# Patient Record
Sex: Female | Born: 1950 | Race: White | Hispanic: No | Smoking: Former smoker
Health system: Southern US, Community
[De-identification: ages and names within clinical notes are randomized; demographics above are authoritative.]

## PROBLEM LIST (undated history)

## (undated) DIAGNOSIS — E119 Type 2 diabetes mellitus without complications: Secondary | ICD-10-CM

## (undated) DIAGNOSIS — Z85820 Personal history of malignant melanoma of skin: Secondary | ICD-10-CM

## (undated) DIAGNOSIS — H269 Unspecified cataract: Secondary | ICD-10-CM

## (undated) DIAGNOSIS — F329 Major depressive disorder, single episode, unspecified: Secondary | ICD-10-CM

## (undated) DIAGNOSIS — K219 Gastro-esophageal reflux disease without esophagitis: Secondary | ICD-10-CM

## (undated) DIAGNOSIS — Z85828 Personal history of other malignant neoplasm of skin: Secondary | ICD-10-CM

## (undated) DIAGNOSIS — E7800 Pure hypercholesterolemia, unspecified: Secondary | ICD-10-CM

## (undated) DIAGNOSIS — E039 Hypothyroidism, unspecified: Secondary | ICD-10-CM

## (undated) DIAGNOSIS — Z8679 Personal history of other diseases of the circulatory system: Secondary | ICD-10-CM

## (undated) DIAGNOSIS — G4734 Idiopathic sleep related nonobstructive alveolar hypoventilation: Secondary | ICD-10-CM

## (undated) DIAGNOSIS — F32A Depression, unspecified: Secondary | ICD-10-CM

## (undated) DIAGNOSIS — Z8659 Personal history of other mental and behavioral disorders: Secondary | ICD-10-CM

## (undated) DIAGNOSIS — R112 Nausea with vomiting, unspecified: Secondary | ICD-10-CM

## (undated) DIAGNOSIS — I10 Essential (primary) hypertension: Secondary | ICD-10-CM

## (undated) DIAGNOSIS — Z9889 Other specified postprocedural states: Secondary | ICD-10-CM

## (undated) DIAGNOSIS — F419 Anxiety disorder, unspecified: Secondary | ICD-10-CM

## (undated) DIAGNOSIS — E079 Disorder of thyroid, unspecified: Secondary | ICD-10-CM

## (undated) DIAGNOSIS — K589 Irritable bowel syndrome without diarrhea: Secondary | ICD-10-CM

## (undated) DIAGNOSIS — Z87898 Personal history of other specified conditions: Secondary | ICD-10-CM

## (undated) DIAGNOSIS — Z8639 Personal history of other endocrine, nutritional and metabolic disease: Secondary | ICD-10-CM

## (undated) HISTORY — PX: EYE SURGERY: SHX253

## (undated) HISTORY — PX: CEREBRAL ANEURYSM REPAIR: SHX164

## (undated) HISTORY — DX: Disorder of thyroid, unspecified: E07.9

## (undated) HISTORY — DX: Gastro-esophageal reflux disease without esophagitis: K21.9

## (undated) HISTORY — PX: TOTAL ABDOMINAL HYSTERECTOMY W/ BILATERAL SALPINGOOPHORECTOMY: SHX83

## (undated) HISTORY — DX: Personal history of other malignant neoplasm of skin: Z85.828

## (undated) HISTORY — DX: Personal history of other diseases of the circulatory system: Z86.79

## (undated) HISTORY — DX: Personal history of other diseases of the circulatory system: Z98.890

## (undated) HISTORY — DX: Personal history of other mental and behavioral disorders: Z86.59

## (undated) HISTORY — DX: Unspecified cataract: H26.9

## (undated) HISTORY — PX: ABDOMINAL HYSTERECTOMY: SHX81

## (undated) HISTORY — DX: Irritable bowel syndrome, unspecified: K58.9

## (undated) HISTORY — DX: Personal history of other endocrine, nutritional and metabolic disease: Z86.39

---
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
## 1990-01-16 HISTORY — PX: MOHS SURGERY: SUR867

## 1997-12-16 HISTORY — PX: OTHER SURGICAL HISTORY: SHX169

## 1998-02-16 HISTORY — PX: FOOT SURGERY: SHX648

## 1998-03-15 ENCOUNTER — Other Ambulatory Visit: Admission: RE | Admit: 1998-03-15 | Discharge: 1998-03-15 | Payer: Self-pay | Admitting: Family Medicine

## 2000-05-21 ENCOUNTER — Other Ambulatory Visit: Admission: RE | Admit: 2000-05-21 | Discharge: 2000-05-21 | Payer: Self-pay | Admitting: Family Medicine

## 2001-06-27 ENCOUNTER — Other Ambulatory Visit: Admission: RE | Admit: 2001-06-27 | Discharge: 2001-06-27 | Payer: Self-pay | Admitting: Family Medicine

## 2002-08-27 ENCOUNTER — Other Ambulatory Visit: Admission: RE | Admit: 2002-08-27 | Discharge: 2002-08-27 | Payer: Self-pay | Admitting: Family Medicine

## 2004-03-24 ENCOUNTER — Ambulatory Visit: Payer: Self-pay | Admitting: Family Medicine

## 2004-03-24 ENCOUNTER — Other Ambulatory Visit: Admission: RE | Admit: 2004-03-24 | Discharge: 2004-03-24 | Payer: Self-pay | Admitting: Family Medicine

## 2004-03-28 ENCOUNTER — Encounter: Payer: Self-pay | Admitting: Family Medicine

## 2004-04-28 ENCOUNTER — Ambulatory Visit: Payer: Self-pay | Admitting: Family Medicine

## 2004-07-05 ENCOUNTER — Ambulatory Visit: Payer: Self-pay | Admitting: Family Medicine

## 2004-07-27 ENCOUNTER — Ambulatory Visit: Payer: Self-pay | Admitting: Family Medicine

## 2004-09-09 ENCOUNTER — Ambulatory Visit: Payer: Self-pay | Admitting: Family Medicine

## 2004-11-07 ENCOUNTER — Ambulatory Visit: Payer: Self-pay | Admitting: *Deleted

## 2004-11-23 ENCOUNTER — Ambulatory Visit: Payer: Self-pay | Admitting: *Deleted

## 2004-12-02 ENCOUNTER — Ambulatory Visit: Payer: Self-pay | Admitting: *Deleted

## 2004-12-09 ENCOUNTER — Ambulatory Visit: Payer: Self-pay | Admitting: *Deleted

## 2004-12-16 ENCOUNTER — Ambulatory Visit: Payer: Self-pay | Admitting: *Deleted

## 2005-01-20 ENCOUNTER — Ambulatory Visit: Payer: Self-pay | Admitting: Family Medicine

## 2005-04-07 ENCOUNTER — Ambulatory Visit: Payer: Self-pay | Admitting: Family Medicine

## 2005-09-07 ENCOUNTER — Ambulatory Visit: Payer: Self-pay | Admitting: Family Medicine

## 2006-07-25 ENCOUNTER — Encounter: Payer: Self-pay | Admitting: Family Medicine

## 2006-07-25 DIAGNOSIS — Z85828 Personal history of other malignant neoplasm of skin: Secondary | ICD-10-CM | POA: Insufficient documentation

## 2006-07-25 DIAGNOSIS — K589 Irritable bowel syndrome without diarrhea: Secondary | ICD-10-CM | POA: Insufficient documentation

## 2006-07-25 DIAGNOSIS — R632 Polyphagia: Secondary | ICD-10-CM | POA: Insufficient documentation

## 2006-07-25 DIAGNOSIS — E039 Hypothyroidism, unspecified: Secondary | ICD-10-CM | POA: Insufficient documentation

## 2006-07-25 DIAGNOSIS — A0472 Enterocolitis due to Clostridium difficile, not specified as recurrent: Secondary | ICD-10-CM | POA: Insufficient documentation

## 2006-07-25 DIAGNOSIS — F411 Generalized anxiety disorder: Secondary | ICD-10-CM | POA: Insufficient documentation

## 2006-07-25 DIAGNOSIS — I10 Essential (primary) hypertension: Secondary | ICD-10-CM | POA: Insufficient documentation

## 2006-07-25 DIAGNOSIS — E1169 Type 2 diabetes mellitus with other specified complication: Secondary | ICD-10-CM | POA: Insufficient documentation

## 2006-07-25 DIAGNOSIS — E785 Hyperlipidemia, unspecified: Secondary | ICD-10-CM | POA: Insufficient documentation

## 2006-07-25 DIAGNOSIS — F418 Other specified anxiety disorders: Secondary | ICD-10-CM | POA: Insufficient documentation

## 2006-08-01 ENCOUNTER — Ambulatory Visit: Payer: Self-pay | Admitting: Family Medicine

## 2006-08-03 LAB — CONVERTED CEMR LAB
ALT: 31 units/L (ref 0–35)
AST: 24 units/L (ref 0–37)
Alkaline Phosphatase: 51 units/L (ref 39–117)
BUN: 10 mg/dL (ref 6–23)
Basophils Relative: 0.7 % (ref 0.0–1.0)
Bilirubin, Direct: 0.1 mg/dL (ref 0.0–0.3)
CO2: 31 meq/L (ref 19–32)
Calcium: 9.2 mg/dL (ref 8.4–10.5)
Chloride: 105 meq/L (ref 96–112)
Cholesterol: 256 mg/dL (ref 0–200)
Eosinophils Relative: 2 % (ref 0.0–5.0)
GFR calc Af Amer: 111 mL/min
Glucose, Bld: 102 mg/dL — ABNORMAL HIGH (ref 70–99)
HDL: 42.9 mg/dL (ref >39.0)
Monocytes Relative: 4.9 % (ref 3.0–11.0)
Platelets: 276 10*3/uL (ref 150–400)
RBC: 4.41 M/uL (ref 3.87–5.11)
RDW: 13.5 % (ref 11.5–14.6)
TSH: 28.56 microintl units/mL — ABNORMAL HIGH (ref 0.35–5.50)
Total CHOL/HDL Ratio: 6
Total Protein: 7.5 g/dL (ref 6.0–8.3)
Triglycerides: 119 mg/dL (ref 0–149)
VLDL: 24 mg/dL (ref 0–40)
WBC: 6.7 10*3/uL (ref 4.5–10.5)

## 2006-08-05 ENCOUNTER — Emergency Department: Payer: Self-pay | Admitting: Unknown Physician Specialty

## 2006-08-05 ENCOUNTER — Other Ambulatory Visit: Payer: Self-pay

## 2006-08-08 ENCOUNTER — Telehealth: Payer: Self-pay | Admitting: Family Medicine

## 2006-08-09 ENCOUNTER — Telehealth (INDEPENDENT_AMBULATORY_CARE_PROVIDER_SITE_OTHER): Payer: Self-pay | Admitting: *Deleted

## 2006-08-09 DIAGNOSIS — I671 Cerebral aneurysm, nonruptured: Secondary | ICD-10-CM | POA: Insufficient documentation

## 2006-08-22 ENCOUNTER — Encounter: Payer: Self-pay | Admitting: Family Medicine

## 2006-08-28 ENCOUNTER — Encounter (INDEPENDENT_AMBULATORY_CARE_PROVIDER_SITE_OTHER): Payer: Self-pay | Admitting: *Deleted

## 2006-12-19 ENCOUNTER — Ambulatory Visit: Payer: Self-pay | Admitting: Family Medicine

## 2007-01-14 ENCOUNTER — Ambulatory Visit: Payer: Self-pay | Admitting: Family Medicine

## 2007-01-14 LAB — CONVERTED CEMR LAB: KOH Prep: NEGATIVE

## 2007-01-15 LAB — CONVERTED CEMR LAB
Cholesterol: 235 mg/dL (ref 0–200)
Direct LDL: 160.7 mg/dL
HDL: 39.8 mg/dL (ref >39.0)
Hgb A1c MFr Bld: 5.9 % (ref 4.6–6.0)
TSH: 14.06 microintl units/mL — ABNORMAL HIGH (ref 0.35–5.50)
Triglycerides: 193 mg/dL — ABNORMAL HIGH (ref 0–149)
VLDL: 39 mg/dL (ref 0–40)

## 2007-02-21 ENCOUNTER — Ambulatory Visit: Payer: Self-pay | Admitting: Family Medicine

## 2007-03-25 ENCOUNTER — Ambulatory Visit: Payer: Self-pay | Admitting: Family Medicine

## 2007-03-26 LAB — CONVERTED CEMR LAB
HDL: 44.2 mg/dL (ref >39.0)
TSH: 5.83 microintl units/mL — ABNORMAL HIGH (ref 0.35–5.50)
Total CHOL/HDL Ratio: 3.9
Triglycerides: 124 mg/dL (ref 0–149)

## 2007-05-22 ENCOUNTER — Ambulatory Visit: Payer: Self-pay | Admitting: Family Medicine

## 2007-05-22 DIAGNOSIS — Z87891 Personal history of nicotine dependence: Secondary | ICD-10-CM | POA: Insufficient documentation

## 2007-05-22 DIAGNOSIS — E119 Type 2 diabetes mellitus without complications: Secondary | ICD-10-CM | POA: Insufficient documentation

## 2007-09-26 ENCOUNTER — Ambulatory Visit: Payer: Self-pay | Admitting: Family Medicine

## 2007-09-27 LAB — CONVERTED CEMR LAB: TSH: 0.19 microintl units/mL — ABNORMAL LOW (ref 0.35–5.50)

## 2007-10-14 ENCOUNTER — Telehealth: Payer: Self-pay | Admitting: Family Medicine

## 2007-10-30 ENCOUNTER — Ambulatory Visit: Payer: Self-pay | Admitting: Family Medicine

## 2007-11-01 ENCOUNTER — Telehealth: Payer: Self-pay | Admitting: Family Medicine

## 2008-03-16 ENCOUNTER — Ambulatory Visit: Payer: Self-pay | Admitting: Family Medicine

## 2008-03-17 LAB — CONVERTED CEMR LAB
ALT: 34 units/L (ref 0–35)
AST: 27 units/L (ref 0–37)
Albumin: 4 g/dL (ref 3.5–5.2)
BUN: 23 mg/dL (ref 6–23)
CO2: 30 meq/L (ref 19–32)
Chloride: 104 meq/L (ref 96–112)
Cholesterol: 183 mg/dL (ref 0–200)
Creatinine, Ser: 0.7 mg/dL (ref 0.4–1.2)
GFR calc Af Amer: 110 mL/min
Glucose, Bld: 133 mg/dL — ABNORMAL HIGH (ref 70–99)
HDL: 36.6 mg/dL — ABNORMAL LOW (ref >39.0)
TSH: 2.29 microintl units/mL (ref 0.35–5.50)
Triglycerides: 178 mg/dL — ABNORMAL HIGH (ref 0–149)

## 2008-03-23 ENCOUNTER — Ambulatory Visit: Payer: Self-pay | Admitting: Family Medicine

## 2008-04-22 ENCOUNTER — Telehealth: Payer: Self-pay | Admitting: Family Medicine

## 2008-07-09 ENCOUNTER — Ambulatory Visit: Payer: Self-pay | Admitting: Family Medicine

## 2008-07-12 LAB — CONVERTED CEMR LAB
Albumin: 4.1 g/dL (ref 3.5–5.2)
Creatinine,U: 306.6 mg/dL
Glucose, Bld: 122 mg/dL — ABNORMAL HIGH (ref 70–99)
Hgb A1c MFr Bld: 6.1 % (ref 4.6–6.5)
Phosphorus: 3.8 mg/dL (ref 2.3–4.6)
Potassium: 3.8 meq/L (ref 3.5–5.1)
Sodium: 142 meq/L (ref 135–145)

## 2008-07-15 ENCOUNTER — Ambulatory Visit: Payer: Self-pay | Admitting: Family Medicine

## 2008-07-15 DIAGNOSIS — R61 Generalized hyperhidrosis: Secondary | ICD-10-CM | POA: Insufficient documentation

## 2008-08-07 ENCOUNTER — Telehealth: Payer: Self-pay | Admitting: Family Medicine

## 2008-08-10 ENCOUNTER — Ambulatory Visit: Payer: Self-pay | Admitting: Family Medicine

## 2008-08-10 ENCOUNTER — Encounter: Admission: RE | Admit: 2008-08-10 | Discharge: 2008-08-10 | Payer: Self-pay | Admitting: Family Medicine

## 2008-08-10 DIAGNOSIS — M545 Low back pain, unspecified: Secondary | ICD-10-CM | POA: Insufficient documentation

## 2008-08-10 DIAGNOSIS — IMO0002 Reserved for concepts with insufficient information to code with codable children: Secondary | ICD-10-CM | POA: Insufficient documentation

## 2008-08-26 ENCOUNTER — Encounter: Payer: Self-pay | Admitting: Family Medicine

## 2008-08-27 ENCOUNTER — Encounter: Payer: Self-pay | Admitting: Family Medicine

## 2008-08-28 ENCOUNTER — Encounter: Payer: Self-pay | Admitting: Family Medicine

## 2008-08-31 ENCOUNTER — Encounter: Payer: Self-pay | Admitting: Family Medicine

## 2008-09-09 ENCOUNTER — Telehealth: Payer: Self-pay | Admitting: Family Medicine

## 2008-09-23 ENCOUNTER — Ambulatory Visit: Payer: Self-pay | Admitting: Family Medicine

## 2008-10-01 ENCOUNTER — Ambulatory Visit: Payer: Self-pay | Admitting: Family Medicine

## 2008-10-01 DIAGNOSIS — Z780 Asymptomatic menopausal state: Secondary | ICD-10-CM | POA: Insufficient documentation

## 2008-10-09 ENCOUNTER — Encounter: Payer: Self-pay | Admitting: Family Medicine

## 2008-10-15 ENCOUNTER — Encounter (INDEPENDENT_AMBULATORY_CARE_PROVIDER_SITE_OTHER): Payer: Self-pay | Admitting: *Deleted

## 2008-11-11 ENCOUNTER — Encounter (INDEPENDENT_AMBULATORY_CARE_PROVIDER_SITE_OTHER): Payer: Self-pay | Admitting: *Deleted

## 2008-12-28 ENCOUNTER — Ambulatory Visit: Payer: Self-pay | Admitting: Family Medicine

## 2008-12-29 LAB — CONVERTED CEMR LAB
ALT: 23 units/L (ref 0–35)
AST: 19 units/L (ref 0–37)
Albumin: 3.9 g/dL (ref 3.5–5.2)
BUN: 18 mg/dL (ref 6–23)
CO2: 29 meq/L (ref 19–32)
Calcium: 9.4 mg/dL (ref 8.4–10.5)
Chloride: 106 meq/L (ref 96–112)
Cholesterol: 184 mg/dL (ref 0–200)
Creatinine, Ser: 0.8 mg/dL (ref 0.4–1.2)
GFR calc non Af Amer: 77.76 mL/min (ref >60)
Glucose, Bld: 163 mg/dL — ABNORMAL HIGH (ref 70–99)
HDL: 45.2 mg/dL (ref >39.00)
Hgb A1c MFr Bld: 5.9 % (ref 4.6–6.5)
LDL Cholesterol: 117 mg/dL — ABNORMAL HIGH (ref 0–99)
Phosphorus: 4 mg/dL (ref 2.3–4.6)
Potassium: 3.6 meq/L (ref 3.5–5.1)
Sodium: 146 meq/L — ABNORMAL HIGH (ref 135–145)
TSH: 0.9 microintl units/mL (ref 0.35–5.50)
Total CHOL/HDL Ratio: 4
Triglycerides: 107 mg/dL (ref 0.0–149.0)
VLDL: 21.4 mg/dL (ref 0.0–40.0)

## 2009-01-13 ENCOUNTER — Ambulatory Visit: Payer: Self-pay | Admitting: Family Medicine

## 2009-01-13 DIAGNOSIS — E669 Obesity, unspecified: Secondary | ICD-10-CM

## 2009-01-13 DIAGNOSIS — E663 Overweight: Secondary | ICD-10-CM | POA: Insufficient documentation

## 2009-01-13 DIAGNOSIS — E6609 Other obesity due to excess calories: Secondary | ICD-10-CM | POA: Insufficient documentation

## 2009-02-11 ENCOUNTER — Telehealth: Payer: Self-pay | Admitting: Family Medicine

## 2009-06-02 ENCOUNTER — Telehealth: Payer: Self-pay | Admitting: Family Medicine

## 2009-06-23 ENCOUNTER — Encounter: Payer: Self-pay | Admitting: Family Medicine

## 2009-07-09 ENCOUNTER — Ambulatory Visit: Payer: Self-pay | Admitting: Family Medicine

## 2009-07-10 LAB — CONVERTED CEMR LAB
ALT: 19 units/L (ref 0–35)
Albumin: 4.5 g/dL (ref 3.5–5.2)
BUN: 13 mg/dL (ref 6–23)
Glucose, Bld: 109 mg/dL — ABNORMAL HIGH (ref 70–99)
HDL: 41.3 mg/dL (ref >39.00)
Phosphorus: 4 mg/dL (ref 2.3–4.6)
Total CHOL/HDL Ratio: 3
VLDL: 16.4 mg/dL (ref 0.0–40.0)

## 2009-08-04 ENCOUNTER — Ambulatory Visit: Payer: Self-pay | Admitting: Family Medicine

## 2009-09-13 ENCOUNTER — Telehealth: Payer: Self-pay | Admitting: Family Medicine

## 2009-09-13 DIAGNOSIS — G4730 Sleep apnea, unspecified: Secondary | ICD-10-CM | POA: Insufficient documentation

## 2009-11-05 ENCOUNTER — Encounter: Payer: Self-pay | Admitting: Family Medicine

## 2010-01-04 ENCOUNTER — Ambulatory Visit: Payer: Self-pay | Admitting: Family Medicine

## 2010-01-04 LAB — CONVERTED CEMR LAB
ALT: 21 units/L (ref 0–35)
AST: 20 units/L (ref 0–37)
BUN: 27 mg/dL — ABNORMAL HIGH (ref 6–23)
Creatinine, Ser: 0.8 mg/dL (ref 0.4–1.2)
GFR calc non Af Amer: 77.5 mL/min (ref >60.00)
Hgb A1c MFr Bld: 5.5 % (ref 4.6–6.5)
LDL Cholesterol: 90 mg/dL (ref 0–99)
Phosphorus: 3 mg/dL (ref 2.3–4.6)
Total CHOL/HDL Ratio: 3

## 2010-01-12 ENCOUNTER — Ambulatory Visit: Payer: Self-pay | Admitting: Family Medicine

## 2010-02-15 NOTE — Assessment & Plan Note (Signed)
Summary: 6 MONTH FOLLOW UP/RBH   Vital Signs:  Patient profile:   60 year old female Height:      61.75 inches Weight:      178.75 pounds BMI:     33.08 Temp:     98 degrees F oral Pulse rate:   68 / minute Pulse rhythm:   regular BP sitting:   110 / 72  (left arm) Cuff size:   large  Vitals Entered By: Lewanda Rife LPN (August 04, 2009 4:11 PM) CC: six month f/u after labs   History of Present Illness: here for f/u of HTN and lipids and hyperglycemia   has lost 32 lb since last visit 6 months ago! started eating healthy with her daughter -- more fruit and veg  no more junk food  feels better  by her scales 40 lb total  feels like a new person -- also has a new part time job at [employer]    has really found balance  is not exercising yet   does try to park farther so she will walk   today is 3 year anniv of smoking cessation  watching her wide neck aneurysm -- tried to stent it (unsuccessful)- but was able to coil it and tried again     bp is great at 110/72  lipids are better with trig 82 and HDL 41 and LDL 85 (down frm 117)  AIC is 5.5 down from 5.9  is no longer caring for her mother - was in rehab and now home takes her to dialysis 3 times per week   was told she may need a sleep study - got hypoxic during angiogram  does snore - to the best of her knowledge- wakes her self up and throat is dry  wakes up refreshed       Allergies: 1)  Codeine  Past History:  Past Medical History: Last updated: 03/23/2008 Anxiety Depression Hyperlipidemia Hypertension Hypothyroidism Skin cancer, hx of- melanoma left lower arm, recurrance  cerebral aneurysm quit smoking 08 hyperglycemia (? early DM)  Past Surgical History: Last updated: 12/19/2006 Appendectomy Hysterectomy/ BSO Tonsillectomy Pos EKG (12/1997) Cardiolite- normal EF 68% (12/1997 Eye surgery RR Foot surgery- bilateral (02/1998) Dexa- normal (05/2000) Colonoscopy- normal (05/2002) CT  sinus- left sinusitis (10/2002) Thyroid US- neg (03/206) cerebral aneurysm  Family History: Last updated: 10/01/2008 Father: diabetes, depression, CAD Mother: diabetes, diverticulosis, adrenal problems Siblings:  breast ca in distant relatives- some of her aunts and cousins - all on mother's side  sister cerebral aneurysm  Social History: Last updated: 10/01/2008 Marital Status: divorced  Children: 1 daughter 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 Eyes:  Denies blurring and eye irritation. CV:  Denies chest pain or discomfort, lightheadness, palpitations, and shortness of breath with exertion. Resp:  Denies cough, shortness of breath, and wheezing. GI:  Denies abdominal pain, change in bowel habits, indigestion, and nausea. GU:  Denies urinary frequency. MS:  Denies muscle aches and cramps. Derm:  Denies itching, lesion(s), poor wound healing, and rash. Neuro:  Denies numbness and tingling. Psych:  mood is better - had some med changes for that. Endo:  Denies cold intolerance, excessive thirst, excessive urination, and heat intolerance. Heme:  Denies abnormal bruising and bleeding.  Physical Exam  General:  overweight but generally well appearing moderate wt loss noted  Head:  normocephalic, atraumatic, and no  abnormalities observed.   Eyes:  vision grossly intact, pupils equal, pupils round, and pupils reactive to light.  .conj Mouth:  pharynx pink and moist.   Neck:  supple with full rom and no masses or thyromegally, no JVD or carotid bruit  Chest Wall:  No deformities, masses, or tenderness noted. Lungs:  Normal respiratory effort, chest expands symmetrically. Lungs are clear to auscultation, no crackles or wheezes. Heart:  Normal rate and regular rhythm. S1 and S2 normal without gallop, murmur, click, rub or other extra  sounds. Abdomen:  Bowel sounds positive,abdomen soft and non-tender without masses, organomegaly or hernias noted. no renal bruits  Msk:  No deformity or scoliosis noted of thoracic or lumbar spine.   Pulses:  R and L carotid,radial,femoral,dorsalis pedis and posterior tibial pulses are full and equal bilaterally Extremities:  No clubbing, cyanosis, edema, or deformity noted with normal full range of motion of all joints.   Neurologic:  sensation intact to light touch, gait normal, and DTRs symmetrical and normal.   Skin:  Intact without suspicious lesions or rashes Cervical Nodes:  No lymphadenopathy noted Inguinal Nodes:  No significant adenopathy Psych:  normal affect, talkative and pleasant - is quite cheerful today  Diabetes Management Exam:    Foot Exam (with socks and/or shoes not present):       Sensory-Pinprick/Light touch:          Left medial foot (L-4): normal          Left dorsal foot (L-5): normal          Left lateral foot (S-1): normal          Right medial foot (L-4): normal          Right dorsal foot (L-5): normal          Right lateral foot (S-1): normal       Sensory-Monofilament:          Left foot: normal          Right foot: normal       Inspection:          Left foot: normal          Right foot: normal       Nails:          Left foot: normal          Right foot: normal   Impression & Recommendations:  Problem # 1:  HYPERGLYCEMIA (ICD-790.29) Assessment Improved  this continues to improve with AIC 5.5 and 40 lb wt loss  enc her to keep up the good work made plan to start some indoor exercise   Labs Reviewed: Creat: 0.8 (07/09/2009)     Problem # 2:  OBESITY (ICD-278.00) Assessment: Improved much imp with sensible eating and loss of 40 lb enc her to keep up the great work  Problem # 3:  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 Take 1 by mouth once daily    Hydrochlorothiazide 25 Mg Tabs (Hydrochlorothiazide) .Marland Kitchen... Take one by mouth daily  BP today: 110/72 Prior BP: 120/80 (01/13/2009)  Labs Reviewed: K+: 4.8 (07/09/2009) Creat: : 0.8 (07/09/2009)   Chol: 143 (07/09/2009)   HDL:  41.30 (07/09/2009)   LDL: 85 (07/09/2009)   TG: 82.0 (07/09/2009)  Problem # 5:  HYPERLIPIDEMIA (ICD-272.4) Assessment: Improved  this is imp / at goal with LDL uner 100  rev low sat fat diet - much better continue the pravachol  plan lab and f/u 6 mo  Her updated medication list for this problem includes:    Pravachol 20 Mg Tabs (Pravastatin sodium) .Marland Kitchen... 1 by mouth once daily  Labs Reviewed: SGOT: 25 (07/09/2009)   SGPT: 19 (07/09/2009)   HDL:41.30 (07/09/2009), 45.20 (12/28/2008)  LDL:85 (07/09/2009), 117 (16/10/9602)  Chol:143 (07/09/2009), 184 (12/28/2008)  Trig:82.0 (07/09/2009), 107.0 (12/28/2008)  Complete Medication List: 1)  Inderal La 80 Mg Cp24 (Propranolol hcl) .... Take 1 by mouth once daily 2)  Hydrochlorothiazide 25 Mg Tabs (Hydrochlorothiazide) .... Take one by mouth daily 3)  Lexapro 20 Mg Tabs (Escitalopram oxalate) .... Take one and one half q am 4)  Wellbutrin Xl 300 Mg Tb24 (Bupropion hcl) .... Take one by mouth daily with wellbutrin xl 150mg  to total 450mg  daily 5)  Levoxyl 150 Mcg Tabs (Levothyroxine sodium) .... One by mouth daily 6)  Pravachol 20 Mg Tabs (Pravastatin sodium) .Marland Kitchen.. 1 by mouth once daily 7)  Aspirin 325 Mg Tabs (Aspirin) .... Take one by mouth daily 8)  Diclofenac Sodium 75 Mg Tbec (Diclofenac sodium) .... Takle 1 by mouth two times a day as needed. 9)  Plavix 75 Mg Tabs (Clopidogrel bisulfate) .... Take 1 tablet by mouth once a day 10)  Wellbutrin Sr 150 Mg Xr12h-tab  (Bupropion hcl) .... Take one by mouth daily with wellbutrin xl 300mg  to total 450mg  daily  Patient Instructions: 1)  weight loss is great  2)  cholesterol and bp and sugar control are all improved  3)  keep up the good work and start adding in some exercise (like an indoor video)  4)  schedule fasting labs and then follow up in 6 months lipid/ast/alt/renal / AIC/ tsh 272, hyperglycemia , 244.9  Current Allergies (reviewed today): CODEINE

## 2010-02-15 NOTE — Consult Note (Signed)
Summary: [facility]  [facility]   Imported By: Maryln Gottron 11/18/2009 14:43:33  _____________________________________________________________________  External Attachment:    Type:   Image     Comment:   External Document

## 2010-02-15 NOTE — Progress Notes (Signed)
Summary: refill requests from [facility]  Phone Note Refill Request Message from:  Fax from Pharmacy  Refills Requested: Medication #1:  INDERAL LA 80 MG  CP24 take 1 by mouth once daily  Medication #2:  HYDROCHLOROTHIAZIDE 25 MG  TABS take one by mouth daily  Medication #3:  LEVOXYL 150 MCG TABS one by mouth daily Faxed forms from [facility] are on your shelf.  Initial call taken by: Lowella Petties CMA,  Jun 02, 2009 11:59 AM  Follow-up for Phone Call        form done and in nurse in box   Follow-up by: Judith Part MD,  Jun 02, 2009 1:27 PM  Additional Follow-up for Phone Call Additional follow up Details #1::        completed forms faxed to [phone] as instructed. Forms are at Rena's desk if needed later.Lewanda Rife LPN  Jun 02, 2009 4:02 PM     New/Updated Medications: INDERAL LA 80 MG  CP24 (PROPRANOLOL HCL) take 1 by mouth once daily Prescriptions: LEVOXYL 150 MCG TABS (LEVOTHYROXINE SODIUM) one by mouth daily  #90 x 3   Entered and Authorized by:   Judith Part MD   Signed by:   Lewanda Rife LPN on 09/81/1914   Method used:   Historical   RxID:   [id number] HYDROCHLOROTHIAZIDE 25 MG  TABS (HYDROCHLOROTHIAZIDE) take one by mouth daily  #90 x 3   Entered and Authorized by:   Judith Part MD   Signed by:   Lewanda Rife LPN on 69/62/9528   Method used:   Historical   RxID:   [id number] INDERAL LA 80 MG  CP24 (PROPRANOLOL HCL) take 1 by mouth once daily  #90 x 3   Entered and Authorized by:   Judith Part MD   Signed by:   Lewanda Rife LPN on 44/03/4740   Method used:   Historical   RxID:   [id number]

## 2010-02-15 NOTE — Progress Notes (Signed)
Summary: rt hip pain  Phone Note Call from Patient Call back at [phone]   Caller: Patient Call For: Dr. Patsy Lager Summary of Call: Right hip pain is much better than when saw Dr Patsy Lager on 08/10/08. Pt fell on rt hip in October and pt did not get checked but rt hip is starting to bother her more and pt has refill on Diclofenac Na which worked well for pt. but  pt. wanted to ask Dr. Patsy Lager if a new med had become available since she was given the Diclofenac NA in July that might work faster. [facility] uses [facility]. [phone] needed. Please advise.  Initial call taken by: Lewanda Rife LPN,  February 11, 2009 9:38 AM  Follow-up for Phone Call        many meds on the market. none new that i would use ahead of the 59 old ones that work well.  ok to call in Voltaren 75 mg, 1 by mouth two times a day, #60, 2 refills  f/u if not better Follow-up by: Hannah Beat MD,  February 11, 2009 9:45 AM  Additional Follow-up for Phone Call Additional follow up Details #1::        Med sent [facility] to [facility] and Patient notified as instructed by telephone. Lewanda Rife LPN  February 11, 2009 10:46 AM     New/Updated Medications: DICLOFENAC SODIUM 75 MG TBEC (DICLOFENAC SODIUM) takle 1 by mouth two times a day Prescriptions: DICLOFENAC SODIUM 75 MG TBEC (DICLOFENAC SODIUM) takle 1 by mouth two times a day  #60 x 2   Entered by:   Lewanda Rife LPN   Authorized by:   Hannah Beat MD   Signed by:   Lewanda Rife LPN on 54/27/0623   Method used:   Electronically to        [facility] [phone]* (retail)       [address] [place], [place]  [address]       Ph: [phone] or [phone]       Fax: [phone]   RxID:   [id number]

## 2010-02-15 NOTE — Progress Notes (Signed)
Summary: sleep study referral  Phone Note Call from Patient Call back at Home Phone [phone]   Caller: Patient Call For: Judith Part MD Summary of Call: Patient is calling stating that she had discussed w/ you at last appt. having a sleep study and that she hasn't heard anything back. Please advise. Prefers [employer].  Initial call taken by: Melody Comas,  September 13, 2009 11:15 AM  Follow-up for Phone Call        thank her for calling back we disc so many issues that day the ref did not get done - my fault  will do ref now for [facility] Follow-up by: Judith Part MD,  September 13, 2009 11:37 AM  Additional Follow-up for Phone Call Additional follow up Details #1::        Patient notified as instructed by telephone. Pt will wait to hear from [facility] about appt.Lewanda Rife LPN  September 13, 2009 12:38 PM   New Problems: SLEEP APNEA (ICD-780.57)   Additional Follow-up for Phone Call Additional follow up Details #2::    ApptDr Leveda Anna Pulm on 09/23/2009 at 11:00am. Follow-up by: Carlton Adam,  September 15, 2009 11:03 AM  New Problems: SLEEP APNEA (ICD-780.57)

## 2010-02-17 NOTE — Assessment & Plan Note (Signed)
Summary: ROA FOR 6 MONTH FOLLOW-UP/JRR   Vital Signs:  Patient profile:   60 year old female Height:      61.75 inches Weight:      191.75 pounds BMI:     35.48 Temp:     97.8 degrees F oral Pulse rate:   72 / minute Pulse rhythm:   regular BP sitting:   112 / 72  (left arm) Cuff size:   large  Vitals Entered By: Lewanda Rife LPN (January 12, 2010 9:03 AM) CC: six month f/u   History of Present Illness: here for 6 month f/u of hyperglycemia and lipids and HTN and hypothyroidism  is glad the holidays are over  feels great  saw Dr Mayo Ao and did sleep study  he put her on 02 at night -- and feels better  no apnea and also nl pfts   has f/u for her aneurysm -- coiling -- done more (unable to use stent) has f/u in 2 weeks   wt is up 13 lb with bmi of 35 thinks real wt is 186  got off track over the holidays -- and then had some bad headaches after the coiling -- thank goodness that is better now  had to spend a few weeks in bed  is not back on track with diet - but made a plan  is cleared to exercise -- after her next check up - plans to walk   is doing so much better with emotional eating -- with more energy  doing more so she does not eat  was a boredom eater  also resolved issues with her ex husband   bp great control at 112/72  tsh is stable  hypothyroid  lipids are good with trig 48 and HDL 52 and LDL 90  (HDL is up )  AIC is 5.5-- this is stable from last time  Allergies: 1)  Codeine  Past History:  Past Surgical History: Last updated: 12/19/2006 Appendectomy Hysterectomy/ BSO Tonsillectomy Pos EKG (12/1997) Cardiolite- normal EF 68% (12/1997 Eye surgery RR Foot surgery- bilateral (02/1998) Dexa- normal (05/2000) Colonoscopy- normal (05/2002) CT sinus- left sinusitis (10/2002) Thyroid US- neg (03/206) cerebral aneurysm  Family History: Last updated: 10/01/2008 Father: diabetes, depression, CAD Mother: diabetes, diverticulosis, adrenal  problems Siblings:  breast ca in distant relatives- some of her aunts and cousins - all on mother's side  sister cerebral aneurysm  Social History: Last updated: 01/12/2010 Marital Status: divorced  Children: 1 daughter 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 Eyes:  Denies blurring and eye irritation. CV:  Denies chest pain or discomfort and palpitations. Resp:  Denies cough, shortness of breath, and wheezing. GI:  Denies abdominal pain, change in bowel habits, and indigestion. GU:  Denies dysuria and urinary frequency. MS:  Denies joint pain. Derm:  Denies itching, lesion(s), poor wound healing, and rash. Neuro:  Complains of headaches; denies numbness and tingling. Psych:  Denies anxiety and depression. Endo:  Denies cold intolerance, excessive  thirst, excessive urination, and heat intolerance. Heme:  Denies abnormal bruising and bleeding.  Physical Exam  General:  overweight but generally well appearing moderate wt loss noted  Head:  normocephalic, atraumatic, and no abnormalities observed.   Eyes:  vision grossly intact, pupils equal, pupils round, and pupils reactive to light.   Mouth:  pharynx pink and moist.   Neck:  supple with full rom and no masses or thyromegally, no JVD or carotid bruit  Chest Wall:  No deformities, masses, or tenderness noted. Lungs:  Normal respiratory effort,  chest expands symmetrically. Lungs are clear to auscultation, no crackles or wheezes. Heart:  Normal rate and regular rhythm. S1 and S2 normal without gallop, murmur, click, rub or other extra sounds. Abdomen:  Bowel sounds positive,abdomen soft and non-tender without masses, organomegaly or hernias noted. no renal bruits  Msk:  No deformity or scoliosis noted of thoracic or lumbar spine.   Extremities:  No clubbing, cyanosis, edema, or deformity noted with normal full range of motion of all joints.   Neurologic:  sensation intact to light touch, gait normal, and DTRs symmetrical and normal.   Skin:  Intact without suspicious lesions or rashes Cervical Nodes:  No lymphadenopathy noted Inguinal Nodes:  No significant adenopathy Psych:  normal affect, talkative and pleasant    Impression & Recommendations:  Problem # 1:  HYPERGLYCEMIA (ICD-790.29) Assessment Unchanged  this is stable despite wt gain disc healthy diet (low simple sugar/ choose complex carbs/ low sat fat) diet and exercise in detail  lab and PE in 6 mo planned  Labs Reviewed: Creat: 0.8 (01/04/2010)     Problem # 2:  CEREBRAL ANEURYSM (ICD-437.3) Assessment: Comment Only doing well after 2nd coiling  will be released to exercise soon  Problem # 3:  HYPOTHYROIDISM (ICD-244.9) Assessment: Unchanged  no clinical changes and stable tsh  no change in dose Her updated medication list for this problem includes:    Levoxyl 150 Mcg Tabs (Levothyroxine sodium) ..... One by mouth daily  Labs Reviewed: TSH: 0.40 (01/04/2010)    HgBA1c: 5.5 (01/04/2010) Chol: 152 (01/04/2010)   HDL: 52.00 (01/04/2010)   LDL: 90 (01/04/2010)   TG: 48.0 (01/04/2010)  Problem # 4:  HYPERLIPIDEMIA (ICD-272.4) Assessment: Unchanged  this is well controlled with pravachol and diet  rev low sat fat diet  Her updated medication list for this problem includes:    Pravachol 20 Mg Tabs (Pravastatin sodium) .Marland Kitchen... 1 by mouth once daily  Labs  Reviewed: SGOT: 20 (01/04/2010)   SGPT: 21 (01/04/2010)   HDL:52.00 (01/04/2010), 41.30 (07/09/2009)  LDL:90 (01/04/2010), 85 (07/09/2009)  Chol:152 (01/04/2010), 143 (07/09/2009)  Trig:48.0 (01/04/2010), 82.0 (07/09/2009)  Problem # 5:  DEPRESSION (ICD-311) Assessment: Improved doing very well with this medication - great attitude and less emotional eating will continue psychiatry f/u and also current meds  Her updated medication list for this problem includes:    Lexapro 20 Mg Tabs (Escitalopram oxalate) .Marland Kitchen... Take one and one half q am    Wellbutrin Xl 300 Mg Tb24 (Bupropion hcl) .Marland Kitchen... Take one by mouth daily with wellbutrin xl 150mg  to total 450mg  daily    Wellbutrin Sr 150 Mg Xr12h-tab (Bupropion hcl) .Marland Kitchen... Take one by mouth daily with wellbutrin xl 300mg  to total 450mg  daily  Complete Medication List: 1)  Inderal La 80 Mg Cp24 (Propranolol hcl) .... Take 1 by mouth once daily 2)  Hydrochlorothiazide 25 Mg Tabs (Hydrochlorothiazide) .... Take one by mouth daily 3)  Lexapro 20 Mg Tabs (Escitalopram oxalate) .Marland KitchenMarland KitchenMarland Kitchen  Take one and one half q am 4)  Wellbutrin Xl 300 Mg Tb24 (Bupropion hcl) .... Take one by mouth daily with wellbutrin xl 150mg  to total 450mg  daily 5)  Levoxyl 150 Mcg Tabs (Levothyroxine sodium) .... One by mouth daily 6)  Pravachol 20 Mg Tabs (Pravastatin sodium) .Marland Kitchen.. 1 by mouth once daily 7)  Aspirin 325 Mg Tabs (Aspirin) .... Take one by mouth daily 8)  Diclofenac Sodium 75 Mg Tbec (Diclofenac sodium) .... Takle 1 by mouth two times a day as needed. 9)  Plavix 75 Mg Tabs (Clopidogrel bisulfate) .... Take 1 tablet by mouth once a day 10)  Wellbutrin Sr 150 Mg Xr12h-tab (Bupropion hcl) .... Take one by mouth daily with wellbutrin xl 300mg  to total 450mg  daily 11)  O2  .... 2 liters at night  Patient Instructions: 1)  keep working on healthy diet and exercise for weight loss 2)  numbers all look stable  3)  no change in medicine  4)  schedule fasting labs and then f/u for  PE in about 6 months  5)  lipid/ AIC/ renal / hepatic/ cbc with diff/ tsh  for 272, 401.1 and 244.9   Orders Added: 1)  Est. Patient Level IV [16109]    Current Allergies (reviewed today): CODEINE

## 2010-06-06 ENCOUNTER — Other Ambulatory Visit: Payer: Self-pay | Admitting: *Deleted

## 2010-06-06 MED ORDER — PRAVASTATIN SODIUM 20 MG PO TABS: 20.0000 mg | ORAL_TABLET | Freq: Every day | ORAL | Status: DC

## 2010-06-06 NOTE — Telephone Encounter (Signed)
Pravastatin refill printed in error. Redone and sent electronically

## 2010-07-08 ENCOUNTER — Other Ambulatory Visit: Payer: Self-pay | Admitting: Dermatology

## 2010-07-26 ENCOUNTER — Other Ambulatory Visit: Payer: Self-pay | Admitting: *Deleted

## 2010-07-26 MED ORDER — LEVOTHYROXINE SODIUM 150 MCG PO TABS: 150.0000 ug | ORAL_TABLET | Freq: Every day | ORAL | Status: DC

## 2010-07-26 NOTE — Telephone Encounter (Signed)
Faxed form from [facility], form faxed back to them on 07/25/10 authorizing refills.

## 2010-07-27 ENCOUNTER — Other Ambulatory Visit: Payer: Self-pay | Admitting: *Deleted

## 2010-07-27 MED ORDER — HYDROCHLOROTHIAZIDE 25 MG PO TABS: 25.0000 mg | ORAL_TABLET | Freq: Every day | ORAL | Status: DC

## 2010-07-27 MED ORDER — PROPRANOLOL HCL 80 MG PO CP24: 80.0000 mg | ORAL_CAPSULE | Freq: Every day | ORAL | Status: DC

## 2011-04-19 ENCOUNTER — Other Ambulatory Visit: Payer: Self-pay

## 2011-04-19 MED ORDER — LEVOTHYROXINE SODIUM 150 MCG PO TABS: 150.0000 ug | ORAL_TABLET | Freq: Every day | ORAL | Status: DC

## 2011-04-19 MED ORDER — HYDROCHLOROTHIAZIDE 25 MG PO TABS: 25.0000 mg | ORAL_TABLET | Freq: Every day | ORAL | Status: DC

## 2011-04-19 MED ORDER — PROPRANOLOL HCL ER 80 MG PO CP24: 80.0000 mg | ORAL_CAPSULE | Freq: Every day | ORAL | Status: DC

## 2011-04-19 NOTE — Telephone Encounter (Signed)
Please go ahead and fill those - and talk to the pharmacist about that 3rd med -- ? If they have a diff strength or do I have to go with different product? -thanks

## 2011-04-19 NOTE — Telephone Encounter (Signed)
Pt called and accidentally threw away her meds and has already requested refill from [facility] but needs one week sent to [facility] on [address] HCTZ 25 mg #7 x0 and Levothyroxine 150 mcg # 7 x 0 already sent to [facility] but when tried to order Propranolol 80 mg 24 hr got message no longer available for ordering and need new order with different med record.Please advise. Pt already scheduled CPX with Dr Milinda Antis 06/07/11.

## 2011-04-19 NOTE — Telephone Encounter (Signed)
Spoke with pharmacist at [facility] and he stated that they do have Propranolol available and it is not on back order, gave verbal Rx order over the phone, patient advised via message left on cell phone voicemail.

## 2011-04-26 ENCOUNTER — Telehealth: Payer: Self-pay | Admitting: *Deleted

## 2011-04-26 NOTE — Telephone Encounter (Signed)
I'm fine with this - but please let pt know it is going to happen , and change med list as appropriate thanks

## 2011-04-26 NOTE — Telephone Encounter (Signed)
Received a call from [facility] with [facility] stating that Levoxyl is on backorder and they don't know when it will be available.  Can they switch patient to Synthroid ?  Please advise.  Ref# [id number].

## 2011-04-27 MED ORDER — LEVOTHYROXINE SODIUM 150 MCG PO TABS: 150.0000 ug | ORAL_TABLET | Freq: Every day | ORAL | Status: DC

## 2011-04-27 NOTE — Telephone Encounter (Signed)
Spoke with patient and she is ok with the change, med list updated, Rx called to [facility] at [phone] and a 7 day supply was sent electronically to [facility] per patients request.

## 2011-05-31 ENCOUNTER — Telehealth: Payer: Self-pay | Admitting: Family Medicine

## 2011-05-31 ENCOUNTER — Other Ambulatory Visit (INDEPENDENT_AMBULATORY_CARE_PROVIDER_SITE_OTHER): Payer: Self-pay

## 2011-05-31 DIAGNOSIS — R7309 Other abnormal glucose: Secondary | ICD-10-CM

## 2011-05-31 DIAGNOSIS — E785 Hyperlipidemia, unspecified: Secondary | ICD-10-CM

## 2011-05-31 DIAGNOSIS — E039 Hypothyroidism, unspecified: Secondary | ICD-10-CM

## 2011-05-31 DIAGNOSIS — I10 Essential (primary) hypertension: Secondary | ICD-10-CM

## 2011-05-31 DIAGNOSIS — Z0000 Encounter for general adult medical examination without abnormal findings: Secondary | ICD-10-CM

## 2011-05-31 LAB — CBC WITH DIFFERENTIAL/PLATELET
Basophils Absolute: 0 10*3/uL (ref 0.0–0.1)
Basophils Relative: 0.8 % (ref 0.0–3.0)
Eosinophils Relative: 1.1 % (ref 0.0–5.0)
HCT: 38.8 % (ref 36.0–46.0)
Hemoglobin: 13 g/dL (ref 12.0–15.0)
Lymphocytes Relative: 35.1 % (ref 12.0–46.0)
Lymphs Abs: 1.9 10*3/uL (ref 0.7–4.0)
Monocytes Relative: 8.2 % (ref 3.0–12.0)
Neutro Abs: 3 10*3/uL (ref 1.4–7.7)
RBC: 4.24 Mil/uL (ref 3.87–5.11)
RDW: 14 % (ref 11.5–14.6)

## 2011-05-31 LAB — LIPID PANEL
LDL Cholesterol: 93 mg/dL (ref 0–99)
VLDL: 34.6 mg/dL (ref 0.0–40.0)

## 2011-05-31 LAB — COMPREHENSIVE METABOLIC PANEL
ALT: 21 U/L (ref 0–35)
Albumin: 3.8 g/dL (ref 3.5–5.2)
BUN: 23 mg/dL (ref 6–23)
CO2: 27 mEq/L (ref 19–32)
Chloride: 108 mEq/L (ref 96–112)
Creatinine, Ser: 0.6 mg/dL (ref 0.4–1.2)
Glucose, Bld: 139 mg/dL — ABNORMAL HIGH (ref 70–99)
Total Bilirubin: 0.4 mg/dL (ref 0.3–1.2)
Total Protein: 6.9 g/dL (ref 6.0–8.3)

## 2011-05-31 LAB — TSH: TSH: 1.2 u[IU]/mL (ref 0.35–5.50)

## 2011-05-31 NOTE — Telephone Encounter (Signed)
Message copied by Judy Pimple on Wed May 31, 2011  8:17 AM ------      Message from: Alvina Chou      Created: Wed May 31, 2011  8:05 AM       Patient is scheduled for CPX labs, please order future labs, Thanks , Camelia Eng

## 2011-06-07 ENCOUNTER — Ambulatory Visit (INDEPENDENT_AMBULATORY_CARE_PROVIDER_SITE_OTHER): Payer: Self-pay | Admitting: Family Medicine

## 2011-06-07 ENCOUNTER — Encounter: Payer: Self-pay | Admitting: Family Medicine

## 2011-06-07 VITALS — BP 112/70 | HR 76 | Temp 97.7°F | Ht 61.75 in | Wt 210.0 lb

## 2011-06-07 DIAGNOSIS — R7309 Other abnormal glucose: Secondary | ICD-10-CM

## 2011-06-07 DIAGNOSIS — E039 Hypothyroidism, unspecified: Secondary | ICD-10-CM

## 2011-06-07 DIAGNOSIS — Z0000 Encounter for general adult medical examination without abnormal findings: Secondary | ICD-10-CM

## 2011-06-07 DIAGNOSIS — E785 Hyperlipidemia, unspecified: Secondary | ICD-10-CM

## 2011-06-07 DIAGNOSIS — I10 Essential (primary) hypertension: Secondary | ICD-10-CM

## 2011-06-07 DIAGNOSIS — Z1231 Encounter for screening mammogram for malignant neoplasm of breast: Secondary | ICD-10-CM | POA: Insufficient documentation

## 2011-06-07 MED ORDER — LEVOTHYROXINE SODIUM 150 MCG PO TABS: 150.0000 ug | ORAL_TABLET | Freq: Every day | ORAL | Status: DC

## 2011-06-07 MED ORDER — PRAVASTATIN SODIUM 20 MG PO TABS: 20.0000 mg | ORAL_TABLET | Freq: Every day | ORAL | Status: DC

## 2011-06-07 MED ORDER — HYDROCHLOROTHIAZIDE 25 MG PO TABS: 25.0000 mg | ORAL_TABLET | Freq: Every day | ORAL | Status: DC

## 2011-06-07 MED ORDER — PROPRANOLOL HCL ER 80 MG PO CP24: 80.0000 mg | ORAL_CAPSULE | Freq: Every day | ORAL | Status: DC

## 2011-06-07 NOTE — Assessment & Plan Note (Signed)
tsh is theraputic and no clinical changes  Med refilled

## 2011-06-07 NOTE — Assessment & Plan Note (Signed)
Scheduled annual screening mammogram Nl breast exam today  Encouraged monthly self exams   

## 2011-06-07 NOTE — Assessment & Plan Note (Signed)
bp in fair control at this time  No changes needed  Disc lifstyle change with low sodium diet and exercise   

## 2011-06-07 NOTE — Patient Instructions (Signed)
Work hard on diet and exercise and weight loss- stick with weight watchers Schedule follow up in about 3 months with lab prior If you are interested in a shingles/zoster vaccine - call your insurance to check on coverage,( you should not get it within 1 month of other vaccines) , then call us for a prescription  for it to take to a pharmacy that gives the shot  Please call [facility] for last dexa  We will schedule mammogram at check out  No change in medicines

## 2011-06-07 NOTE — Assessment & Plan Note (Signed)
Disc goals for lipids and reasons to control them Rev labs with pt Rev low sat fat diet in detail  Up a bit with worse diet- will work on that and r echeck 3 mo  On pravachol

## 2011-06-07 NOTE — Assessment & Plan Note (Signed)
Reviewed health habits including diet and exercise and skin cancer prevention Also reviewed health mt list, fam hx and immunizations  Sent for last dexa and colonosc  sched mam Disc wt loss -need for  Rev wellness labs in detail

## 2011-06-07 NOTE — Progress Notes (Signed)
Subjective:    Patient ID: Andrea Cooper, female    DOB: [birth year], 61 y.o.   MRN: [id number]  HPI Here for health maintenance exam and to review chronic medical problems   Has been feeling good  Nothing new going on medically   Was supposed to have eyelid surgery- but ins denied it  (was told it would help ha)   Her neurosurgeon moved MRI yearly - last one good   Hypothyroid Lab Results  Component Value Date   TSH 1.20 05/31/2011   Clinically-no change with the way she feels/ skin / hair -but had gained weight  Wt is up 19 lb  bmi is 38 She did join weight watchers this week   Mother fell and broke hip 2 wk ago- at [employer] place - and her stress of caring for her has less so actually eating less and doing better  Is doing a bit of walking   a1c 6.1 up from 5.5 Hyperglycemia   BP Readings from Last 3 Encounters:  01/12/10 112/72  08/04/09 110/72  01/13/09 120/80   bp is     Today No cp or palpitations or headaches or edema  No side effects to medicines     Chemistry      Component Value Date/Time   NA 144 05/31/2011 0827   K 3.5 05/31/2011 0827   CL 108 05/31/2011 0827   CO2 27 05/31/2011 0827   BUN 23 05/31/2011 0827   CREATININE 0.6 05/31/2011 0827      Component Value Date/Time   CALCIUM 8.9 05/31/2011 0827   ALKPHOS 50 05/31/2011 0827   AST 20 05/31/2011 0827   ALT 21 05/31/2011 0827   BILITOT 0.4 05/31/2011 0827       Gyn status- hysterectomy No bleeding or other gyn symptoms  No abn paps or cervcal pathology   mammo 9/10- pt is unsure if that is her last one  Self exam = no lumps or changes   Colon cancer screen  Flu shot- got it in the fall  Zoster status - is interested in it   Hyperlipidemia  Lab Results  Component Value Date   CHOL 168 05/31/2011   CHOL 152 01/04/2010   CHOL 143 07/09/2009   Lab Results  Component Value Date   HDL 40.90 05/31/2011   HDL 40.98 01/04/2010   HDL 41.30 07/09/2009   Lab Results  Component Value Date   LDLCALC 93 05/31/2011   LDLCALC 90 01/04/2010   LDLCALC 85 07/09/2009   Lab Results  Component Value Date   TRIG 173.0* 05/31/2011   TRIG 48.0 01/04/2010   TRIG 82.0 07/09/2009   Lab Results  Component Value Date   CHOLHDL 4 05/31/2011   CHOLHDL 3 01/04/2010   CHOLHDL 3 07/09/2009   Lab Results  Component Value Date   LDLDIRECT 160.7 01/14/2007   LDLDIRECT 202.7 08/01/2006   overall not as good as it was  Knows she can eat a better diet   Patient Active Problem List  Diagnoses  . CLOSTRIDIUM DIFFICILE COLITIS  . HYPOTHYROIDISM  . HYPERLIPIDEMIA  . OBESITY  . ANXIETY  . DEPRESSION  . HYPERTENSION  . CEREBRAL ANEURYSM  . IBS  . BACK PAIN, LUMBAR, WITH RADICULOPATHY  . SLEEP APNEA  . SWEATING  . OVEREATING  . HYPERGLYCEMIA  . SKIN CANCER, HX OF  . TOBACCO USE, QUIT  . POSTMENOPAUSAL STATUS  . Routine general medical examination at a health care facility  . Other screening mammogram  No past medical history on file. No past surgical history on file. History  Substance Use Topics  . Smoking status: Former Games developer  . Smokeless tobacco: Former Neurosurgeon    Quit date: 06/07/2006  . Alcohol Use: Not on file   No family history on file. Allergies  Allergen Reactions  . Codeine     REACTION: nausea and vomiting   Current Outpatient Prescriptions on File Prior to Visit  Medication Sig Dispense Refill  . hydrochlorothiazide (HYDRODIURIL) 25 MG tablet Take 1 tablet (25 mg total) by mouth daily.  90 tablet  3  . pravastatin (PRAVACHOL) 20 MG tablet Take 1 tablet (20 mg total) by mouth daily.  90 tablet  3        Review of Systems Review of Systems  Constitutional: Negative for fever, appetite change, fatigue and unexpected weight change.  Eyes: Negative for pain and visual disturbance.  Respiratory: Negative for cough and shortness of breath.   Cardiovascular: Negative for cp or palpitations    Gastrointestinal: Negative for nausea, diarrhea and constipation.    Genitourinary: Negative for urgency and frequency.  Skin: Negative for pallor or rash   Neurological: Negative for weakness, light-headedness, numbness and headaches.  Hematological: Negative for adenopathy. Does not bruise/bleed easily.  Psychiatric/Behavioral: Negative for dysphoric mood. The patient is not nervous/anxious.  pos for emotional eating         Objective:   Physical Exam  Constitutional: She appears well-developed and well-nourished. No distress.       Obese and well appearing   HENT:  Head: Normocephalic and atraumatic.  Right Ear: External ear normal.  Left Ear: External ear normal.  Nose: Nose normal.  Mouth/Throat: Oropharynx is clear and moist. No oropharyngeal exudate.  Eyes: Conjunctivae and EOM are normal. Right eye exhibits no discharge. Left eye exhibits no discharge. No scleral icterus.  Neck: Normal range of motion. Neck supple. No JVD present. Carotid bruit is not present. No thyromegaly present.  Cardiovascular: Normal rate, regular rhythm, normal heart sounds and intact distal pulses.  Exam reveals no gallop.   Pulmonary/Chest: Effort normal and breath sounds normal. No respiratory distress. She has no wheezes. She exhibits no tenderness.  Abdominal: Soft. Bowel sounds are normal. She exhibits no distension, no abdominal bruit and no mass. There is no tenderness.  Genitourinary: No breast swelling, tenderness, discharge or bleeding.       Breast exam: No mass, nodules, thickening, tenderness, bulging, retraction, inflamation, nipple discharge or skin changes noted.  No axillary or clavicular LA.  Chaperoned exam.    Musculoskeletal: Normal range of motion. She exhibits no edema and no tenderness.  Lymphadenopathy:    She has no cervical adenopathy.  Neurological: She is alert. She has normal reflexes. No cranial nerve deficit. She exhibits normal muscle tone. Coordination normal.  Skin: Skin is warm and dry. No rash noted. No erythema. No pallor.   Psychiatric: She has a normal mood and affect.          Assessment & Plan:

## 2011-06-07 NOTE — Assessment & Plan Note (Signed)
a1c up with 19 lb wt gain- pt is motivated to get wt off Enc to continue with wt watchers and exercise Rev low glycemic diet

## 2011-06-12 ENCOUNTER — Ambulatory Visit: Payer: Self-pay | Admitting: Family Medicine

## 2011-06-16 ENCOUNTER — Encounter: Payer: Self-pay | Admitting: Family Medicine

## 2011-06-19 ENCOUNTER — Encounter: Payer: Self-pay | Admitting: Family Medicine

## 2011-06-19 ENCOUNTER — Encounter: Payer: Self-pay | Admitting: *Deleted

## 2011-08-05 ENCOUNTER — Emergency Department: Payer: Self-pay | Admitting: Internal Medicine

## 2011-09-04 ENCOUNTER — Other Ambulatory Visit: Payer: Self-pay

## 2011-09-08 ENCOUNTER — Ambulatory Visit: Payer: Self-pay | Admitting: Family Medicine

## 2012-03-15 ENCOUNTER — Other Ambulatory Visit: Payer: Self-pay | Admitting: Family Medicine

## 2012-03-15 NOTE — Telephone Encounter (Signed)
Please schedule a f/u appt and then refil until then, thanks

## 2012-03-15 NOTE — Telephone Encounter (Signed)
Received fax refill request, no recent appt and no future appt, ok to refill? 

## 2012-03-19 MED ORDER — LEVOTHYROXINE SODIUM 150 MCG PO TABS: 150.0000 ug | ORAL_TABLET | Freq: Every day | ORAL | Status: DC

## 2012-03-19 NOTE — Telephone Encounter (Signed)
Left voicemail on home and cell # requesting pt to call office

## 2012-03-19 NOTE — Telephone Encounter (Signed)
Pt advised, and appt scheduled for 04/08/12. Med refilled

## 2012-04-04 ENCOUNTER — Other Ambulatory Visit: Payer: Self-pay | Admitting: Dermatology

## 2012-04-08 ENCOUNTER — Ambulatory Visit: Payer: BC Managed Care – PPO | Admitting: Family Medicine

## 2012-04-15 ENCOUNTER — Encounter: Payer: Self-pay | Admitting: Family Medicine

## 2012-04-15 ENCOUNTER — Ambulatory Visit (INDEPENDENT_AMBULATORY_CARE_PROVIDER_SITE_OTHER): Payer: BC Managed Care – PPO | Admitting: Family Medicine

## 2012-04-15 VITALS — BP 124/72 | HR 68 | Temp 97.6°F | Ht 61.75 in | Wt 203.2 lb

## 2012-04-15 DIAGNOSIS — R7309 Other abnormal glucose: Secondary | ICD-10-CM

## 2012-04-15 DIAGNOSIS — E669 Obesity, unspecified: Secondary | ICD-10-CM

## 2012-04-15 DIAGNOSIS — I10 Essential (primary) hypertension: Secondary | ICD-10-CM

## 2012-04-15 DIAGNOSIS — E039 Hypothyroidism, unspecified: Secondary | ICD-10-CM

## 2012-04-15 DIAGNOSIS — E785 Hyperlipidemia, unspecified: Secondary | ICD-10-CM

## 2012-04-15 MED ORDER — HYDROCHLOROTHIAZIDE 25 MG PO TABS: 25.0000 mg | ORAL_TABLET | Freq: Every day | ORAL | Status: DC

## 2012-04-15 MED ORDER — PRAVASTATIN SODIUM 20 MG PO TABS: 20.0000 mg | ORAL_TABLET | Freq: Every day | ORAL | Status: DC

## 2012-04-15 MED ORDER — PROPRANOLOL HCL ER 80 MG PO CP24: 80.0000 mg | ORAL_CAPSULE | Freq: Every day | ORAL | Status: DC

## 2012-04-15 MED ORDER — LEVOTHYROXINE SODIUM 150 MCG PO TABS: 150.0000 ug | ORAL_TABLET | Freq: Every day | ORAL | Status: DC

## 2012-04-15 NOTE — Patient Instructions (Addendum)
I'm glad you are feeling good ! Keep up the good work with diet and exercise and weight loss Schedule fasting labs in May  Your mammogram will be due in May

## 2012-04-15 NOTE — Assessment & Plan Note (Signed)
Due for A!C- happy she is eating better/ exercising and loosing weight Labs scheduled for May  Enc to keep up the good work

## 2012-04-15 NOTE — Progress Notes (Signed)
Subjective:    Patient ID: Andrea Cooper, female    DOB: 06-14-1950, 62 y.o.   MRN: [id number]  HPI Here for f/u of chronic health problems   Doing very well overall - happy and thinks her medicines are very balanced   Wt is down 7 lb with bmi of 37  (joined weight watchers) - and now she has gone back to healthy eating  Obese- motivated to work on that  Is getting some exercise - walking with her exercise (no matter what the weather   Wants to wait on labs until May  bp is stable today  No cp or palpitations or headaches or edema  No side effects to medicines  BP Readings from Last 3 Encounters:  04/15/12 124/72  06/07/11 112/70  01/12/10 112/72      Hypothyroidism  Pt has no clinical changes- feels like her thyroid is stable overall  Mood is also excellent  No change in energy level/ hair or skin/ edema and no tremor Lab Results  Component Value Date   TSH 1.20 05/31/2011     Hyperglycemia Lab Results  Component Value Date   HGBA1C 6.1 05/31/2011   diet is much better now   Waiting on results from MRI at [facility] - watching her aneurysm  Hyperlipidemia - due for check on pravastatin   Patient Active Problem List  Diagnosis  . CLOSTRIDIUM DIFFICILE COLITIS  . HYPOTHYROIDISM  . HYPERLIPIDEMIA  . OBESITY  . ANXIETY  . DEPRESSION  . HYPERTENSION  . CEREBRAL ANEURYSM  . IBS  . BACK PAIN, LUMBAR, WITH RADICULOPATHY  . SLEEP APNEA  . SWEATING  . OVEREATING  . HYPERGLYCEMIA  . SKIN CANCER, HX OF  . TOBACCO USE, QUIT  . POSTMENOPAUSAL STATUS  . Routine general medical examination at a health care facility  . Other screening mammogram   No past medical history on file. No past surgical history on file. History  Substance Use Topics  . Smoking status: Former Games developer  . Smokeless tobacco: Former Neurosurgeon    Quit date: 06/07/2006  . Alcohol Use: Yes     Comment: occ   No family history on file. Allergies  Allergen Reactions  . Codeine     REACTION: nausea and  vomiting   Current Outpatient Prescriptions on File Prior to Visit  Medication Sig Dispense Refill  . hydrochlorothiazide (HYDRODIURIL) 25 MG tablet Take 1 tablet (25 mg total) by mouth daily.  90 tablet  3  . levothyroxine (SYNTHROID, LEVOTHROID) 150 MCG tablet Take 1 tablet (150 mcg total) by mouth daily.  90 tablet  0  . pravastatin (PRAVACHOL) 20 MG tablet Take 1 tablet (20 mg total) by mouth daily.  90 tablet  3  . propranolol ER (INDERAL LA) 80 MG 24 hr capsule Take 1 capsule (80 mg total) by mouth daily.  90 capsule  3   No current facility-administered medications on file prior to visit.     Review of Systems Review of Systems  Constitutional: Negative for fever, appetite change, fatigue and unexpected weight change.  Eyes: Negative for pain and visual disturbance.  Respiratory: Negative for cough and shortness of breath.   Cardiovascular: Negative for cp or palpitations    Gastrointestinal: Negative for nausea, diarrhea and constipation.  Genitourinary: Negative for urgency and frequency.  Skin: Negative for pallor or rash   Neurological: Negative for weakness, light-headedness, numbness and headaches.  Hematological: Negative for adenopathy. Does not bruise/bleed easily.  Psychiatric/Behavioral: Negative for dysphoric mood.  The patient is not nervous/anxious.         Objective:   Physical Exam  Constitutional: She appears well-developed and well-nourished. No distress.  HENT:  Head: Normocephalic and atraumatic.  Mouth/Throat: Oropharynx is clear and moist.  Eyes: Conjunctivae and EOM are normal. Pupils are equal, round, and reactive to light. No scleral icterus.  Neck: Normal range of motion. Neck supple. No JVD present. Carotid bruit is not present. No thyromegaly present.  Cardiovascular: Normal rate, regular rhythm and normal heart sounds.  Exam reveals no gallop.   Pulmonary/Chest: Effort normal and breath sounds normal. No respiratory distress. She has no wheezes.   Abdominal: Soft. Bowel sounds are normal. She exhibits no distension, no abdominal bruit and no mass. There is no tenderness.  Musculoskeletal: Normal range of motion. She exhibits no edema and no tenderness.  Lymphadenopathy:    She has no cervical adenopathy.  Neurological: She is alert. She has normal reflexes. No cranial nerve deficit. She exhibits normal muscle tone. Coordination normal.  Skin: Skin is warm and dry. No rash noted. No erythema. No pallor.  Psychiatric: She has a normal mood and affect.  Quite cheerful- mood is excellent- positive today          Assessment & Plan:

## 2012-04-15 NOTE — Assessment & Plan Note (Signed)
On pravastatin and diet  Rev low sat fat diet- doing well with that  Fasting labs will be in May

## 2012-04-15 NOTE — Assessment & Plan Note (Signed)
Commended on the great work with diet and exercise so far and enc her to continue

## 2012-04-15 NOTE — Assessment & Plan Note (Signed)
bp in fair control at this time  No changes needed  Disc lifstyle change with low sodium diet and exercise   Pt is doing well and really working on lifestyle habits  Labs due- pt will get them in May

## 2012-05-27 ENCOUNTER — Other Ambulatory Visit: Payer: BC Managed Care – PPO

## 2012-10-24 ENCOUNTER — Other Ambulatory Visit: Payer: Self-pay | Admitting: Dermatology

## 2013-01-16 HISTORY — PX: TOE SURGERY: SHX1073

## 2013-01-24 ENCOUNTER — Telehealth: Payer: Self-pay | Admitting: Family Medicine

## 2013-01-24 NOTE — Telephone Encounter (Signed)
Cannot px abx without exam and if she has drainage-more likely a viral uri (cold ) than strep  sympt care-antihistamine/ tylenol/ salt water gargles - f/u if no improving  Can schedule with sat clinic if she feels very sick

## 2013-01-24 NOTE — Telephone Encounter (Signed)
Pt notified of Dr. Marliss Coots comments/recommendations pt said she is starting to feel better so declined sat clinic appt

## 2013-01-24 NOTE — Telephone Encounter (Signed)
Patient Information:  Caller Name: [place]  Phone: [phone]  Patient: Andrea Cooper  Gender: Female  DOB: 1950/07/27  Age: 63 Years  PCP: Loura Pardon [facility])  Office Follow Up:  Does the office need to follow up with this patient?: Yes  Instructions For The Office: Please call and advise   Symptoms  Reason For Call & Symptoms: Pt is calling to say that she has a sore throat.  She is asking for an abx to be called in. Pt has sinus drainage. Onset on Wed 01/23/13.  Reviewed Health History In EMR: Yes  Reviewed Medications In EMR: Yes  Reviewed Allergies In EMR: Yes  Reviewed Surgeries / Procedures: Yes  Date of Onset of Symptoms: 01/22/2013  Treatments Tried: netti pot  Treatments Tried Worked: No  Guideline(s) Used:  Sore Throat  Disposition Per Guideline:   Home Care  Reason For Disposition Reached:   Sore throat  Advice Given:  For Relief of Sore Throat Pain:  Sip warm chicken broth or apple juice.  Suck on hard candy or a throat lozenge (over-the-counter).  Gargle warm salt water 3 times daily (1 teaspoon of salt in 8 oz or 240 ml of warm water).  Soft Diet:   Cold drinks and milk shakes are especially good (Reason: swollen tonsils can make some foods hard to swallow).  Liquids:  Adequate liquid intake is important to prevent dehydration. Drink 6-8 glasses of water per day.  Liquids:  Adequate liquid intake is important to prevent dehydration. Drink 6-8 glasses of water per day.  Contagiousness:   You can return to work or school after the fever is gone and you feel well enough to participate in normal activities. If your doctor determines that you have Strep throat, then you will need to take an antibiotic for 24 hours before you can return.  Expected Course:  Sore throats with viral illnesses usually last 3 or 4 days.  Call Back If:  Sore throat is the main symptom and it lasts longer than 24 hours  Expected Course:  Sore throats with viral illnesses  usually last 3 or 4 days.  Patient Refused Recommendation:  Patient Requests Prescription  Pt will not come to the office today or tomorrow. Asking for an abx prescription to be called into [facility].

## 2013-02-27 ENCOUNTER — Telehealth: Payer: Self-pay | Admitting: *Deleted

## 2013-02-27 NOTE — Telephone Encounter (Signed)
Dr. Glori Bickers received lab results pt had done at [facility], Dr. Glori Bickers advise me that pt needs a f/u appt to discuss lab results.  Called pt and no answer so left voicemail requesting pt to call office back

## 2013-02-27 NOTE — Telephone Encounter (Signed)
Pt returned my call she said that she just had surgery and isn't able to get out of the house yet but once she is recovered she will call back and schedule a f/u appt but it may be a few weeks

## 2013-05-01 ENCOUNTER — Other Ambulatory Visit: Payer: Self-pay | Admitting: Dermatology

## 2013-05-12 ENCOUNTER — Encounter: Payer: Self-pay | Admitting: Family Medicine

## 2013-05-12 ENCOUNTER — Ambulatory Visit (INDEPENDENT_AMBULATORY_CARE_PROVIDER_SITE_OTHER): Payer: BC Managed Care – PPO | Admitting: Family Medicine

## 2013-05-12 VITALS — BP 126/82 | HR 60 | Temp 98.3°F | Ht 61.75 in | Wt 202.5 lb

## 2013-05-12 DIAGNOSIS — E785 Hyperlipidemia, unspecified: Secondary | ICD-10-CM

## 2013-05-12 DIAGNOSIS — I10 Essential (primary) hypertension: Secondary | ICD-10-CM

## 2013-05-12 DIAGNOSIS — R7309 Other abnormal glucose: Secondary | ICD-10-CM

## 2013-05-12 DIAGNOSIS — E669 Obesity, unspecified: Secondary | ICD-10-CM

## 2013-05-12 DIAGNOSIS — E039 Hypothyroidism, unspecified: Secondary | ICD-10-CM

## 2013-05-12 LAB — COMPREHENSIVE METABOLIC PANEL
ALT: 26 U/L (ref 0–35)
AST: 22 U/L (ref 0–37)
Albumin: 4.4 g/dL (ref 3.5–5.2)
Alkaline Phosphatase: 41 U/L (ref 39–117)
BUN: 19 mg/dL (ref 6–23)
CALCIUM: 9.9 mg/dL (ref 8.4–10.5)
CHLORIDE: 99 meq/L (ref 96–112)
CO2: 29 meq/L (ref 19–32)
CREATININE: 0.8 mg/dL (ref 0.4–1.2)
GFR: 73.47 mL/min (ref >60.00)
Glucose, Bld: 128 mg/dL — ABNORMAL HIGH (ref 70–99)
Potassium: 4.1 mEq/L (ref 3.5–5.1)
Sodium: 139 mEq/L (ref 135–145)
Total Bilirubin: 0.4 mg/dL (ref 0.3–1.2)
Total Protein: 7.3 g/dL (ref 6.0–8.3)

## 2013-05-12 LAB — LIPID PANEL
CHOLESTEROL: 165 mg/dL (ref 0–200)
HDL: 41.5 mg/dL (ref >39.00)
LDL Cholesterol: 103 mg/dL — ABNORMAL HIGH (ref 0–99)
TRIGLYCERIDES: 103 mg/dL (ref 0.0–149.0)
Total CHOL/HDL Ratio: 4
VLDL: 20.6 mg/dL (ref 0.0–40.0)

## 2013-05-12 LAB — HEMOGLOBIN A1C: Hgb A1c MFr Bld: 5.6 % (ref 4.6–6.5)

## 2013-05-12 LAB — TSH: TSH: 6.6 u[IU]/mL — AB (ref 0.35–5.50)

## 2013-05-12 NOTE — Progress Notes (Signed)
Subjective:    Patient ID: Andrea Cooper, female    DOB: 1950/03/31, 63 y.o.   MRN: [id number]  HPI Here for f/u of chronic medical problems   Has been doing fairly well overall  Had surgery on her foot for bone spurs - had a cast and then a boot  Lost 13 lb since she got the cast off She will be able to start walking   Had a generally bad year - mood problems- saw Dr Lajoyce Corners  She is on prozac and wellbutrin- doing better   Hypothyroid Due for labs   Hyperlipidemia On pravachol and diet   Hyperglycemia Lab Results  Component Value Date   HGBA1C 6.1 05/31/2011    Due for labs   Her diet is now very very good - protein and veg and fruit and small amt of carbs   bp is stable today  No cp or palpitations or headaches or edema  No side effects to medicines  BP Readings from Last 3 Encounters:  05/12/13 126/82  04/15/12 124/72  06/07/11 112/70     Patient Active Problem List   Diagnosis Date Noted  . Other screening mammogram 06/07/2011  . Routine general medical examination at a health care facility 05/31/2011  . SLEEP APNEA 09/13/2009  . OBESITY 01/13/2009  . POSTMENOPAUSAL STATUS 10/01/2008  . BACK PAIN, LUMBAR, WITH RADICULOPATHY 08/10/2008  . SWEATING 07/15/2008  . HYPERGLYCEMIA 05/22/2007  . TOBACCO USE, QUIT 05/22/2007  . CEREBRAL ANEURYSM 08/09/2006  . HYPOTHYROIDISM 07/25/2006  . HYPERLIPIDEMIA 07/25/2006  . ANXIETY 07/25/2006  . DEPRESSION 07/25/2006  . HYPERTENSION 07/25/2006  . IBS 07/25/2006  . OVEREATING 07/25/2006  . SKIN CANCER, HX OF 07/25/2006   Past Medical History  Diagnosis Date  . History of anxiety   . History of depression   . History of hyperlipidemia   . History of hypothyroidism   . History of skin cancer     hx of melanoma left lower arm, recurrance  . History of cerebral aneurysm repair     history of cerebral aneurysm   . History of hyperglycemia    Past Surgical History  Procedure Laterality Date  . Appendectomy    .  Total abdominal hysterectomy w/ bilateral salpingoophorectomy    . Tonsillectomy    . Cardiolite  12/1997    normal EF 68%  . Eye surgery      RR  . Foot surgery  02/1998    bilateral  . Cerebral aneurysm repair     History  Substance Use Topics  . Smoking status: Former Research scientist (life sciences)  . Smokeless tobacco: Former Systems developer    Quit date: 06/07/2006  . Alcohol Use: Yes     Comment: occ   Family History  Problem Relation Age of Onset  . Diabetes Father   . Diabetes Mother   . Depression Father   . CAD Father   . Diverticulosis Mother   . Adrenal disorder Mother   . Breast cancer Other     distant relatives (all on mother's side)  . Cerebral aneurysm Sister    Allergies  Allergen Reactions  . Codeine     REACTION: nausea and vomiting   Current Outpatient Prescriptions on File Prior to Visit  Medication Sig Dispense Refill  . buPROPion (WELLBUTRIN XL) 300 MG 24 hr tablet Take 300 mg by mouth daily.      . clobetasol (TEMOVATE) 0.05 % external solution as needed.      Marland Kitchen FLUoxetine (PROZAC) 20  MG capsule Take 60 mg by mouth daily.       Marland Kitchen levothyroxine (SYNTHROID, LEVOTHROID) 150 MCG tablet Take 1 tablet (150 mcg total) by mouth daily.  90 tablet  3  . pravastatin (PRAVACHOL) 20 MG tablet Take 1 tablet (20 mg total) by mouth daily.  90 tablet  3  . propranolol ER (INDERAL LA) 80 MG 24 hr capsule Take 1 capsule (80 mg total) by mouth daily.  90 capsule  3  . hydrochlorothiazide (HYDRODIURIL) 25 MG tablet Take 1 tablet (25 mg total) by mouth daily.  90 tablet  3   No current facility-administered medications on file prior to visit.      Review of Systems Review of Systems  Constitutional: Negative for fever, appetite change,  and unexpected weight change.  Eyes: Negative for pain and visual disturbance.  Respiratory: Negative for cough and shortness of breath.   Cardiovascular: Negative for cp or palpitations    Gastrointestinal: Negative for nausea, diarrhea and constipation.    Genitourinary: Negative for urgency and frequency.  Skin: Negative for pallor or rash   Neurological: Negative for weakness, light-headedness, numbness and headaches.  Hematological: Negative for adenopathy. Does not bruise/bleed easily.  Psychiatric/Behavioral: pos for anxiety and depression that are improving         Objective:   Physical Exam  Constitutional: She appears well-developed and well-nourished. No distress.  obese and well appearing   HENT:  Head: Normocephalic and atraumatic.  Eyes: Conjunctivae and EOM are normal. Pupils are equal, round, and reactive to light. No scleral icterus.  Neck: Normal range of motion. Neck supple. No JVD present. Carotid bruit is not present. No thyromegaly present.  Cardiovascular: Normal rate, regular rhythm, normal heart sounds and intact distal pulses.  Exam reveals no gallop.   Pulmonary/Chest: Effort normal and breath sounds normal. No respiratory distress. She has no wheezes. She has no rales.  Abdominal: Soft. Bowel sounds are normal. She exhibits no abdominal bruit.  Musculoskeletal: She exhibits no edema.  Lymphadenopathy:    She has no cervical adenopathy.  Neurological: She is alert. She has normal reflexes. No cranial nerve deficit. She exhibits normal muscle tone. Coordination normal.  Skin: Skin is warm and dry. No rash noted. No erythema. No pallor.  Psychiatric: She has a normal mood and affect.          Assessment & Plan:

## 2013-05-12 NOTE — Assessment & Plan Note (Signed)
bp in fair control at this time  BP Readings from Last 1 Encounters:  05/12/13 126/82   No changes needed Disc lifstyle change with low sodium diet and exercise  Labs today

## 2013-05-12 NOTE — Assessment & Plan Note (Signed)
tsh today and update  Adj levothyroxine dose if necessary

## 2013-05-12 NOTE — Assessment & Plan Note (Signed)
Discussed how this problem influences overall health and the risks it imposes  Reviewed plan for weight loss with lower calorie diet (via better food choices and also portion control or program like weight watchers) and exercise building up to or more than 30 minutes 5 days per week including some aerobic activity    

## 2013-05-12 NOTE — Assessment & Plan Note (Signed)
Lab today Pravastatin and diet Rev low sat fat diet

## 2013-05-12 NOTE — Progress Notes (Signed)
Pre visit review using our clinic review tool, if applicable. No additional management support is needed unless otherwise documented below in the visit note. 

## 2013-05-12 NOTE — Assessment & Plan Note (Signed)
A1C today Pt has worked on diet and exercise emph imp of wt loss to prevent DM

## 2013-05-12 NOTE — Patient Instructions (Signed)
Keep working on [employer] and start exercise when you can  Labs today  Schedule PE with labs prior when able

## 2013-05-13 ENCOUNTER — Telehealth: Payer: Self-pay | Admitting: Family Medicine

## 2013-05-13 NOTE — Telephone Encounter (Signed)
Relevant patient education assigned to patient using Emmi. ° °

## 2013-05-15 ENCOUNTER — Telehealth: Payer: Self-pay | Admitting: Family Medicine

## 2013-05-15 MED ORDER — LEVOTHYROXINE SODIUM 175 MCG PO TABS: 175.0000 ug | ORAL_TABLET | Freq: Every day | ORAL | Status: DC

## 2013-05-15 NOTE — Telephone Encounter (Signed)
Pt advise Rx sent to pharmacy.  Pt declined to schedule her 6 week lab appt to recheck TSH she said she will call back later to schedule it

## 2013-05-15 NOTE — Telephone Encounter (Signed)
Message copied by Abner Greenspan on Thu May 15, 2013 11:27 AM ------      Message from: Tammi Sou      Created: Thu May 15, 2013  8:42 AM       Pt advise of her labs, and Dr. Marliss Coots comments.             Pt advise me that she hasn't missed doses of her thyroid medication. I advise pt that we will need to inc her dose. Pt uses [facility] on [address] in [place] ------

## 2013-05-15 NOTE — Telephone Encounter (Signed)
I will send that  Please re check tsh in about 6 weeks for hypothyroidism

## 2013-06-18 ENCOUNTER — Telehealth: Payer: Self-pay | Admitting: Family Medicine

## 2013-06-18 ENCOUNTER — Other Ambulatory Visit (INDEPENDENT_AMBULATORY_CARE_PROVIDER_SITE_OTHER): Payer: BC Managed Care – PPO

## 2013-06-18 DIAGNOSIS — E039 Hypothyroidism, unspecified: Secondary | ICD-10-CM

## 2013-06-18 DIAGNOSIS — Z0000 Encounter for general adult medical examination without abnormal findings: Secondary | ICD-10-CM

## 2013-06-18 DIAGNOSIS — R7309 Other abnormal glucose: Secondary | ICD-10-CM

## 2013-06-18 DIAGNOSIS — I10 Essential (primary) hypertension: Secondary | ICD-10-CM

## 2013-06-18 DIAGNOSIS — E785 Hyperlipidemia, unspecified: Secondary | ICD-10-CM

## 2013-06-18 LAB — LIPID PANEL
CHOLESTEROL: 148 mg/dL (ref 0–200)
HDL: 41.4 mg/dL (ref >39.00)
LDL Cholesterol: 85 mg/dL (ref 0–99)
NonHDL: 106.6
Total CHOL/HDL Ratio: 4
Triglycerides: 109 mg/dL (ref 0.0–149.0)
VLDL: 21.8 mg/dL (ref 0.0–40.0)

## 2013-06-18 LAB — COMPREHENSIVE METABOLIC PANEL
ALT: 20 U/L (ref 0–35)
AST: 20 U/L (ref 0–37)
Albumin: 3.7 g/dL (ref 3.5–5.2)
Alkaline Phosphatase: 43 U/L (ref 39–117)
BILIRUBIN TOTAL: 0.5 mg/dL (ref 0.2–1.2)
BUN: 17 mg/dL (ref 6–23)
CO2: 29 meq/L (ref 19–32)
Calcium: 8.9 mg/dL (ref 8.4–10.5)
Chloride: 106 mEq/L (ref 96–112)
Creatinine, Ser: 0.7 mg/dL (ref 0.4–1.2)
GFR: 92.44 mL/min (ref >60.00)
GLUCOSE: 147 mg/dL — AB (ref 70–99)
Potassium: 3.6 mEq/L (ref 3.5–5.1)
Sodium: 142 mEq/L (ref 135–145)
Total Protein: 6.8 g/dL (ref 6.0–8.3)

## 2013-06-18 LAB — CBC WITH DIFFERENTIAL/PLATELET
Basophils Absolute: 0 10*3/uL (ref 0.0–0.1)
Basophils Relative: 0.6 % (ref 0.0–3.0)
EOS PCT: 1.3 % (ref 0.0–5.0)
Eosinophils Absolute: 0.1 10*3/uL (ref 0.0–0.7)
HCT: 38.2 % (ref 36.0–46.0)
HEMOGLOBIN: 12.8 g/dL (ref 12.0–15.0)
Lymphocytes Relative: 33.2 % (ref 12.0–46.0)
Lymphs Abs: 1.8 10*3/uL (ref 0.7–4.0)
MCHC: 33.5 g/dL (ref 30.0–36.0)
MCV: 89.2 fl (ref 78.0–100.0)
MONO ABS: 0.5 10*3/uL (ref 0.1–1.0)
MONOS PCT: 9.5 % (ref 3.0–12.0)
NEUTROS ABS: 3 10*3/uL (ref 1.4–7.7)
Neutrophils Relative %: 55.4 % (ref 43.0–77.0)
Platelets: 249 10*3/uL (ref 150.0–400.0)
RBC: 4.28 Mil/uL (ref 3.87–5.11)
RDW: 13.8 % (ref 11.5–15.5)
WBC: 5.3 10*3/uL (ref 4.0–10.5)

## 2013-06-18 LAB — HEMOGLOBIN A1C: Hgb A1c MFr Bld: 5.9 % (ref 4.6–6.5)

## 2013-06-18 LAB — TSH: TSH: 0.48 u[IU]/mL (ref 0.35–4.50)

## 2013-06-18 NOTE — Telephone Encounter (Signed)
Message copied by Abner Greenspan on Wed Jun 18, 2013  6:16 AM ------      Message from: Marchia Bond      Created: Fri Jun 13, 2013 10:27 AM      Regarding: Cpx labs 06/18/13       Please order  future cpx labs for pt's upcoming lab appt.      Thanks      Tasha       ------

## 2013-06-26 ENCOUNTER — Ambulatory Visit: Payer: Self-pay | Admitting: Family Medicine

## 2013-06-26 ENCOUNTER — Encounter: Payer: Self-pay | Admitting: Family Medicine

## 2013-07-31 ENCOUNTER — Other Ambulatory Visit: Payer: Self-pay | Admitting: *Deleted

## 2013-07-31 MED ORDER — PROPRANOLOL HCL ER 80 MG PO CP24: 80.0000 mg | ORAL_CAPSULE | Freq: Every day | ORAL | Status: DC

## 2013-07-31 MED ORDER — HYDROCHLOROTHIAZIDE 25 MG PO TABS: 25.0000 mg | ORAL_TABLET | Freq: Every day | ORAL | Status: DC

## 2013-08-22 ENCOUNTER — Ambulatory Visit: Payer: Self-pay | Admitting: Gastroenterology

## 2013-09-08 ENCOUNTER — Encounter: Payer: Self-pay | Admitting: Family Medicine

## 2013-09-29 ENCOUNTER — Telehealth: Payer: Self-pay | Admitting: Family Medicine

## 2013-09-29 DIAGNOSIS — Z0000 Encounter for general adult medical examination without abnormal findings: Secondary | ICD-10-CM

## 2013-09-29 DIAGNOSIS — R7309 Other abnormal glucose: Secondary | ICD-10-CM

## 2013-09-29 NOTE — Telephone Encounter (Signed)
Message copied by Abner Greenspan on Mon Sep 29, 2013  9:39 PM ------      Message from: Ellamae Sia      Created: Tue Sep 23, 2013 11:52 AM      Regarding: Lab orders for Tuesday, 9.15.15       Patient is scheduled for CPX labs, please order future labs, Thanks , Terri       ------

## 2013-09-30 ENCOUNTER — Other Ambulatory Visit (INDEPENDENT_AMBULATORY_CARE_PROVIDER_SITE_OTHER): Payer: BC Managed Care – PPO

## 2013-09-30 DIAGNOSIS — R7309 Other abnormal glucose: Secondary | ICD-10-CM

## 2013-09-30 DIAGNOSIS — Z0000 Encounter for general adult medical examination without abnormal findings: Secondary | ICD-10-CM

## 2013-09-30 LAB — COMPREHENSIVE METABOLIC PANEL
ALT: 28 U/L (ref 0–35)
AST: 26 U/L (ref 0–37)
Albumin: 3.9 g/dL (ref 3.5–5.2)
Alkaline Phosphatase: 45 U/L (ref 39–117)
BILIRUBIN TOTAL: 0.7 mg/dL (ref 0.2–1.2)
BUN: 12 mg/dL (ref 6–23)
CO2: 29 mEq/L (ref 19–32)
CREATININE: 0.7 mg/dL (ref 0.4–1.2)
Calcium: 9.4 mg/dL (ref 8.4–10.5)
Chloride: 103 mEq/L (ref 96–112)
GFR: 90.82 mL/min (ref >60.00)
Glucose, Bld: 156 mg/dL — ABNORMAL HIGH (ref 70–99)
Potassium: 3.8 mEq/L (ref 3.5–5.1)
Sodium: 143 mEq/L (ref 135–145)
Total Protein: 7.2 g/dL (ref 6.0–8.3)

## 2013-09-30 LAB — LIPID PANEL
CHOLESTEROL: 191 mg/dL (ref 0–200)
HDL: 43.2 mg/dL (ref >39.00)
LDL Cholesterol: 124 mg/dL — ABNORMAL HIGH (ref 0–99)
NonHDL: 147.8
Total CHOL/HDL Ratio: 4
Triglycerides: 121 mg/dL (ref 0.0–149.0)
VLDL: 24.2 mg/dL (ref 0.0–40.0)

## 2013-09-30 LAB — TSH: TSH: 1.43 u[IU]/mL (ref 0.35–4.50)

## 2013-09-30 LAB — HEMOGLOBIN A1C: HEMOGLOBIN A1C: 6.7 % — AB (ref 4.6–6.5)

## 2013-10-01 LAB — CBC WITH DIFFERENTIAL/PLATELET
BASOS PCT: 0.4 % (ref 0.0–3.0)
Basophils Absolute: 0 10*3/uL (ref 0.0–0.1)
Eosinophils Absolute: 0.1 10*3/uL (ref 0.0–0.7)
Eosinophils Relative: 1.6 % (ref 0.0–5.0)
HEMATOCRIT: 41 % (ref 36.0–46.0)
Hemoglobin: 13.7 g/dL (ref 12.0–15.0)
LYMPHS ABS: 2.4 10*3/uL (ref 0.7–4.0)
Lymphocytes Relative: 37.8 % (ref 12.0–46.0)
MCHC: 33.4 g/dL (ref 30.0–36.0)
MCV: 89 fl (ref 78.0–100.0)
MONO ABS: 0.3 10*3/uL (ref 0.1–1.0)
Monocytes Relative: 5.2 % (ref 3.0–12.0)
Neutro Abs: 3.5 10*3/uL (ref 1.4–7.7)
Neutrophils Relative %: 55 % (ref 43.0–77.0)
PLATELETS: 249 10*3/uL (ref 150.0–400.0)
RBC: 4.6 Mil/uL (ref 3.87–5.11)
RDW: 14.7 % (ref 11.5–15.5)
WBC: 6.4 10*3/uL (ref 4.0–10.5)

## 2013-10-02 ENCOUNTER — Other Ambulatory Visit: Payer: Self-pay | Admitting: Family Medicine

## 2013-10-07 ENCOUNTER — Ambulatory Visit (INDEPENDENT_AMBULATORY_CARE_PROVIDER_SITE_OTHER): Payer: BC Managed Care – PPO | Admitting: Family Medicine

## 2013-10-07 ENCOUNTER — Encounter: Payer: Self-pay | Admitting: Family Medicine

## 2013-10-07 VITALS — BP 135/90 | HR 72 | Temp 98.2°F | Ht 61.75 in | Wt 213.2 lb

## 2013-10-07 DIAGNOSIS — I10 Essential (primary) hypertension: Secondary | ICD-10-CM

## 2013-10-07 DIAGNOSIS — Z0000 Encounter for general adult medical examination without abnormal findings: Secondary | ICD-10-CM

## 2013-10-07 DIAGNOSIS — E669 Obesity, unspecified: Secondary | ICD-10-CM

## 2013-10-07 DIAGNOSIS — E039 Hypothyroidism, unspecified: Secondary | ICD-10-CM

## 2013-10-07 DIAGNOSIS — R7309 Other abnormal glucose: Secondary | ICD-10-CM

## 2013-10-07 DIAGNOSIS — Z23 Encounter for immunization: Secondary | ICD-10-CM

## 2013-10-07 MED ORDER — LEVOTHYROXINE SODIUM 175 MCG PO TABS: ORAL_TABLET | ORAL | Status: DC

## 2013-10-07 NOTE — Progress Notes (Signed)
Pre visit review using our clinic review tool, if applicable. No additional management support is needed unless otherwise documented below in the visit note. 

## 2013-10-07 NOTE — Progress Notes (Signed)
Subjective:    Patient ID: Andrea Cooper, female    DOB: 11-17-1950, 63 y.o.   MRN: [id number]  HPI Here for health maintenance exam and to review chronic medical problems    Wt  up 11 lb with bmi of 39 She is not able to exercise yet due to foot recovery - getting there / getting better  Had surgery  Needs to find exercise she can do   She is interested in a part time job- may look for something    Has had a hysterectomy , last pap 06  Zoster status- interested in vaccine - she needs to find out if it is paid for   Flu vaccine - will get that today   Mammogram 6/15 nl  Self exam-no lumps or changes   Td 4/07  colonosc 8/15 nl - 10 year recall - happy with that   bp is up a bit for her today  No cp or palpitations or headaches or edema  No side effects to medicines  BP Readings from Last 3 Encounters:  10/07/13 144/92  05/12/13 126/82  04/15/12 124/72    Eating is terrible lately  Both the wrong thing and too much  She does emotionally eat -out of boredom as well   Hyperglycemia  Lab Results  Component Value Date   HGBA1C 6.7* 09/30/2013   this is over the DM threshold  She needs to eat better -thinks she can get this down   Hypothyroidism  Pt has no clinical changes No change in energy level/ hair or skin/ edema and no tremor Lab Results  Component Value Date   TSH 1.43 09/30/2013      Mood - doing pretty well  Sees Dr Toy Care- has appt upcoming in Oct  Stress of caregiving for her mother - needs to put her in skilled nursing   Cholesterol  Lab Results  Component Value Date   CHOL 191 09/30/2013   CHOL 148 06/18/2013   CHOL 165 05/12/2013   Lab Results  Component Value Date   HDL 43.20 09/30/2013   HDL 41.40 06/18/2013   HDL 41.50 05/12/2013   Lab Results  Component Value Date   LDLCALC 124* 09/30/2013   LDLCALC 85 06/18/2013   LDLCALC 103* 05/12/2013   Lab Results  Component Value Date   TRIG 121.0 09/30/2013   TRIG 109.0 06/18/2013   TRIG 103.0  05/12/2013   Lab Results  Component Value Date   CHOLHDL 4 09/30/2013   CHOLHDL 4 06/18/2013   CHOLHDL 4 05/12/2013   Lab Results  Component Value Date   LDLDIRECT 160.7 01/14/2007   LDLDIRECT 202.7 08/01/2006   significant inc  She knows what to change in diet   Review of Systems Review of Systems  Constitutional: Negative for fever, appetite change, fatigue and unexpected weight change.  Eyes: Negative for pain and visual disturbance.  Respiratory: Negative for cough and shortness of breath.   Cardiovascular: Negative for cp or palpitations    Gastrointestinal: Negative for nausea, diarrhea and constipation.  Genitourinary: Negative for urgency and frequency.  Skin: Negative for pallor or rash   Neurological: Negative for weakness, light-headedness, numbness and headaches.  Hematological: Negative for adenopathy. Does not bruise/bleed easily.  Psychiatric/Behavioral: Negative for dysphoric mood. The patient is not nervous/anxious.         Objective:   Physical Exam  Constitutional: She appears well-developed and well-nourished. No distress.  obese and well appearing   HENT:  Head: Normocephalic  and atraumatic.  Right Ear: External ear normal.  Left Ear: External ear normal.  Nose: Nose normal.  Mouth/Throat: Oropharynx is clear and moist.  Eyes: Conjunctivae and EOM are normal. Pupils are equal, round, and reactive to light. Right eye exhibits no discharge. Left eye exhibits no discharge. No scleral icterus.  Neck: Normal range of motion. Neck supple. No JVD present. No thyromegaly present.  Cardiovascular: Normal rate, regular rhythm, normal heart sounds and intact distal pulses.  Exam reveals no gallop.   Pulmonary/Chest: Effort normal and breath sounds normal. No respiratory distress. She has no wheezes. She has no rales.  Abdominal: Soft. Bowel sounds are normal. She exhibits no distension and no mass. There is no tenderness.  Genitourinary: No breast swelling,  tenderness, discharge or bleeding.  Breast exam: No mass, nodules, thickening, tenderness, bulging, retraction, inflamation, nipple discharge or skin changes noted.  No axillary or clavicular LA.      Musculoskeletal: She exhibits no edema and no tenderness.  Lymphadenopathy:    She has no cervical adenopathy.  Neurological: She is alert. She has normal reflexes. No cranial nerve deficit. She exhibits normal muscle tone. Coordination normal.  Skin: Skin is warm and dry. No rash noted. No erythema. No pallor.  Psychiatric: She has a normal mood and affect.          Assessment & Plan:   Problem List Items Addressed This Visit     Cardiovascular and Mediastinum   HYPERTENSION - Primary      bp is up with weight gain   BP Readings from Last 1 Encounters:  10/07/13 135/90   Disc lifstyle change with low sodium diet and exercise   Will work on wt loss and f/u 3 mo for re check Labs reviewed       Endocrine   HYPOTHYROIDISM      Hypothyroidism  Pt has no clinical changes No change in energy level/ hair or skin/ edema and no tremor Lab Results  Component Value Date   TSH 1.43 09/30/2013        Relevant Medications      levothyroxine (SYNTHROID, LEVOTHROID) tablet     Other   OBESITY     Discussed how this problem influences overall health and the risks it imposes  Reviewed plan for weight loss with lower calorie diet (via better food choices and also portion control or program like weight watchers) and exercise building up to or more than 30 minutes 5 days per week including some aerobic activity   She will also talk to Dr Toy Care about counseling for emotional eating     HYPERGLYCEMIA      Lab Results  Component Value Date   HGBA1C 6.7* 09/30/2013   This is up with wt gain   Pt plans to return to DM diet and exercise for wt loss  Re check 3 mo    Routine general medical examination at a health care facility     Reviewed health habits including diet and exercise  and skin cancer prevention Reviewed appropriate screening tests for age  Also reviewed health mt list, fam hx and immunization status , as well as social and family history   Flu shot today She will check on zostavax coverage  Labs rev     Other Visit Diagnoses   Need for prophylactic vaccination and inoculation against influenza        Relevant Orders       Flu Vaccine QUAD 36+ mos PF IM (Fluarix  Quad PF) (Completed)

## 2013-10-07 NOTE — Patient Instructions (Signed)
If you are interested in a shingles/zoster vaccine - call your insurance to check on coverage,( you should not get it within 1 month of other vaccines) , then call us for a prescription  for it to take to a pharmacy that gives the shot , or make a nurse visit to get it here depending on your coverage  blood pressure is up a bit - try to eat healthy/ eat less sodium and exercise when able  Also watch sugar and fat in diet and work on weight loss  Flu vaccine today   Follow up in 3 months with labs prior      DASH Eating Plan DASH stands for "Dietary Approaches to Stop Hypertension." The DASH eating plan is a healthy eating plan that has been shown to reduce high blood pressure (hypertension). Additional health benefits may include reducing the risk of type 2 diabetes mellitus, heart disease, and stroke. The DASH eating plan may also help with weight loss. WHAT DO I NEED TO KNOW ABOUT THE DASH EATING PLAN? For the DASH eating plan, you will follow these general guidelines:  Choose foods with a percent daily value for sodium of less than 5% (as listed on the food label).  Use salt-free seasonings or herbs instead of table salt or sea salt.  Check with your health care provider or pharmacist before using salt substitutes.  Eat lower-sodium products, often labeled as "lower sodium" or "no salt added."  Eat fresh foods.  Eat more vegetables, fruits, and low-fat dairy products.  Choose whole grains. Look for the word "whole" as the first word in the ingredient list.  Choose fish and skinless chicken or [place] more often than red meat. Limit fish, poultry, and meat to 6 oz (170 g) each day.  Limit sweets, desserts, sugars, and sugary drinks.  Choose heart-healthy fats.  Limit cheese to 1 oz (28 g) per day.  Eat more home-cooked food and less restaurant, buffet, and fast food.  Limit fried foods.  Cook foods using methods other than frying.  Limit canned vegetables. If you do use  them, rinse them well to decrease the sodium.  When eating at a restaurant, ask that your food be prepared with less salt, or no salt if possible. WHAT FOODS CAN I EAT? Seek help from a dietitian for individual calorie needs. Grains Whole grain or whole wheat bread. Brown rice. Whole grain or whole wheat pasta. Quinoa, bulgur, and whole grain cereals. Low-sodium cereals. Corn or whole wheat flour tortillas. Whole grain cornbread. Whole grain crackers. Low-sodium crackers. Vegetables Fresh or frozen vegetables (raw, steamed, roasted, or grilled). Low-sodium or reduced-sodium tomato and vegetable juices. Low-sodium or reduced-sodium tomato sauce and paste. Low-sodium or reduced-sodium canned vegetables.  Fruits All fresh, canned (in natural juice), or frozen fruits. Meat and Other Protein Products Ground beef (85% or leaner), grass-fed beef, or beef trimmed of fat. Skinless chicken or [place]. Ground chicken or [place]. Pork trimmed of fat. All fish and seafood. Eggs. Dried beans, peas, or lentils. Unsalted nuts and seeds. Unsalted canned beans. Dairy Low-fat dairy products, such as skim or 1% milk, 2% or reduced-fat cheeses, low-fat ricotta or cottage cheese, or plain low-fat yogurt. Low-sodium or reduced-sodium cheeses. Fats and Oils Tub margarines without trans fats. Light or reduced-fat mayonnaise and salad dressings (reduced sodium). Avocado. Safflower, olive, or canola oils. Natural peanut or almond butter. Other Unsalted popcorn and pretzels. The items listed above may not be a complete list of recommended foods or beverages. Contact  your dietitian for more options. WHAT FOODS ARE NOT RECOMMENDED? Grains White bread. White pasta. White rice. Refined cornbread. Bagels and croissants. Crackers that contain trans fat. Vegetables Creamed or fried vegetables. Vegetables in a cheese sauce. Regular canned vegetables. Regular canned tomato sauce and paste. Regular tomato and vegetable  juices. Fruits Dried fruits. Canned fruit in light or heavy syrup. Fruit juice. Meat and Other Protein Products Fatty cuts of meat. Ribs, chicken wings, bacon, sausage, bologna, salami, chitterlings, fatback, hot dogs, bratwurst, and packaged luncheon meats. Salted nuts and seeds. Canned beans with salt. Dairy Whole or 2% milk, cream, half-and-half, and cream cheese. Whole-fat or sweetened yogurt. Full-fat cheeses or blue cheese. Nondairy creamers and whipped toppings. Processed cheese, cheese spreads, or cheese curds. Condiments Onion and garlic salt, seasoned salt, table salt, and sea salt. Canned and packaged gravies. Worcestershire sauce. Tartar sauce. Barbecue sauce. Teriyaki sauce. Soy sauce, including reduced sodium. Steak sauce. Fish sauce. Oyster sauce. Cocktail sauce. Horseradish. Ketchup and mustard. Meat flavorings and tenderizers. Bouillon cubes. Hot sauce. Tabasco sauce. Marinades. Taco seasonings. Relishes. Fats and Oils Butter, stick margarine, lard, shortening, ghee, and bacon fat. Coconut, palm kernel, or palm oils. Regular salad dressings. Other Pickles and olives. Salted popcorn and pretzels. The items listed above may not be a complete list of foods and beverages to avoid. Contact your dietitian for more information. WHERE CAN I FIND MORE INFORMATION? National Heart, Lung, and Blood Institute: travelstabloid.com Document Released: 12/22/2010 Document Revised: 05/19/2013 Document Reviewed: 11/06/2012 [facility] Patient Information 2015 [place], [place]. This information is not intended to replace advice given to you by your health care provider. Make sure you discuss any questions you have with your health care provider.

## 2013-10-07 NOTE — Assessment & Plan Note (Signed)
bp is up with weight gain   BP Readings from Last 1 Encounters:  10/07/13 135/90   Disc lifstyle change with low sodium diet and exercise   Will work on wt loss and f/u 3 mo for re check Labs reviewed

## 2013-10-07 NOTE — Assessment & Plan Note (Signed)
Discussed how this problem influences overall health and the risks it imposes  Reviewed plan for weight loss with lower calorie diet (via better food choices and also portion control or program like weight watchers) and exercise building up to or more than 30 minutes 5 days per week including some aerobic activity   She will also talk to Dr Toy Care about counseling for emotional eating

## 2013-10-07 NOTE — Assessment & Plan Note (Signed)
Lab Results  Component Value Date   HGBA1C 6.7* 09/30/2013   This is up with wt gain   Pt plans to return to DM diet and exercise for wt loss  Re check 3 mo

## 2013-10-07 NOTE — Assessment & Plan Note (Signed)
Hypothyroidism  Pt has no clinical changes No change in energy level/ hair or skin/ edema and no tremor Lab Results  Component Value Date   TSH 1.43 09/30/2013

## 2013-10-07 NOTE — Assessment & Plan Note (Signed)
Reviewed health habits including diet and exercise and skin cancer prevention Reviewed appropriate screening tests for age  Also reviewed health mt list, fam hx and immunization status , as well as social and family history   Flu shot today She will check on zostavax coverage  Labs rev

## 2013-12-09 ENCOUNTER — Telehealth: Payer: Self-pay | Admitting: Family Medicine

## 2013-12-09 NOTE — Telephone Encounter (Signed)
-----   Message from Ellamae Sia sent at 12/08/2013  4:18 PM EST ----- Regarding: Lab orders for Wednesday, 11.25.15 3 month f/u labs

## 2013-12-09 NOTE — Telephone Encounter (Signed)
I think this is a month early for 3 mo labs ?

## 2013-12-10 ENCOUNTER — Other Ambulatory Visit (INDEPENDENT_AMBULATORY_CARE_PROVIDER_SITE_OTHER): Payer: BC Managed Care – PPO

## 2013-12-10 DIAGNOSIS — R739 Hyperglycemia, unspecified: Secondary | ICD-10-CM

## 2013-12-10 LAB — BASIC METABOLIC PANEL
BUN: 15 mg/dL (ref 6–23)
CALCIUM: 9.3 mg/dL (ref 8.4–10.5)
CO2: 29 mEq/L (ref 19–32)
Chloride: 98 mEq/L (ref 96–112)
Creatinine, Ser: 0.9 mg/dL (ref 0.4–1.2)
GFR: 66.79 mL/min (ref >60.00)
GLUCOSE: 116 mg/dL — AB (ref 70–99)
Potassium: 3.1 mEq/L — ABNORMAL LOW (ref 3.5–5.1)
SODIUM: 136 meq/L (ref 135–145)

## 2013-12-10 LAB — HEMOGLOBIN A1C: HEMOGLOBIN A1C: 6.6 % — AB (ref 4.6–6.5)

## 2013-12-10 NOTE — Telephone Encounter (Signed)
I spoke with pt and she say's that she needed to come in now, because her insurance changes on 12/1, and also on 01/16/14. She doesn't want to have to pay another deductible so she wants to come today.

## 2013-12-15 ENCOUNTER — Ambulatory Visit (INDEPENDENT_AMBULATORY_CARE_PROVIDER_SITE_OTHER): Payer: BC Managed Care – PPO | Admitting: Family Medicine

## 2013-12-15 ENCOUNTER — Encounter: Payer: Self-pay | Admitting: Family Medicine

## 2013-12-15 VITALS — BP 122/80 | HR 66 | Temp 97.5°F | Ht 61.75 in | Wt 209.8 lb

## 2013-12-15 DIAGNOSIS — I10 Essential (primary) hypertension: Secondary | ICD-10-CM

## 2013-12-15 DIAGNOSIS — Z23 Encounter for immunization: Secondary | ICD-10-CM

## 2013-12-15 DIAGNOSIS — E876 Hypokalemia: Secondary | ICD-10-CM

## 2013-12-15 DIAGNOSIS — R739 Hyperglycemia, unspecified: Secondary | ICD-10-CM

## 2013-12-15 MED ORDER — PRAVASTATIN SODIUM 20 MG PO TABS: 20.0000 mg | ORAL_TABLET | Freq: Every day | ORAL | Status: DC

## 2013-12-15 MED ORDER — LEVOTHYROXINE SODIUM 175 MCG PO TABS: ORAL_TABLET | ORAL | Status: DC

## 2013-12-15 MED ORDER — HYDROCHLOROTHIAZIDE 25 MG PO TABS: 25.0000 mg | ORAL_TABLET | Freq: Every day | ORAL | Status: DC

## 2013-12-15 MED ORDER — POTASSIUM CHLORIDE ER 10 MEQ PO TBCR: 10.0000 meq | EXTENDED_RELEASE_TABLET | Freq: Every day | ORAL | Status: DC

## 2013-12-15 MED ORDER — PROPRANOLOL HCL ER 80 MG PO CP24: 80.0000 mg | ORAL_CAPSULE | Freq: Every day | ORAL | Status: DC

## 2013-12-15 NOTE — Progress Notes (Signed)
Subjective:    Patient ID: Andrea Cooper, female    DOB: Sep 16, 1950, 63 y.o.   MRN: [id number]  HPI Here for f/u of chronic health problems  Obesity Lost 4 lb since last visit  Working hard at it  Gained wt and then lost 10 lb after putting mother in skilled nursing- less stress and more time to care for herself  bmi of 38  bp is stable today  No cp or palpitations or headaches or edema  No side effects to medicines  BP Readings from Last 3 Encounters:  12/15/13 142/88  10/07/13 135/90  05/12/13 126/82       Chemistry      Component Value Date/Time   NA 136 12/10/2013 1010   K 3.1* 12/10/2013 1010   CL 98 12/10/2013 1010   CO2 29 12/10/2013 1010   BUN 15 12/10/2013 1010   CREATININE 0.9 12/10/2013 1010      Component Value Date/Time   CALCIUM 9.3 12/10/2013 1010   ALKPHOS 45 09/30/2013 0813   AST 26 09/30/2013 0813   ALT 28 09/30/2013 0813   BILITOT 0.7 09/30/2013 0813     will px kcl 10 meq daily today Low K likely from diuretic No cramps or other symptoms   Hyperglycemia Lab Results  Component Value Date   HGBA1C 6.6* 12/10/2013   Down from 6.7  Eating better now - lean protein with no sugar or chocolate and healthier carbs   Needs zoster vaccine today- found out it is covered if admin in office  Patient Active Problem List   Diagnosis Date Noted  . Other screening mammogram 06/07/2011  . Routine general medical examination at a health care facility 05/31/2011  . SLEEP APNEA 09/13/2009  . OBESITY 01/13/2009  . POSTMENOPAUSAL STATUS 10/01/2008  . BACK PAIN, LUMBAR, WITH RADICULOPATHY 08/10/2008  . SWEATING 07/15/2008  . Hyperglycemia 05/22/2007  . TOBACCO USE, QUIT 05/22/2007  . CEREBRAL ANEURYSM 08/09/2006  . HYPOTHYROIDISM 07/25/2006  . HYPERLIPIDEMIA 07/25/2006  . ANXIETY 07/25/2006  . DEPRESSION 07/25/2006  . Essential hypertension 07/25/2006  . IBS 07/25/2006  . OVEREATING 07/25/2006  . SKIN CANCER, HX OF 07/25/2006   Past Medical  History  Diagnosis Date  . History of anxiety   . History of depression   . History of hyperlipidemia   . History of hypothyroidism   . History of skin cancer     hx of melanoma left lower arm, recurrance  . History of cerebral aneurysm repair     history of cerebral aneurysm   . History of hyperglycemia    Past Surgical History  Procedure Laterality Date  . Appendectomy    . Total abdominal hysterectomy w/ bilateral salpingoophorectomy    . Tonsillectomy    . Cardiolite  12/1997    normal EF 68%  . Eye surgery      RR  . Foot surgery  02/1998    bilateral  . Cerebral aneurysm repair     History  Substance Use Topics  . Smoking status: Former Research scientist (life sciences)  . Smokeless tobacco: Former Systems developer    Quit date: 06/07/2006  . Alcohol Use: 0.0 oz/week    0 Not specified per week     Comment: occ   Family History  Problem Relation Age of Onset  . Diabetes Father   . Diabetes Mother   . Depression Father   . CAD Father   . Diverticulosis Mother   . Adrenal disorder Mother   . Breast cancer  Other     distant relatives (all on mother's side)  . Cerebral aneurysm Sister    Allergies  Allergen Reactions  . Codeine     REACTION: nausea and vomiting   Current Outpatient Prescriptions on File Prior to Visit  Medication Sig Dispense Refill  . buPROPion (WELLBUTRIN XL) 300 MG 24 hr tablet Take 300 mg by mouth daily.    . clobetasol (TEMOVATE) 0.05 % external solution as needed.    Marland Kitchen FLUoxetine (PROZAC) 20 MG capsule Take 60 mg by mouth daily.      No current facility-administered medications on file prior to visit.      Review of Systems Review of Systems  Constitutional: Negative for fever, appetite change, fatigue and unexpected weight change.  Eyes: Negative for pain and visual disturbance.  Respiratory: Negative for cough and shortness of breath.   Cardiovascular: Negative for cp or palpitations    Gastrointestinal: Negative for nausea, diarrhea and constipation.    Genitourinary: Negative for urgency and frequency.  Skin: Negative for pallor or rash   Neurological: Negative for weakness, light-headedness, numbness and headaches.  Hematological: Negative for adenopathy. Does not bruise/bleed easily.  Psychiatric/Behavioral: Negative for dysphoric mood. The patient is not nervous/anxious.         Objective:   Physical Exam  Constitutional: She appears well-developed and well-nourished. No distress.  obese and well appearing   HENT:  Head: Normocephalic and atraumatic.  Mouth/Throat: Oropharynx is clear and moist.  Eyes: Conjunctivae and EOM are normal. Pupils are equal, round, and reactive to light. No scleral icterus.  Neck: Normal range of motion. Neck supple. No JVD present. Carotid bruit is not present. No thyromegaly present.  Cardiovascular: Normal rate, regular rhythm, normal heart sounds and intact distal pulses.  Exam reveals no gallop.   Pulmonary/Chest: Effort normal and breath sounds normal. No respiratory distress. She has no wheezes. She exhibits no tenderness.  Abdominal: Soft. Bowel sounds are normal. She exhibits no distension, no abdominal bruit and no mass. There is no tenderness.  Musculoskeletal: Normal range of motion. She exhibits no edema or tenderness.  Lymphadenopathy:    She has no cervical adenopathy.  Neurological: She is alert. She has normal reflexes. No cranial nerve deficit. She exhibits normal muscle tone. Coordination normal.  Skin: Skin is warm and dry. No rash noted. No erythema. No pallor.  Psychiatric: She has a normal mood and affect.          Assessment & Plan:   Problem List Items Addressed This Visit      Cardiovascular and Mediastinum   Essential hypertension - Primary    bp in fair control at this time  BP Readings from Last 1 Encounters:  12/15/13 122/80   No changes needed Disc lifstyle change with low sodium diet and exercise  Labs reviewed     Relevant Medications      pravastatin  (PRAVACHOL) tablet      hydrochlorothiazide tablet      propranolol (INDERAL LA) 24 hr capsule     Other   Hyperglycemia    Lab Results  Component Value Date   HGBA1C 6.6* 12/10/2013   Improving slowly Much better habits F/u 6 mo    Hypokalemia    Lab Results  Component Value Date   K 3.1* 12/10/2013    Will start KCL 10 meq daily No symptoms Likely due to diuretic  Continue to follow      Other Visit Diagnoses    Need for zoster  vaccination        Relevant Orders       Varicella-zoster vaccine subcutaneous (Completed)

## 2013-12-15 NOTE — Assessment & Plan Note (Signed)
bp in fair control at this time  BP Readings from Last 1 Encounters:  12/15/13 122/80   No changes needed Disc lifstyle change with low sodium diet and exercise  Labs reviewed

## 2013-12-15 NOTE — Patient Instructions (Addendum)
Shingles vaccine today  Start potassium one pill daily with food  A1C is coming down  [place] job with health habits and weight loss  Keep up the good work   Follow up in 6 months for annual exam

## 2013-12-15 NOTE — Assessment & Plan Note (Signed)
Lab Results  Component Value Date   HGBA1C 6.6* 12/10/2013   Improving slowly Much better habits F/u 6 mo

## 2013-12-15 NOTE — Progress Notes (Signed)
Pre visit review using our clinic review tool, if applicable. No additional management support is needed unless otherwise documented below in the visit note. 

## 2013-12-15 NOTE — Assessment & Plan Note (Signed)
Lab Results  Component Value Date   K 3.1* 12/10/2013    Will start KCL 10 meq daily No symptoms Likely due to diuretic  Continue to follow

## 2013-12-24 ENCOUNTER — Other Ambulatory Visit: Payer: Self-pay

## 2013-12-24 MED ORDER — HYDROCHLOROTHIAZIDE 25 MG PO TABS: 25.0000 mg | ORAL_TABLET | Freq: Every day | ORAL | Status: DC

## 2013-12-24 MED ORDER — PROPRANOLOL HCL ER 80 MG PO CP24: 80.0000 mg | ORAL_CAPSULE | Freq: Every day | ORAL | Status: DC

## 2013-12-24 MED ORDER — LEVOTHYROXINE SODIUM 175 MCG PO TABS: ORAL_TABLET | ORAL | Status: DC

## 2013-12-24 MED ORDER — POTASSIUM CHLORIDE ER 10 MEQ PO TBCR: 10.0000 meq | EXTENDED_RELEASE_TABLET | Freq: Every day | ORAL | Status: DC

## 2013-12-24 MED ORDER — PRAVASTATIN SODIUM 20 MG PO TABS: 20.0000 mg | ORAL_TABLET | Freq: Every day | ORAL | Status: DC

## 2013-12-24 NOTE — Telephone Encounter (Signed)
Please refill all for a year  

## 2013-12-24 NOTE — Telephone Encounter (Signed)
As of 12/16/13 pts insurance changed and so did mail order pharmacy; pt is establishing with [facility] mail order and needs rx for  Meds refilled on 12/15/13.pt request cb when ready for pick up.

## 2013-12-25 NOTE — Telephone Encounter (Signed)
Pt notified [facility] ready for pick-up 

## 2013-12-26 ENCOUNTER — Other Ambulatory Visit: Payer: Self-pay | Admitting: *Deleted

## 2013-12-30 ENCOUNTER — Telehealth: Payer: Self-pay

## 2013-12-30 ENCOUNTER — Other Ambulatory Visit: Payer: BC Managed Care – PPO

## 2013-12-30 NOTE — Telephone Encounter (Signed)
PLEASE NOTE: All timestamps contained within this report are represented as [place] Standard Time. CONFIDENTIALTY NOTICE: This fax transmission is intended only for the addressee. It contains information that is legally privileged, confidential or otherwise protected from use or disclosure. If you are not the intended recipient, you are strictly prohibited from reviewing, disclosing, copying using or disseminating any of this information or taking any action in reliance on or regarding this information. If you have received this fax in error, please notify us immediately by telephone so that we can arrange for its return to [place]. Phone: [phone], Toll-Free: [phone], Fax: [phone] Page: 1 of 2 Call Id: [id number] Andrea Cooper Patient Name: Andrea Cooper Gender: Female DOB: [birth year] Age: 63 Y 9 D Return Phone Number: [phone] (Primary), [phone] (Secondary) Address: [address] City/State/Zip: [place]certain [place] [address] Client [facility] Client Client Site [place], [place] Contact Type Call Call Type Triage / Clinical Relationship To Patient Self Return Phone Number [phone] (Primary) Chief Complaint Nasal Congestion Initial Comment Caller states c/o nasal congestion, coughing, sore throat PreDisposition Did not know what to do Nurse Assessment Nurse: Tamala Julian, RN, Janett Billow Date/Time (Eastern Time): 12/30/2013 2:09:32 PM Confirm and document reason for call. If symptomatic, describe symptoms. ---Caller states c/o nasal congestion, coughing, "tickling" throat. Has the patient traveled out of the country within the last 30 days? ---Not Applicable Does the patient require triage? ---Yes Related visit to physician within the last 2 weeks? ---No Does the PT have any chronic conditions? (i.e. diabetes, asthma,  etc.) ---Yes List chronic conditions. ---HTN, thyroid Guidelines Guideline Title Affirmed Question Affirmed Notes Nurse Date/Time Eilene Ghazi Time) Common Cold Cold with no complications (all triage questions negative) Tamala Julian, RN, Janett Billow 12/30/2013 2:11:02 PM Disp. Time Eilene Ghazi Time) Disposition Final User 12/30/2013 1:47:48 PM Send To Clinical Follow Up Rich Brave, Amy 12/30/2013 2:16:41 PM Home Care Yes Tamala Julian, RN, Janet Berlin Understands: Yes Disagree/Comply: Comply Care Advice Given Per Guideline PLEASE NOTE: All timestamps contained within this report are represented as [place] Standard Time. CONFIDENTIALTY NOTICE: This fax transmission is intended only for the addressee. It contains information that is legally privileged, confidential or otherwise protected from use or disclosure. If you are not the intended recipient, you are strictly prohibited from reviewing, disclosing, copying using or disseminating any of this information or taking any action in reliance on or regarding this information. If you have received this fax in error, please notify us immediately by telephone so that we can arrange for its return to [place]. Phone: [phone], Toll-Free: [phone], Fax: [phone] Page: 2 of 2 Call Id: [id number] Care Advice Given Per Guideline HOME CARE: You should be able to treat this at home. REASSURANCE: * Colds are very common and may make you feel uncomfortable. * Colds are caused by viruses, and no medicine or 'shot' will cure an uncomplicated cold. FOR A STUFFY NOSE - USE NASAL WASHES: * Introduction: Saline (salt water) nasal irrigation (nasal wash) is an effective and simple home remedy for treating stuffy nose and sinus congestion. The nose can be irrigated by pouring, spraying, or squirting salt water into the nose and then letting it run back out. * How it Helps: The salt water rinses out excess mucus, washes out any irritants (dust, allergens) that might be present,  and moistens the nasal cavity. * Methods: There are several ways to perform nasal irrigation.  You can use a saline nasal spray bottle (available over-the-counter), a rubber ear syringe, a medical syringe without the needle, or a NETI POT. MEDICINES FOR STUFFY OR RUNNY NOSE: * Most cold medicines that are available over-the-counter (OTC) are not helpful. * Antihistamines: Are only helpful if you also have nasal allergies. * If you have a very runny nose and you really think you need a medicine, you can try using a nasal decongestant for a couple days. * PHENYLEPHRINE NASAL DROPS (Neo-Synephrine) are available OTC. Clean out the nose before using. Spray each nostril once, wait one minute for absorption, and then spray a second time. * OXYMETAZOLINE NASAL DROPS (Afrin) are available OTC. Clean out the nose before using. Spray each nostril once, wait one minute for absorption, and then spray a second time. * PHENYLEPHRINE (Sudafed PE) is available OTC in pill form. Typical adult dosage is two 30 mg tablets every 6 hours. NASAL DECONGESTANTS FOR A VERY STUFFY NOSE: * Hydrate: drink extra liquids. * Sore throat: throat lozenges, hard candy or warm chicken broth. TREATMENT FOR ASSOCIATED SYMPTOMS OF COLDS: HUMIDIFIER: If the air in your home is dry, use a humidifier. EXPECTED COURSE: * Nasal discharge 7-14 days CALL BACK IF: * Fever lasts over 3 days * Runny nose lasts over 10 days CARE ADVICE given per Colds (Adult) guideline. * You become short of breath After Care Instructions Given Call Event Type User Date / Time Description

## 2014-01-06 ENCOUNTER — Ambulatory Visit: Payer: BC Managed Care – PPO | Admitting: Family Medicine

## 2014-02-04 DIAGNOSIS — H01006 Unspecified blepharitis left eye, unspecified eyelid: Secondary | ICD-10-CM | POA: Diagnosis not present

## 2014-02-04 DIAGNOSIS — H2513 Age-related nuclear cataract, bilateral: Secondary | ICD-10-CM | POA: Diagnosis not present

## 2014-02-04 DIAGNOSIS — H01003 Unspecified blepharitis right eye, unspecified eyelid: Secondary | ICD-10-CM | POA: Diagnosis not present

## 2014-05-01 DIAGNOSIS — I671 Cerebral aneurysm, nonruptured: Secondary | ICD-10-CM | POA: Diagnosis not present

## 2014-05-06 ENCOUNTER — Other Ambulatory Visit: Payer: Self-pay | Admitting: Dermatology

## 2014-05-06 DIAGNOSIS — L821 Other seborrheic keratosis: Secondary | ICD-10-CM | POA: Diagnosis not present

## 2014-05-06 DIAGNOSIS — D1801 Hemangioma of skin and subcutaneous tissue: Secondary | ICD-10-CM | POA: Diagnosis not present

## 2014-05-06 DIAGNOSIS — L570 Actinic keratosis: Secondary | ICD-10-CM | POA: Diagnosis not present

## 2014-05-06 DIAGNOSIS — Z85820 Personal history of malignant melanoma of skin: Secondary | ICD-10-CM | POA: Diagnosis not present

## 2014-05-06 DIAGNOSIS — D485 Neoplasm of uncertain behavior of skin: Secondary | ICD-10-CM | POA: Diagnosis not present

## 2014-05-06 DIAGNOSIS — L814 Other melanin hyperpigmentation: Secondary | ICD-10-CM | POA: Diagnosis not present

## 2014-05-14 DIAGNOSIS — F331 Major depressive disorder, recurrent, moderate: Secondary | ICD-10-CM | POA: Diagnosis not present

## 2014-07-07 ENCOUNTER — Telehealth: Payer: Self-pay | Admitting: Family Medicine

## 2014-07-07 DIAGNOSIS — I10 Essential (primary) hypertension: Secondary | ICD-10-CM

## 2014-07-07 DIAGNOSIS — E785 Hyperlipidemia, unspecified: Secondary | ICD-10-CM

## 2014-07-07 DIAGNOSIS — R739 Hyperglycemia, unspecified: Secondary | ICD-10-CM

## 2014-07-07 DIAGNOSIS — E039 Hypothyroidism, unspecified: Secondary | ICD-10-CM

## 2014-07-07 NOTE — Telephone Encounter (Signed)
lab

## 2014-07-08 ENCOUNTER — Other Ambulatory Visit (INDEPENDENT_AMBULATORY_CARE_PROVIDER_SITE_OTHER): Payer: Medicare Other

## 2014-07-08 DIAGNOSIS — I10 Essential (primary) hypertension: Secondary | ICD-10-CM

## 2014-07-08 DIAGNOSIS — R739 Hyperglycemia, unspecified: Secondary | ICD-10-CM

## 2014-07-08 DIAGNOSIS — E785 Hyperlipidemia, unspecified: Secondary | ICD-10-CM | POA: Diagnosis not present

## 2014-07-08 DIAGNOSIS — E039 Hypothyroidism, unspecified: Secondary | ICD-10-CM

## 2014-07-08 LAB — LIPID PANEL
CHOLESTEROL: 179 mg/dL (ref 0–200)
HDL: 46.7 mg/dL (ref >39.00)
LDL Cholesterol: 108 mg/dL — ABNORMAL HIGH (ref 0–99)
NonHDL: 132.3
TRIGLYCERIDES: 121 mg/dL (ref 0.0–149.0)
Total CHOL/HDL Ratio: 4
VLDL: 24.2 mg/dL (ref 0.0–40.0)

## 2014-07-08 LAB — COMPREHENSIVE METABOLIC PANEL
ALK PHOS: 51 U/L (ref 39–117)
ALT: 27 U/L (ref 0–35)
AST: 18 U/L (ref 0–37)
Albumin: 4.4 g/dL (ref 3.5–5.2)
BUN: 15 mg/dL (ref 6–23)
CO2: 32 mEq/L (ref 19–32)
Calcium: 9.8 mg/dL (ref 8.4–10.5)
Chloride: 103 mEq/L (ref 96–112)
Creatinine, Ser: 0.77 mg/dL (ref 0.40–1.20)
GFR: 79.83 mL/min (ref >60.00)
Glucose, Bld: 158 mg/dL — ABNORMAL HIGH (ref 70–99)
POTASSIUM: 4.9 meq/L (ref 3.5–5.1)
SODIUM: 142 meq/L (ref 135–145)
Total Bilirubin: 0.5 mg/dL (ref 0.2–1.2)
Total Protein: 7.4 g/dL (ref 6.0–8.3)

## 2014-07-08 LAB — CBC WITH DIFFERENTIAL/PLATELET
Basophils Absolute: 0 10*3/uL (ref 0.0–0.1)
Basophils Relative: 0.6 % (ref 0.0–3.0)
EOS PCT: 0.9 % (ref 0.0–5.0)
Eosinophils Absolute: 0.1 10*3/uL (ref 0.0–0.7)
HCT: 42.4 % (ref 36.0–46.0)
HEMOGLOBIN: 14.2 g/dL (ref 12.0–15.0)
Lymphocytes Relative: 31.4 % (ref 12.0–46.0)
Lymphs Abs: 2.1 10*3/uL (ref 0.7–4.0)
MCHC: 33.6 g/dL (ref 30.0–36.0)
MCV: 89.5 fl (ref 78.0–100.0)
MONOS PCT: 7.9 % (ref 3.0–12.0)
Monocytes Absolute: 0.5 10*3/uL (ref 0.1–1.0)
NEUTROS ABS: 4 10*3/uL (ref 1.4–7.7)
Neutrophils Relative %: 59.2 % (ref 43.0–77.0)
Platelets: 278 10*3/uL (ref 150.0–400.0)
RBC: 4.73 Mil/uL (ref 3.87–5.11)
RDW: 14 % (ref 11.5–15.5)
WBC: 6.7 10*3/uL (ref 4.0–10.5)

## 2014-07-08 LAB — TSH: TSH: 2.11 u[IU]/mL (ref 0.35–4.50)

## 2014-07-08 LAB — HEMOGLOBIN A1C: Hgb A1c MFr Bld: 6.2 % (ref 4.6–6.5)

## 2014-07-14 ENCOUNTER — Ambulatory Visit (INDEPENDENT_AMBULATORY_CARE_PROVIDER_SITE_OTHER): Payer: Medicare Other | Admitting: Family Medicine

## 2014-07-14 ENCOUNTER — Encounter: Payer: Self-pay | Admitting: Family Medicine

## 2014-07-14 VITALS — BP 118/74 | HR 70 | Temp 98.2°F | Ht 61.5 in | Wt 208.2 lb

## 2014-07-14 DIAGNOSIS — R739 Hyperglycemia, unspecified: Secondary | ICD-10-CM

## 2014-07-14 DIAGNOSIS — E785 Hyperlipidemia, unspecified: Secondary | ICD-10-CM

## 2014-07-14 DIAGNOSIS — Z23 Encounter for immunization: Secondary | ICD-10-CM

## 2014-07-14 DIAGNOSIS — I10 Essential (primary) hypertension: Secondary | ICD-10-CM

## 2014-07-14 DIAGNOSIS — N644 Mastodynia: Secondary | ICD-10-CM | POA: Insufficient documentation

## 2014-07-14 DIAGNOSIS — Z0000 Encounter for general adult medical examination without abnormal findings: Secondary | ICD-10-CM | POA: Insufficient documentation

## 2014-07-14 DIAGNOSIS — E039 Hypothyroidism, unspecified: Secondary | ICD-10-CM

## 2014-07-14 DIAGNOSIS — E669 Obesity, unspecified: Secondary | ICD-10-CM

## 2014-07-14 NOTE — Progress Notes (Signed)
Pre visit review using our clinic review tool, if applicable. No additional management support is needed unless otherwise documented below in the visit note. 

## 2014-07-14 NOTE — Patient Instructions (Signed)
Please stop at check out for referral for surgeon to check breast  prevnar vaccine today  Try to get 1200-1500 mg of calcium per day with at least 1000-2000  iu of vitamin D - for bone health Keep working on healthy diet and exercise  Follow up in 6 months with labs prior

## 2014-07-14 NOTE — Progress Notes (Signed)
Subjective:    Patient ID: Andrea Cooper, female    DOB: 1950/11/23, 64 y.o.   MRN: [id number]  HPI Here for annual medicare wellness visit as well as chronic/acute medical problems  As well as preventative visit/exam  I have personally reviewed the Medicare Annual Wellness questionnaire and have noted 1. The patient's medical and social history 2. Their use of alcohol, tobacco or illicit drugs 3. Their current medications and supplements 4. The patient's functional ability including ADL's, fall risks, home safety risks and hearing or visual             impairment. 5. Diet and physical activities 6. Evidence for depression or mood disorders  The patients weight, height, BMI have been recorded in the chart and visual acuity is per eye clinic.  I have made referrals, counseling and provided education to the patient based review of the above and I have provided the pt with a written personalized care plan for preventive services. Reviewed and updated provider list, see scanned forms.  Doing fairly well overall  Still has pain in L breast  Lot of stress caring for mother in dementia    See scanned forms.  Routine anticipatory guidance given to patient.  See health maintenance. Colon cancer screening 8/15 normal colonoscopy HIV screen- not interested  Hep C screen -not interested  Breast cancer screening- mammogram was 6/15 - normal but she continues to have pain in L lateral breast- sore to lie on , still cannot feel a lump  Self breast exam- tenderness but no lump that she can feel and no skin change or rash (side hurts also but not the back)  Flu vaccine 9/15 Tetanus vaccine 4/07  Pneumovax 9/10 , and due for prevnar Zoster vaccine 11/15  dexa 5.02 normal -no fractures , she does not take ca or D  Advance directive - does have POA and living will  Cognitive function addressed- see scanned forms- and if abnormal then additional documentation follows.  She forgets little  things/nothing very concerning   Still sees Dr Toy Care for depression - still struggles with it - was bad in the spring  Inc her prozac to 80 mg then- it has helped some  Has a name to pursue counseling - plans to do that (lot of stressors)  PMH and SH reviewed  Meds, vitals, and allergies reviewed.   ROS: See HPI.  Otherwise negative.     bp is stable today  No cp or palpitations or headaches or edema  No side effects to medicines  BP Readings from Last 3 Encounters:  07/14/14 118/74  12/15/13 122/80  10/07/13 135/90      Hyperglycemia Improved  Lab Results  Component Value Date   HGBA1C 6.2 07/08/2014   was 6.6 prev Is watching sugar in her diet - really working on it  Walking her dog for exercise   Hypothyroidism  Pt has no clinical changes No change in energy level/ hair or skin/ edema and no tremor Lab Results  Component Value Date   TSH 2.11 07/08/2014     Wt is down 1 lb with bmi of 38     Chemistry      Component Value Date/Time   NA 142 07/08/2014 0802   K 4.9 07/08/2014 0802   CL 103 07/08/2014 0802   CO2 32 07/08/2014 0802   BUN 15 07/08/2014 0802   CREATININE 0.77 07/08/2014 0802      Component Value Date/Time   CALCIUM 9.8  07/08/2014 0802   ALKPHOS 51 07/08/2014 0802   AST 18 07/08/2014 0802   ALT 27 07/08/2014 0802   BILITOT 0.5 07/08/2014 0802      Lab Results  Component Value Date   WBC 6.7 07/08/2014   HGB 14.2 07/08/2014   HCT 42.4 07/08/2014   MCV 89.5 07/08/2014   PLT 278.0 07/08/2014    Cholesterol   Lab Results  Component Value Date   CHOL 179 07/08/2014   CHOL 191 09/30/2013   CHOL 148 06/18/2013   Lab Results  Component Value Date   HDL 46.70 07/08/2014   HDL 43.20 09/30/2013   HDL 41.40 06/18/2013   Lab Results  Component Value Date   LDLCALC 108* 07/08/2014   LDLCALC 124* 09/30/2013   LDLCALC 85 06/18/2013   Lab Results  Component Value Date   TRIG 121.0 07/08/2014   TRIG 121.0 09/30/2013   TRIG 109.0  06/18/2013   Lab Results  Component Value Date   CHOLHDL 4 07/08/2014   CHOLHDL 4 09/30/2013   CHOLHDL 4 06/18/2013   Lab Results  Component Value Date   LDLDIRECT 160.7 01/14/2007   LDLDIRECT 202.7 08/01/2006    Stable to slightly improved Eating greens  Taking pravastatin   Patient Active Problem List   Diagnosis Date Noted  . Hypokalemia 12/15/2013  . Other screening mammogram 06/07/2011  . Routine general medical examination at a health care facility 05/31/2011  . SLEEP APNEA 09/13/2009  . OBESITY 01/13/2009  . POSTMENOPAUSAL STATUS 10/01/2008  . BACK PAIN, LUMBAR, WITH RADICULOPATHY 08/10/2008  . SWEATING 07/15/2008  . Hyperglycemia 05/22/2007  . TOBACCO USE, QUIT 05/22/2007  . CEREBRAL ANEURYSM 08/09/2006  . Hypothyroidism 07/25/2006  . Hyperlipidemia 07/25/2006  . ANXIETY 07/25/2006  . DEPRESSION 07/25/2006  . Essential hypertension 07/25/2006  . IBS 07/25/2006  . OVEREATING 07/25/2006  . SKIN CANCER, HX OF 07/25/2006   Past Medical History  Diagnosis Date  . History of anxiety   . History of depression   . History of hyperlipidemia   . History of hypothyroidism   . History of skin cancer     hx of melanoma left lower arm, recurrance  . History of cerebral aneurysm repair     history of cerebral aneurysm   . History of hyperglycemia    Past Surgical History  Procedure Laterality Date  . Appendectomy    . Total abdominal hysterectomy w/ bilateral salpingoophorectomy    . Tonsillectomy    . Cardiolite  12/1997    normal EF 68%  . Eye surgery      RR  . Foot surgery  02/1998    bilateral  . Cerebral aneurysm repair     History  Substance Use Topics  . Smoking status: Former Research scientist (life sciences)  . Smokeless tobacco: Former Systems developer    Quit date: 06/07/2006  . Alcohol Use: 0.0 oz/week    0 Standard drinks or equivalent per week     Comment: occ   Family History  Problem Relation Age of Onset  . Diabetes Father   . Diabetes Mother   . Depression Father     . CAD Father   . Diverticulosis Mother   . Adrenal disorder Mother   . Breast cancer Other     distant relatives (all on mother's side)  . Cerebral aneurysm Sister    Allergies  Allergen Reactions  . Codeine     REACTION: nausea and vomiting   Current Outpatient Prescriptions on File Prior to Visit  Medication  Sig Dispense Refill  . buPROPion (WELLBUTRIN XL) 300 MG 24 hr tablet Take 300 mg by mouth daily.    . clobetasol (TEMOVATE) 0.05 % external solution as needed.    . hydrochlorothiazide (HYDRODIURIL) 25 MG tablet Take 1 tablet (25 mg total) by mouth daily. 90 tablet 3  . levothyroxine (SYNTHROID, LEVOTHROID) 175 MCG tablet TAKE 1 TABLET BY MOUTH EVERY DAY BEFORE BREAKFAST 90 tablet 3  . potassium chloride (K-DUR) 10 MEQ tablet Take 1 tablet (10 mEq total) by mouth daily. 90 tablet 3  . pravastatin (PRAVACHOL) 20 MG tablet Take 1 tablet (20 mg total) by mouth daily. 90 tablet 3  . propranolol ER (INDERAL LA) 80 MG 24 hr capsule Take 1 capsule (80 mg total) by mouth daily. 90 capsule 3   No current facility-administered medications on file prior to visit.       Review of Systems    Review of Systems  Constitutional: Negative for fever, appetite change, fatigue and unexpected weight change.  Eyes: Negative for pain and visual disturbance.  Respiratory: Negative for cough and shortness of breath.   Cardiovascular: Negative for cp or palpitations    Gastrointestinal: Negative for nausea, diarrhea and constipation.  Genitourinary: Negative for urgency and frequency. pos for L lateral breast tenderness w/o lumps  Skin: Negative for pallor or rash   Neurological: Negative for weakness, light-headedness, numbness and headaches.  Hematological: Negative for adenopathy. Does not bruise/bleed easily.  Psychiatric/Behavioral: Negative for dysphoric mood. The patient is not nervous/anxious.      Objective:   Physical Exam  Constitutional: She appears well-developed and  well-nourished. No distress.  obese and well appearing   HENT:  Head: Normocephalic and atraumatic.  Right Ear: External ear normal.  Left Ear: External ear normal.  Mouth/Throat: Oropharynx is clear and moist.  Eyes: Conjunctivae and EOM are normal. Pupils are equal, round, and reactive to light. No scleral icterus.  Neck: Normal range of motion. Neck supple. No JVD present. Carotid bruit is not present. No thyromegaly present.  Cardiovascular: Normal rate, regular rhythm, normal heart sounds and intact distal pulses.  Exam reveals no gallop.   Pulmonary/Chest: Effort normal and breath sounds normal. No respiratory distress. She has no wheezes. She exhibits no tenderness.  Abdominal: Soft. Bowel sounds are normal. She exhibits no distension, no abdominal bruit and no mass. There is no tenderness.  Genitourinary: There is breast tenderness. No breast swelling, discharge or bleeding.  Breast exam: No mass, nodules, thickening, , bulging, retraction, inflamation, nipple discharge or skin changes noted.  No axillary or clavicular LA.     Pt has mild tenderness in L lateral breast without lump or skin change  Musculoskeletal: Normal range of motion. She exhibits no edema or tenderness.  Lymphadenopathy:    She has no cervical adenopathy.  Neurological: She is alert. She has normal reflexes. No cranial nerve deficit. She exhibits normal muscle tone. Coordination normal.  Skin: Skin is warm and dry. No rash noted. No erythema. No pallor.  Psychiatric: She has a normal mood and affect.          Assessment & Plan:   Problem List Items Addressed This Visit    Breast pain, left - Primary    Ongoing  Nl exam and mammogram  Ref to gen surg for further eval       Relevant Orders   Ambulatory referral to General Surgery   Encounter for Medicare annual wellness exam    Reviewed health habits including diet  and exercise and skin cancer prevention Reviewed appropriate screening tests for age    Also reviewed health mt list, fam hx and immunization status , as well as social and family history   See HPI Lab reviewed  Please stop at check out for referral for surgeon to check breast  prevnar vaccine today  Try to get 1200-1500 mg of calcium per day with at least 1000-2000  iu of vitamin D - for bone health Keep working on healthy diet and exercise  Follow up in 6 months with labs prior       Essential hypertension    bp in fair control at this time  BP Readings from Last 1 Encounters:  07/14/14 118/74   No changes needed Disc lifstyle change with low sodium diet and exercise  Lab discussed       Hyperglycemia    Lab Results  Component Value Date   HGBA1C 6.2 07/08/2014   This is improved Enc low glycemic diet and wt loss to prevent DM      Relevant Orders   Hemoglobin A1c   Hyperlipidemia    Disc goals for lipids and reasons to control them Rev labs with pt Rev low sat fat diet in detail Improved On pravastatin       Relevant Orders   Lipid panel   Hypothyroidism    Hypothyroidism  Pt has no clinical changes No change in energy level/ hair or skin/ edema and no tremor Lab Results  Component Value Date   TSH 2.11 07/08/2014          Obesity    Discussed how this problem influences overall health and the risks it imposes  Reviewed plan for weight loss with lower calorie diet (via better food choices and also portion control or program like weight watchers) and exercise building up to or more than 30 minutes 5 days per week including some aerobic activity         Routine general medical examination at a health care facility    Reviewed health habits including diet and exercise and skin cancer prevention Reviewed appropriate screening tests for age  Also reviewed health mt list, fam hx and immunization status , as well as social and family history   See HPI Lab reviewed  Please stop at check out for referral for surgeon to check breast  prevnar  vaccine today  Try to get 1200-1500 mg of calcium per day with at least 1000-2000  iu of vitamin D - for bone health Keep working on healthy diet and exercise  Follow up in 6 months with labs prior        Other Visit Diagnoses    Need for vaccination with 13-polyvalent pneumococcal conjugate vaccine        Relevant Orders    Pneumococcal conjugate vaccine 13-valent (Completed)

## 2014-07-16 NOTE — Assessment & Plan Note (Signed)
Ongoing  Nl exam and mammogram  Ref to gen surg for further eval

## 2014-07-16 NOTE — Assessment & Plan Note (Signed)
Hypothyroidism  Pt has no clinical changes No change in energy level/ hair or skin/ edema and no tremor Lab Results  Component Value Date   TSH 2.11 07/08/2014

## 2014-07-16 NOTE — Assessment & Plan Note (Signed)
bp in fair control at this time  BP Readings from Last 1 Encounters:  07/14/14 118/74   No changes needed Disc lifstyle change with low sodium diet and exercise  Lab discussed

## 2014-07-16 NOTE — Assessment & Plan Note (Signed)
Reviewed health habits including diet and exercise and skin cancer prevention Reviewed appropriate screening tests for age  Also reviewed health mt list, fam hx and immunization status , as well as social and family history   See HPI Lab reviewed  Please stop at check out for referral for surgeon to check breast  prevnar vaccine today  Try to get 1200-1500 mg of calcium per day with at least 1000-2000  iu of vitamin D - for bone health Keep working on healthy diet and exercise  Follow up in 6 months with labs prior

## 2014-07-16 NOTE — Assessment & Plan Note (Signed)
Lab Results  Component Value Date   HGBA1C 6.2 07/08/2014   This is improved Enc low glycemic diet and wt loss to prevent DM

## 2014-07-16 NOTE — Assessment & Plan Note (Signed)
Discussed how this problem influences overall health and the risks it imposes  Reviewed plan for weight loss with lower calorie diet (via better food choices and also portion control or program like weight watchers) and exercise building up to or more than 30 minutes 5 days per week including some aerobic activity    

## 2014-07-16 NOTE — Assessment & Plan Note (Signed)
Disc goals for lipids and reasons to control them Rev labs with pt Rev low sat fat diet in detail Improved On pravastatin

## 2014-07-23 ENCOUNTER — Other Ambulatory Visit: Payer: 59

## 2014-07-23 ENCOUNTER — Encounter: Payer: Self-pay | Admitting: General Surgery

## 2014-07-23 ENCOUNTER — Ambulatory Visit (INDEPENDENT_AMBULATORY_CARE_PROVIDER_SITE_OTHER): Payer: Medicare Other | Admitting: General Surgery

## 2014-07-23 VITALS — BP 142/88 | HR 62 | Resp 14 | Ht 62.0 in | Wt 212.0 lb

## 2014-07-23 DIAGNOSIS — Z1231 Encounter for screening mammogram for malignant neoplasm of breast: Secondary | ICD-10-CM | POA: Diagnosis not present

## 2014-07-23 DIAGNOSIS — N644 Mastodynia: Secondary | ICD-10-CM
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
## 2014-07-23 NOTE — Patient Instructions (Signed)
The patient is aware to call back for any questions or concerns.  

## 2014-07-24 ENCOUNTER — Ambulatory Visit
Admission: RE | Admit: 2014-07-24 | Discharge: 2014-07-24 | Disposition: A | Discharge status: Home / Self Care | Payer: Medicare Other | Source: Ambulatory Visit | Attending: General Surgery | Admitting: General Surgery

## 2014-07-24 ENCOUNTER — Other Ambulatory Visit: Payer: Self-pay | Admitting: General Surgery

## 2014-07-24 DIAGNOSIS — Z1231 Encounter for screening mammogram for malignant neoplasm of breast: Secondary | ICD-10-CM | POA: Insufficient documentation

## 2014-07-25 DIAGNOSIS — N644 Mastodynia: Secondary | ICD-10-CM | POA: Insufficient documentation

## 2014-07-25 DIAGNOSIS — Z1231 Encounter for screening mammogram for malignant neoplasm of breast: Secondary | ICD-10-CM | POA: Insufficient documentation

## 2014-07-29 ENCOUNTER — Encounter: Payer: Self-pay | Admitting: General Surgery

## 2014-07-29 ENCOUNTER — Ambulatory Visit (INDEPENDENT_AMBULATORY_CARE_PROVIDER_SITE_OTHER): Payer: Medicare Other | Admitting: General Surgery

## 2014-07-29 ENCOUNTER — Ambulatory Visit: Payer: 59

## 2014-07-29 VITALS — BP 130/70 | HR 77 | Resp 12 | Ht 62.0 in | Wt 211.0 lb

## 2014-07-29 DIAGNOSIS — N6320 Unspecified lump in the left breast, unspecified quadrant: Secondary | ICD-10-CM

## 2014-07-29 DIAGNOSIS — N6012 Diffuse cystic mastopathy of left breast: Secondary | ICD-10-CM | POA: Diagnosis not present

## 2014-07-29 DIAGNOSIS — N63 Unspecified lump in breast: Secondary | ICD-10-CM | POA: Diagnosis not present

## 2014-07-29 HISTORY — PX: BREAST BIOPSY: SHX20

## 2014-07-29 NOTE — Patient Instructions (Signed)
You may remove the waterproof dressing tomorrow, leave the steri strips in place. Return in one week.

## 2014-07-29 NOTE — Progress Notes (Signed)
Patient ID: Andrea Cooper, female   DOB: Dec 14, 1950, 64 y.o.   MRN: [id number]  Chief Complaint  Patient presents with  . Procedure    breast biopsy    HPI Andrea Cooper is a 64 y.o. female here today for a left breast biopsy. Patient had a mammogram on 07/24/14 that was normal.  HPI  Past Medical History  Diagnosis Date  . History of anxiety   . History of depression   . History of hyperlipidemia   . History of hypothyroidism   . History of skin cancer     hx of melanoma left lower arm, recurrance  . History of cerebral aneurysm repair     history of cerebral aneurysm   . History of hyperglycemia   . Thyroid disease   . IBS (irritable bowel syndrome)     Past Surgical History  Procedure Laterality Date  . Total abdominal hysterectomy w/ bilateral salpingoophorectomy    . Tonsillectomy  1964  . Cardiolite  12/1997    normal EF 68%  . Eye surgery      RR  . Foot surgery  02/1998    bilateral  . Mohs surgery  1992  . Toe surgery Left 2015  . Appendectomy  1966  . Cerebral aneurysm repair  2008, 2009    coiling at [facility] History  Problem Relation Age of Onset  . Diabetes Father   . Diabetes Mother   . Depression Father   . CAD Father   . Diverticulosis Mother   . Adrenal disorder Mother   . Breast cancer Other     distant relatives (all on mother's side)  . Cerebral aneurysm Sister     Social History History  Substance Use Topics  . Smoking status: Former Research scientist (life sciences)  . Smokeless tobacco: Former Systems developer    Quit date: 06/07/2006  . Alcohol Use: 0.0 oz/week    0 Standard drinks or equivalent per week     Comment: occasional wine    Allergies  Allergen Reactions  . Codeine Nausea And Vomiting    REACTION: nausea and vomiting    Current Outpatient Prescriptions  Medication Sig Dispense Refill  . aspirin 325 MG tablet Take 325 mg by mouth daily.    Marland Kitchen buPROPion (WELLBUTRIN XL) 300 MG 24 hr tablet Take 300 mg by mouth daily.    Marland Kitchen FLUoxetine (PROZAC)  40 MG capsule Take 80 mg by mouth daily.    . hydrochlorothiazide (HYDRODIURIL) 25 MG tablet Take 1 tablet (25 mg total) by mouth daily. 90 tablet 3  . levothyroxine (SYNTHROID, LEVOTHROID) 175 MCG tablet TAKE 1 TABLET BY MOUTH EVERY DAY BEFORE BREAKFAST 90 tablet 3  . LORazepam (ATIVAN) 1 MG tablet Take 1 tablet by mouth daily as needed.    Marland Kitchen omeprazole (PRILOSEC) 20 MG capsule Take 20 mg by mouth daily.    . potassium chloride (K-DUR) 10 MEQ tablet Take 1 tablet (10 mEq total) by mouth daily. 90 tablet 3  . pravastatin (PRAVACHOL) 20 MG tablet Take 1 tablet (20 mg total) by mouth daily. 90 tablet 3  . propranolol ER (INDERAL LA) 80 MG 24 hr capsule Take 1 capsule (80 mg total) by mouth daily. 90 capsule 3   No current facility-administered medications for this visit.    Review of Systems Review of Systems  Constitutional: Negative.   Respiratory: Negative.   Cardiovascular: Negative.     Blood pressure 130/70, pulse 77, resp. rate 12, height 5\' 2"  (  1.575 m), weight 211 lb (95.709 kg).  Physical Exam Physical Exam  Data Reviewed Bilateral mammograms dated 03/24/2014 were reviewed. BI-RADS-1.  There is focal area of breast parenchyma in a generally fatty replaced breast in the upper-outer quadrant of the left corresponding to the area of patient's palpable thickening and tenderness.    Assessment    Tender breast mass, likely inflammatory process involving adipose tissue based on her past ultrasound and recent mammograms.    Plan    It was elected to proceed to vacuum assisted biopsy. The procedure was reviewed with the patient. The area of abnormality was again localized in the 3:00 position the left breast approximately 6 cm from the nipple. 10 mL of 0.5% Xylocaine with 0.25% Marcaine with 1-200,000 of epinephrine was utilized well tolerated. Chlor prep was applied to the skin. A 10-gauge Encor device was advanced under ultrasound guidance. 16 core samples obtained. Scant  bleeding noted. Postbiopsy clip placed. Skin defect closed with benzoin and Steri-Strips followed by Telfa and Tegaderm dressing. The procedure was well tolerated.  The patient will be notified when pathology is available. We'll plan on a staff follow-up exam in one week for a wound check.      PCP:  Gerald Dexter, Forest Gleason 07/29/2014, 7:59 PM

## 2014-07-30 ENCOUNTER — Telehealth: Payer: Self-pay | Admitting: *Deleted

## 2014-07-30 NOTE — Telephone Encounter (Signed)
Patient notified as instructed and verbalizes understanding.  

## 2014-07-30 NOTE — Telephone Encounter (Signed)
-----   Message from Robert Bellow, MD sent at 07/30/2014 10:27 AM EDT ----- Notify patient biopsy results were fine.  MD check in one month, staff next week.  Thanks

## 2014-08-06 ENCOUNTER — Ambulatory Visit (INDEPENDENT_AMBULATORY_CARE_PROVIDER_SITE_OTHER): Payer: Medicare Other | Admitting: *Deleted

## 2014-08-06 DIAGNOSIS — N6320 Unspecified lump in the left breast, unspecified quadrant: Secondary | ICD-10-CM

## 2014-08-06 DIAGNOSIS — N63 Unspecified lump in breast: Secondary | ICD-10-CM

## 2014-08-06 NOTE — Patient Instructions (Signed)
As scheduled

## 2014-08-06 NOTE — Progress Notes (Signed)
Patient here today for follow up post left breast biopsy.  Minimal bruising noted.  The patient is aware that a heating pad may be used for comfort as needed.  Aware of pathology. Follow up as scheduled.

## 2014-08-27 ENCOUNTER — Ambulatory Visit (INDEPENDENT_AMBULATORY_CARE_PROVIDER_SITE_OTHER): Payer: Medicare Other | Admitting: General Surgery

## 2014-08-27 ENCOUNTER — Encounter: Payer: Self-pay | Admitting: General Surgery

## 2014-08-27 VITALS — BP 136/82 | HR 70 | Resp 14 | Ht 62.0 in | Wt 212.0 lb

## 2014-08-27 DIAGNOSIS — N6320 Unspecified lump in the left breast, unspecified quadrant: Secondary | ICD-10-CM

## 2014-08-27 DIAGNOSIS — N63 Unspecified lump in breast: Secondary | ICD-10-CM

## 2014-08-27 DIAGNOSIS — N641 Fat necrosis of breast: Secondary | ICD-10-CM

## 2014-08-27 NOTE — Patient Instructions (Signed)
Continue self breast exams. Call office for any new breast issues or concerns. 

## 2014-08-27 NOTE — Progress Notes (Signed)
Patient ID: Andrea Cooper, female   DOB: 1950-08-25, 64 y.o.   MRN: [id number]  Chief Complaint  Patient presents with  . Follow-up    HPI Andrea Cooper is a 64 y.o. female.  Here today for her follow up left breast biopsy on 07-29-14. She states she is doing well, still a little tender in the left breast.   HPI  Past Medical History  Diagnosis Date  . History of anxiety   . History of depression   . History of hyperlipidemia   . History of hypothyroidism   . History of skin cancer     hx of melanoma left lower arm, recurrance  . History of cerebral aneurysm repair     history of cerebral aneurysm   . History of hyperglycemia   . Thyroid disease   . IBS (irritable bowel syndrome)     Past Surgical History  Procedure Laterality Date  . Total abdominal hysterectomy w/ bilateral salpingoophorectomy    . Tonsillectomy  1964  . Cardiolite  12/1997    normal EF 68%  . Eye surgery      RR  . Foot surgery  02/1998    bilateral  . Mohs surgery  1992  . Toe surgery Left 2015  . Appendectomy  1966  . Cerebral aneurysm repair  2008, 2009    coiling at [facility]  . Breast biopsy Left 07-29-14    FIBROCYSTIC CHANGES WITH FEW MICROCALCIFICATIONS.    Family History  Problem Relation Age of Onset  . Diabetes Father   . Diabetes Mother   . Depression Father   . CAD Father   . Diverticulosis Mother   . Adrenal disorder Mother   . Breast cancer Other     distant relatives (all on mother's side)  . Cerebral aneurysm Sister     Social History Social History  Substance Use Topics  . Smoking status: Former Research scientist (life sciences)  . Smokeless tobacco: Former Systems developer    Quit date: 06/07/2006  . Alcohol Use: 0.0 oz/week    0 Standard drinks or equivalent per week     Comment: occasional wine    Allergies  Allergen Reactions  . Codeine Nausea And Vomiting    REACTION: nausea and vomiting    Current Outpatient Prescriptions  Medication Sig Dispense Refill  . aspirin 325 MG tablet Take 325 mg  by mouth daily.    Marland Kitchen buPROPion (WELLBUTRIN XL) 300 MG 24 hr tablet Take 300 mg by mouth daily.    Marland Kitchen FLUoxetine (PROZAC) 40 MG capsule Take 80 mg by mouth daily.    . hydrochlorothiazide (HYDRODIURIL) 25 MG tablet Take 1 tablet (25 mg total) by mouth daily. 90 tablet 3  . levothyroxine (SYNTHROID, LEVOTHROID) 175 MCG tablet TAKE 1 TABLET BY MOUTH EVERY DAY BEFORE BREAKFAST 90 tablet 3  . LORazepam (ATIVAN) 1 MG tablet Take 1 tablet by mouth daily as needed.    Marland Kitchen omeprazole (PRILOSEC) 20 MG capsule Take 20 mg by mouth daily.    . potassium chloride (K-DUR) 10 MEQ tablet Take 1 tablet (10 mEq total) by mouth daily. 90 tablet 3  . pravastatin (PRAVACHOL) 20 MG tablet Take 1 tablet (20 mg total) by mouth daily. 90 tablet 3  . propranolol ER (INDERAL LA) 80 MG 24 hr capsule Take 1 capsule (80 mg total) by mouth daily. 90 capsule 3   No current facility-administered medications for this visit.    Review of Systems Review of Systems  Constitutional: Negative.  Respiratory: Negative.   Cardiovascular: Negative.     Blood pressure 136/82, pulse 70, resp. rate 14, height 5\' 2"  (1.575 m), weight 212 lb (96.163 kg).  Physical Exam Physical Exam  Constitutional: She is oriented to person, place, and time. She appears well-developed and well-nourished.  Pulmonary/Chest:    Left breast incision well healed.  Neurological: She is alert and oriented to person, place, and time.  Skin: Skin is warm and dry.  Psychiatric: Her behavior is normal.    Data Reviewed Breast, left, needle core biopsy, 3 o'clock - FIBROCYSTIC CHANGES WITH FEW MICROCALCIFICATIONS. - FAT NECROSIS, FOCAL. - THERE IS NO EVIDENCE OF MALIGNANCY. Enid Cutter MD Pathologist, Electronic Signature (Case signed 07/30/2014)   Assessment    Doing well status post excision left breast area of fat necrosis.    Plan        Follow up as needed. Annual screening mammograms with her primary care provider. She is welcome  return any time should she experience persistent discomfort or experience any new breast problems. Continue self breast exams. Call office for any new breast issues or concerns. Heating pad as needed for comfort.   PCP:  Loura Pardon  Stephens W 08/27/2014, 9:21 PM    Specimen Gross

## 2014-10-23 DIAGNOSIS — L910 Hypertrophic scar: Secondary | ICD-10-CM | POA: Diagnosis not present

## 2014-10-23 DIAGNOSIS — L821 Other seborrheic keratosis: Secondary | ICD-10-CM | POA: Diagnosis not present

## 2014-10-23 DIAGNOSIS — Z85820 Personal history of malignant melanoma of skin: Secondary | ICD-10-CM | POA: Diagnosis not present

## 2014-11-12 DIAGNOSIS — Z23 Encounter for immunization: Secondary | ICD-10-CM | POA: Diagnosis not present

## 2014-12-17 ENCOUNTER — Other Ambulatory Visit: Payer: Self-pay | Admitting: Family Medicine

## 2015-01-15 ENCOUNTER — Other Ambulatory Visit: Payer: 59

## 2015-01-22 ENCOUNTER — Ambulatory Visit: Payer: 59 | Admitting: Family Medicine

## 2015-01-27 DIAGNOSIS — D225 Melanocytic nevi of trunk: Secondary | ICD-10-CM | POA: Diagnosis not present

## 2015-01-27 DIAGNOSIS — L814 Other melanin hyperpigmentation: Secondary | ICD-10-CM | POA: Diagnosis not present

## 2015-01-27 DIAGNOSIS — L905 Scar conditions and fibrosis of skin: Secondary | ICD-10-CM | POA: Diagnosis not present

## 2015-01-27 DIAGNOSIS — L910 Hypertrophic scar: Secondary | ICD-10-CM | POA: Diagnosis not present

## 2015-01-27 DIAGNOSIS — D2262 Melanocytic nevi of left upper limb, including shoulder: Secondary | ICD-10-CM | POA: Diagnosis not present

## 2015-01-27 DIAGNOSIS — D1801 Hemangioma of skin and subcutaneous tissue: Secondary | ICD-10-CM | POA: Diagnosis not present

## 2015-01-27 DIAGNOSIS — Z85820 Personal history of malignant melanoma of skin: Secondary | ICD-10-CM | POA: Diagnosis not present

## 2015-01-27 DIAGNOSIS — L821 Other seborrheic keratosis: Secondary | ICD-10-CM | POA: Diagnosis not present

## 2015-01-27 DIAGNOSIS — D2261 Melanocytic nevi of right upper limb, including shoulder: Secondary | ICD-10-CM | POA: Diagnosis not present

## 2015-01-31 LAB — HM DIABETES EYE EXAM

## 2015-02-10 DIAGNOSIS — H2513 Age-related nuclear cataract, bilateral: Secondary | ICD-10-CM | POA: Diagnosis not present

## 2015-02-10 DIAGNOSIS — Z9889 Other specified postprocedural states: Secondary | ICD-10-CM | POA: Diagnosis not present

## 2015-04-10 ENCOUNTER — Other Ambulatory Visit: Payer: Self-pay | Admitting: Family Medicine

## 2015-05-05 ENCOUNTER — Other Ambulatory Visit (INDEPENDENT_AMBULATORY_CARE_PROVIDER_SITE_OTHER): Payer: Medicare Other

## 2015-05-05 DIAGNOSIS — E785 Hyperlipidemia, unspecified: Secondary | ICD-10-CM

## 2015-05-05 DIAGNOSIS — R739 Hyperglycemia, unspecified: Secondary | ICD-10-CM

## 2015-05-05 LAB — HEMOGLOBIN A1C: Hgb A1c MFr Bld: 7.5 % — ABNORMAL HIGH (ref 4.6–6.5)

## 2015-05-05 LAB — LIPID PANEL
CHOL/HDL RATIO: 5
Cholesterol: 198 mg/dL (ref 0–200)
HDL: 43.5 mg/dL (ref >39.00)
LDL Cholesterol: 133 mg/dL — ABNORMAL HIGH (ref 0–99)
NonHDL: 154.23
Triglycerides: 106 mg/dL (ref 0.0–149.0)
VLDL: 21.2 mg/dL (ref 0.0–40.0)

## 2015-05-12 ENCOUNTER — Encounter: Payer: Self-pay | Admitting: Family Medicine

## 2015-05-12 ENCOUNTER — Ambulatory Visit (INDEPENDENT_AMBULATORY_CARE_PROVIDER_SITE_OTHER): Payer: Medicare Other | Admitting: Family Medicine

## 2015-05-12 VITALS — BP 112/72 | HR 69 | Temp 97.8°F | Ht 62.0 in | Wt 212.8 lb

## 2015-05-12 DIAGNOSIS — E669 Obesity, unspecified: Secondary | ICD-10-CM

## 2015-05-12 DIAGNOSIS — I10 Essential (primary) hypertension: Secondary | ICD-10-CM

## 2015-05-12 DIAGNOSIS — E119 Type 2 diabetes mellitus without complications: Secondary | ICD-10-CM | POA: Diagnosis not present

## 2015-05-12 DIAGNOSIS — E785 Hyperlipidemia, unspecified: Secondary | ICD-10-CM

## 2015-05-12 MED ORDER — METFORMIN HCL 500 MG PO TABS: 500.0000 mg | ORAL_TABLET | Freq: Two times a day (BID) | ORAL | Status: DC

## 2015-05-12 NOTE — Progress Notes (Signed)
Subjective:    Patient ID: Andrea Cooper, female    DOB: [birth year], 65 y.o.   MRN: [id number]  HPI Here for f/u of chronic health problems   Feeling fair overall -has been tired lately  Not taking care of herself   Wt is stable with bmi of 38 (obese)  bp is stable today  No cp or palpitations or headaches or edema  No side effects to medicines  BP Readings from Last 3 Encounters:  05/12/15 112/72  08/27/14 136/82  07/29/14 130/70      Diabetes (glucose is up-now in DM range-formerly hyperglycemia)  DM diet - was doing poorly, she "gave up" - ate everything and anything she wanted, she was eating a fair amt of sweets  Boredom eating for the most part  Since she got test results-eating much better and drinking more water  Exercise -none at all (the weather dismotivated her) Now [facility] is considering the [facility] now and she also joined a senior center Symptoms- she has more urination / no excess thirst No numbness  A1C last  Lab Results  Component Value Date   HGBA1C 7.5* 05/05/2015  this is up from 6.2 Is open to metformin  Is interested in DM teaching - [employer] would be the easiest for her Diet controlled Renal protection Last eye exam - last one was in Jan at [facility]   Hyperlipidemia Lab Results  Component Value Date   CHOL 198 05/05/2015   CHOL 179 07/08/2014   CHOL 191 09/30/2013   Lab Results  Component Value Date   HDL 43.50 05/05/2015   HDL 46.70 07/08/2014   HDL 43.20 09/30/2013   Lab Results  Component Value Date   LDLCALC 133* 05/05/2015   LDLCALC 108* 07/08/2014   LDLCALC 124* 09/30/2013   Lab Results  Component Value Date   TRIG 106.0 05/05/2015   TRIG 121.0 07/08/2014   TRIG 121.0 09/30/2013   Lab Results  Component Value Date   CHOLHDL 5 05/05/2015   CHOLHDL 4 07/08/2014   CHOLHDL 4 09/30/2013   Lab Results  Component Value Date   LDLDIRECT 160.7 01/14/2007   LDLDIRECT 202.7 08/01/2006   on pravastatin and diet - tolerates it  Eating  poorly     Patient Active Problem List   Diagnosis Date Noted  . Fat necrosis (segmental) of breast 08/27/2014  . Left breast mass 07/29/2014  . Breast pain 07/25/2014  . Visit for screening mammogram 07/25/2014  . Breast pain, left 07/14/2014  . Encounter for Medicare annual wellness exam 07/14/2014  . Hypokalemia 12/15/2013  . Other screening mammogram 06/07/2011  . Routine general medical examination at a health care facility 05/31/2011  . SLEEP APNEA 09/13/2009  . Obesity 01/13/2009  . POSTMENOPAUSAL STATUS 10/01/2008  . BACK PAIN, LUMBAR, WITH RADICULOPATHY 08/10/2008  . SWEATING 07/15/2008  . Diabetes type 2, controlled ([place]) 05/22/2007  . TOBACCO USE, QUIT 05/22/2007  . CEREBRAL ANEURYSM 08/09/2006  . Hypothyroidism 07/25/2006  . Hyperlipidemia 07/25/2006  . ANXIETY 07/25/2006  . DEPRESSION 07/25/2006  . Essential hypertension 07/25/2006  . IBS 07/25/2006  . OVEREATING 07/25/2006  . SKIN CANCER, HX OF 07/25/2006   Past Medical History  Diagnosis Date  . History of anxiety   . History of depression   . History of hyperlipidemia   . History of hypothyroidism   . History of skin cancer     hx of melanoma left lower arm, recurrance  . History of cerebral aneurysm repair  history of cerebral aneurysm   . History of hyperglycemia   . Thyroid disease   . IBS (irritable bowel syndrome)    Past Surgical History  Procedure Laterality Date  . Total abdominal hysterectomy w/ bilateral salpingoophorectomy    . Tonsillectomy  1964  . Cardiolite  12/1997    normal EF 68%  . Eye surgery      RR  . Foot surgery  02/1998    bilateral  . Mohs surgery  1992  . Toe surgery Left 2015  . Appendectomy  1966  . Cerebral aneurysm repair  2008, 2009    coiling at [facility]  . Breast biopsy Left 07-29-14    FIBROCYSTIC CHANGES WITH FEW MICROCALCIFICATIONS.   Social History  Substance Use Topics  . Smoking status: Former Research scientist (life sciences)  . Smokeless tobacco: Former Systems developer    Quit date:  06/07/2006  . Alcohol Use: 0.0 oz/week    0 Standard drinks or equivalent per week     Comment: occasional wine   Family History  Problem Relation Age of Onset  . Diabetes Father   . Diabetes Mother   . Depression Father   . CAD Father   . Diverticulosis Mother   . Adrenal disorder Mother   . Breast cancer Other     distant relatives (all on mother's side)  . Cerebral aneurysm Sister    Allergies  Allergen Reactions  . Codeine Nausea And Vomiting    REACTION: nausea and vomiting   Current Outpatient Prescriptions on File Prior to Visit  Medication Sig Dispense Refill  . aspirin 325 MG tablet Take 325 mg by mouth daily.    Marland Kitchen buPROPion (WELLBUTRIN XL) 300 MG 24 hr tablet Take 300 mg by mouth daily.    Marland Kitchen FLUoxetine (PROZAC) 40 MG capsule Take 80 mg by mouth daily.    . hydrochlorothiazide (HYDRODIURIL) 25 MG tablet TAKE ONE TABLET BY MOUTH ONCE DAILY 90 tablet 1  . levothyroxine (SYNTHROID, LEVOTHROID) 175 MCG tablet TAKE ONE TABLET BY MOUTH EVERY DAY BEFORE BREAKFAST 90 tablet 1  . omeprazole (PRILOSEC) 20 MG capsule Take 20 mg by mouth daily.    . potassium chloride (K-DUR,KLOR-CON) 10 MEQ tablet TAKE ONE TABLET BY MOUTH ONCE DAILY 90 tablet 1  . pravastatin (PRAVACHOL) 20 MG tablet TAKE ONE TABLET BY MOUTH ONCE DAILY 90 tablet 1  . propranolol ER (INDERAL LA) 80 MG 24 hr capsule TAKE ONE CAPSULE BY MOUTH ONCE DAILY 90 capsule 1  . LORazepam (ATIVAN) 1 MG tablet Take 1 tablet by mouth daily as needed. Reported on 05/12/2015     No current facility-administered medications on file prior to visit.     Review of Systems Review of Systems  Constitutional: Negative for fever, appetite change, fatigue and unexpected weight change.  Eyes: Negative for pain and visual disturbance.  Respiratory: Negative for cough and shortness of breath.   Cardiovascular: Negative for cp or palpitations    Gastrointestinal: Negative for nausea, diarrhea and constipation.  Genitourinary: Negative  for urgency and frequency.  Skin: Negative for pallor or rash   Neurological: Negative for weakness, light-headedness, numbness and headaches.  Hematological: Negative for adenopathy. Does not bruise/bleed easily.  Psychiatric/Behavioral: Negative for dysphoric mood. The patient is not nervous/anxious.         Objective:   Physical Exam  Constitutional: She appears well-developed and well-nourished. No distress.  obese and well appearing   HENT:  Head: Normocephalic and atraumatic.  Mouth/Throat: Oropharynx is clear and moist.  Eyes: Conjunctivae and EOM are normal. Pupils are equal, round, and reactive to light.  Neck: Normal range of motion. Neck supple. No JVD present. Carotid bruit is not present. No thyromegaly present.  Cardiovascular: Normal rate, regular rhythm, normal heart sounds and intact distal pulses.  Exam reveals no gallop.   Pulmonary/Chest: Effort normal and breath sounds normal. No respiratory distress. She has no wheezes. She has no rales.  No crackles  Abdominal: Soft. Bowel sounds are normal. She exhibits no distension, no abdominal bruit and no mass. There is no tenderness.  Musculoskeletal: She exhibits no edema.  Lymphadenopathy:    She has no cervical adenopathy.  Neurological: She is alert. She has normal reflexes. No cranial nerve deficit. Coordination normal.  Skin: Skin is warm and dry. No rash noted.  Psychiatric: She has a normal mood and affect.          Assessment & Plan:   Problem List Items Addressed This Visit      Cardiovascular and Mediastinum   Essential hypertension - Primary    bp in fair control at this time  BP Readings from Last 1 Encounters:  05/12/15 112/72   No changes needed Disc lifstyle change with low sodium diet and exercise  Labs reviewed        Endocrine   Diabetes type 2, controlled ([place])    Lab Results  Component Value Date   HGBA1C 7.5* 05/05/2015   This is up significantly  Will ref to DM ed and start  metformin Start watching diet closely for sugar and carbohydrates  Work up to 30 minutes of exercise 5 days per week  Start metformin - update me if any problems or side effects Stop at check out for referral to diabetic teaching  F/u 3 mo with lab prior      Relevant Medications   metFORMIN (GLUCOPHAGE) 500 MG tablet   Other Relevant Orders   Ambulatory referral to diabetic education     Other   Hyperlipidemia    Goal for LDL now under 100 with new DM2 Disc goals for lipids and reasons to control them Rev labs with pt Rev low sat fat diet in detail If not imp at f/u will disc tx as tolerated (tolerates pravastatin currently)      Obesity    Discussed how this problem influences overall health and the risks it imposes  Reviewed plan for weight loss with lower calorie diet (via better food choices and also portion control or program like weight watchers) and exercise building up to or more than 30 minutes 5 days per week including some aerobic activity         Relevant Medications   metFORMIN (GLUCOPHAGE) 500 MG tablet

## 2015-05-12 NOTE — Patient Instructions (Signed)
Start watching diet closely for sugar and carbohydrates  Work up to 30 minutes of exercise 5 days per week  Start metformin - update me if any problems or side effects Stop at check out for referral to diabetic teaching  Also watch diet for cholesterol (Avoid red meat/ fried foods/ egg yolks/ fatty breakfast meats/ butter, cheese and high fat dairy/ and shellfish)  Follow up in 3 months for visit with labs prior

## 2015-05-12 NOTE — Progress Notes (Signed)
Pre visit review using our clinic review tool, if applicable. No additional management support is needed unless otherwise documented below in the visit note. 

## 2015-05-13 NOTE — Assessment & Plan Note (Signed)
bp in fair control at this time  BP Readings from Last 1 Encounters:  05/12/15 112/72   No changes needed Disc lifstyle change with low sodium diet and exercise  Labs reviewed

## 2015-05-13 NOTE — Assessment & Plan Note (Signed)
Discussed how this problem influences overall health and the risks it imposes  Reviewed plan for weight loss with lower calorie diet (via better food choices and also portion control or program like weight watchers) and exercise building up to or more than 30 minutes 5 days per week including some aerobic activity    

## 2015-05-13 NOTE — Assessment & Plan Note (Signed)
Lab Results  Component Value Date   HGBA1C 7.5* 05/05/2015   This is up significantly  Will ref to DM ed and start metformin Start watching diet closely for sugar and carbohydrates  Work up to 30 minutes of exercise 5 days per week  Start metformin - update me if any problems or side effects Stop at check out for referral to diabetic teaching  F/u 3 mo with lab prior

## 2015-05-13 NOTE — Assessment & Plan Note (Signed)
Goal for LDL now under 100 with new DM2 Disc goals for lipids and reasons to control them Rev labs with pt Rev low sat fat diet in detail If not imp at f/u will disc tx as tolerated (tolerates pravastatin currently)

## 2015-05-24 ENCOUNTER — Encounter: Payer: Self-pay | Admitting: Dietician

## 2015-05-24 ENCOUNTER — Encounter: Payer: Medicare Other | Attending: Family Medicine | Admitting: Dietician

## 2015-05-24 VITALS — BP 140/96 | Ht 62.0 in | Wt 210.3 lb

## 2015-05-24 DIAGNOSIS — E119 Type 2 diabetes mellitus without complications: Secondary | ICD-10-CM | POA: Insufficient documentation

## 2015-05-24 NOTE — Patient Instructions (Signed)
  Check blood sugars 2 x day before breakfast and 2 hrs after supper every day  Exercise:  Walk 15-20 min. 3x/wk.    Avoid sugar sweetened drinks (soda, tea, coffee, sports drinks, juices) Limit intake of fried foods and sweets  Eat 3 meals day,   1 snacks a day (in afternoon, if eating late supper; at bedtime, if eating earlier supper)  Space meals 4-6 hours apart  Bring blood sugar records to the next appointment/class  Call your doctor for a prescription for:  1. Meter strips (type) Ultra One Touch test strips  checking  2 times per day  2. Lancets (type)  One Touch Delica lancets  checking  2  times per day  Get a Sharps container  Return for appointment/classes on: 06-10-15

## 2015-05-24 NOTE — Progress Notes (Signed)
Diabetes Self-Management Education  Visit Type: First/Initial  Appt. Start Time: 1330 Appt. End Time: 1430  05/24/2015  [name], identified by name and date of birth, is a 65 y.o. female with a diagnosis of Diabetes: Type 2.   ASSESSMENT  Blood pressure 140/96, height 5\' 2"  (1.575 m), weight 210 lb 4.8 oz (95.391 kg). Body mass index is 38.45 kg/(m^2).      Diabetes Self-Management Education - 05/24/15 1552    Visit Information   Visit Type First/Initial   Initial Visit   Diabetes Type Type 2   Health Coping   How would you rate your overall health? Good   Psychosocial Assessment   Patient Belief/Attitude about Diabetes Motivated to manage diabetes   Self-care barriers None   Patient Concerns Glycemic Control;Weight Control  prevent complications   Preferred Learning Style Visual   Learning Readiness Ready   What is the last grade level you completed in school? 12+   Pre-Education Assessment   Patient understands the diabetes disease and treatment process. Needs Instruction   Patient understands incorporating nutritional management into lifestyle. Needs Instruction   Patient undertands incorporating physical activity into lifestyle. Needs Instruction   Patient understands using medications safely. Needs Instruction   Patient understands monitoring blood glucose, interpreting and using results Needs Instruction   Patient understands prevention, detection, and treatment of acute complications. Needs Instruction   Patient understands prevention, detection, and treatment of chronic complications. Needs Instruction   Patient understands how to develop strategies to address psychosocial issues. Needs Instruction   Patient understands how to develop strategies to promote health/change behavior. Needs Instruction   Complications   Last HgB A1C per patient/outside source 7.5 %  04-2015   How often do you check your blood sugar? 0 times/day (not testing)   Have you had a  dilated eye exam in the past 12 months? Yes  01-2015   Have you had a dental exam in the past 12 months? Yes  04-2015   Are you checking your feet? No   Dietary Intake   Breakfast --  eats 3 meals/day-limits intake of sweets, fried foods and snack foods   Snack (evening) --  eats occasional snack at 8p (crackers and salsa)   Exercise   Exercise Type ADL's  no regular exercise   Patient Education   Previous Diabetes Education No   Disease state  --  discussed pathophysiology of type 2 diabetes and treatment options   Nutrition management  Role of diet in the treatment of diabetes and the relationship between the three main macronutrients and blood glucose level;Food label reading, portion sizes and measuring food.;Carbohydrate counting   Physical activity and exercise  Role of exercise on diabetes management, blood pressure control and cardiac health.  discussed exercise options   Medications Reviewed patients medication for diabetes, action, purpose, timing of dose and side effects.   Monitoring Taught/evaluated SMBG meter.;Purpose and frequency of SMBG.;Taught/discussed recording of test results and interpretation of SMBG.;Identified appropriate SMBG and/or A1C goals.;Yearly dilated eye exam  gave pt Ultra One Touch 2 meter aned instructed on its use-BG 85  (2 hr pp)   Chronic complications Relationship between chronic complications and blood glucose control;Dental care;Retinopathy and reason for yearly dilated eye exams   Personal strategies to promote health Lifestyle issues that need to be addressed for better diabetes care;Helped patient develop diabetes management plan for (enter comment)   Post-Education Assessment   Patient understands the diabetes disease and treatment process. Needs Instruction  Patient understands incorporating nutritional management into lifestyle. Needs Instruction   Patient undertands incorporating physical activity into lifestyle. Needs Instruction    Patient understands using medications safely. Needs Instruction   Patient understands monitoring blood glucose, interpreting and using results Needs Instruction   Patient understands prevention, detection, and treatment of acute complications. Needs Instruction   Patient understands prevention, detection, and treatment of chronic complications. Needs Instruction   Patient understands how to develop strategies to address psychosocial issues. Needs Instruction   Patient understands how to develop strategies to promote health/change behavior. Needs Instruction      Individualized Plan for Diabetes Self-Management Training:   Learning Objective:  Patient will have a greater understanding of diabetes self-management. Patient education plan is to attend individual and/or group sessions per assessed needs and concerns.   Plan:   Patient Instructions   Check blood sugars 2 x day before breakfast and 2 hrs after supper every day  Exercise:  Walk 15-20 min. 3x/wk.    Avoid sugar sweetened drinks (soda, tea, coffee, sports drinks, juices) Limit intake of fried foods and sweets  Eat 3 meals day,   1 snacks a day (in afternoon, if eating late supper; at bedtime, if eating earlier supper)  Space meals 4-6 hours apart  Bring blood sugar records to the next appointment/class  Call your doctor for a prescription for:  1. Meter strips (type) Ultra One Touch test strips  checking  2 times per day  2. Lancets (type)  One Touch Delica lancets  checking  2  times per day  Get a Sharps container  Return for appointment/classes on: 06-10-15   Expected Outcomes:   positive  Education material provided: [employer] Guidelines, Ultra One touch 2 meter  If problems or questions, patient to contact team via:  [phone]  Future DSME appointment:  06-10-15

## 2015-05-25 ENCOUNTER — Telehealth: Payer: Self-pay

## 2015-05-25 MED ORDER — GLUCOSE BLOOD VI STRP: ORAL_STRIP | Status: DC

## 2015-05-25 MED ORDER — ONETOUCH DELICA LANCETS 33G MISC: Status: DC

## 2015-05-25 NOTE — Telephone Encounter (Signed)
Pt has been for diabetic teaching and request one touch ultra test strips and delica lancets to [facility] mail [facility]. Done. Pt voiced understanding.

## 2015-05-28 NOTE — Telephone Encounter (Signed)
Pt called back to make a correction on what she told you yesterday  The 2 rx faxed to [facility] needs to be sent to [facility] on [address] in [place] mail order will not mail these

## 2015-05-31 MED ORDER — ONETOUCH DELICA LANCETS 33G MISC: Status: DC

## 2015-05-31 MED ORDER — GLUCOSE BLOOD VI STRP: ORAL_STRIP | Status: DC

## 2015-05-31 NOTE — Telephone Encounter (Addendum)
Pt called back requesting diabetic supplies to [facility] [address].advised pt done as requested.

## 2015-05-31 NOTE — Addendum Note (Signed)
Addended by: Helene Shoe on: 05/31/2015 09:51 AM   Modules accepted: Orders

## 2015-06-02 ENCOUNTER — Telehealth: Payer: Self-pay | Admitting: *Deleted

## 2015-06-02 MED ORDER — METFORMIN HCL 500 MG PO TABS: 500.0000 mg | ORAL_TABLET | Freq: Two times a day (BID) | ORAL | Status: DC

## 2015-06-02 NOTE — Telephone Encounter (Signed)
Pt left message at Triage she is doing good on the metformin and requested a Rx sent to her mail order. Done and pt notified

## 2015-06-08 DIAGNOSIS — I671 Cerebral aneurysm, nonruptured: Secondary | ICD-10-CM | POA: Diagnosis not present

## 2015-06-10 ENCOUNTER — Encounter: Payer: Medicare Other | Admitting: *Deleted

## 2015-06-10 ENCOUNTER — Encounter: Payer: Self-pay | Admitting: *Deleted

## 2015-06-10 VITALS — Wt 204.0 lb

## 2015-06-10 DIAGNOSIS — E119 Type 2 diabetes mellitus without complications: Secondary | ICD-10-CM | POA: Diagnosis not present

## 2015-06-10 NOTE — Progress Notes (Signed)

## 2015-06-17 ENCOUNTER — Encounter: Payer: Self-pay | Admitting: *Deleted

## 2015-06-17 ENCOUNTER — Encounter: Payer: Medicare Other | Attending: Family Medicine | Admitting: *Deleted

## 2015-06-17 VITALS — Wt 203.3 lb

## 2015-06-17 DIAGNOSIS — E119 Type 2 diabetes mellitus without complications: Secondary | ICD-10-CM | POA: Diagnosis not present

## 2015-06-17 NOTE — Progress Notes (Signed)

## 2015-06-23 DIAGNOSIS — M791 Myalgia: Secondary | ICD-10-CM | POA: Diagnosis not present

## 2015-06-23 DIAGNOSIS — M545 Low back pain: Secondary | ICD-10-CM | POA: Diagnosis not present

## 2015-06-23 DIAGNOSIS — M9903 Segmental and somatic dysfunction of lumbar region: Secondary | ICD-10-CM | POA: Diagnosis not present

## 2015-06-23 DIAGNOSIS — M62830 Muscle spasm of back: Secondary | ICD-10-CM | POA: Diagnosis not present

## 2015-06-23 DIAGNOSIS — M9904 Segmental and somatic dysfunction of sacral region: Secondary | ICD-10-CM | POA: Diagnosis not present

## 2015-06-24 DIAGNOSIS — M9904 Segmental and somatic dysfunction of sacral region: Secondary | ICD-10-CM | POA: Diagnosis not present

## 2015-06-24 DIAGNOSIS — M9903 Segmental and somatic dysfunction of lumbar region: Secondary | ICD-10-CM | POA: Diagnosis not present

## 2015-06-24 DIAGNOSIS — Z9889 Other specified postprocedural states: Secondary | ICD-10-CM | POA: Diagnosis not present

## 2015-06-24 DIAGNOSIS — Z8679 Personal history of other diseases of the circulatory system: Secondary | ICD-10-CM | POA: Diagnosis not present

## 2015-06-24 DIAGNOSIS — I671 Cerebral aneurysm, nonruptured: Secondary | ICD-10-CM | POA: Diagnosis not present

## 2015-06-24 DIAGNOSIS — M545 Low back pain: Secondary | ICD-10-CM | POA: Diagnosis not present

## 2015-06-24 DIAGNOSIS — M62830 Muscle spasm of back: Secondary | ICD-10-CM | POA: Diagnosis not present

## 2015-06-24 DIAGNOSIS — M791 Myalgia: Secondary | ICD-10-CM | POA: Diagnosis not present

## 2015-06-25 DIAGNOSIS — M62830 Muscle spasm of back: Secondary | ICD-10-CM | POA: Diagnosis not present

## 2015-06-25 DIAGNOSIS — M791 Myalgia: Secondary | ICD-10-CM | POA: Diagnosis not present

## 2015-06-25 DIAGNOSIS — M545 Low back pain: Secondary | ICD-10-CM | POA: Diagnosis not present

## 2015-06-25 DIAGNOSIS — M9903 Segmental and somatic dysfunction of lumbar region: Secondary | ICD-10-CM | POA: Diagnosis not present

## 2015-06-25 DIAGNOSIS — M9904 Segmental and somatic dysfunction of sacral region: Secondary | ICD-10-CM | POA: Diagnosis not present

## 2015-06-29 DIAGNOSIS — M545 Low back pain: Secondary | ICD-10-CM | POA: Diagnosis not present

## 2015-06-29 DIAGNOSIS — M9904 Segmental and somatic dysfunction of sacral region: Secondary | ICD-10-CM | POA: Diagnosis not present

## 2015-06-29 DIAGNOSIS — M791 Myalgia: Secondary | ICD-10-CM | POA: Diagnosis not present

## 2015-06-29 DIAGNOSIS — M9903 Segmental and somatic dysfunction of lumbar region: Secondary | ICD-10-CM | POA: Diagnosis not present

## 2015-06-29 DIAGNOSIS — M62830 Muscle spasm of back: Secondary | ICD-10-CM | POA: Diagnosis not present

## 2015-07-01 DIAGNOSIS — M9903 Segmental and somatic dysfunction of lumbar region: Secondary | ICD-10-CM | POA: Diagnosis not present

## 2015-07-01 DIAGNOSIS — M545 Low back pain: Secondary | ICD-10-CM | POA: Diagnosis not present

## 2015-07-01 DIAGNOSIS — M62830 Muscle spasm of back: Secondary | ICD-10-CM | POA: Diagnosis not present

## 2015-07-01 DIAGNOSIS — M791 Myalgia: Secondary | ICD-10-CM | POA: Diagnosis not present

## 2015-07-01 DIAGNOSIS — M9904 Segmental and somatic dysfunction of sacral region: Secondary | ICD-10-CM | POA: Diagnosis not present

## 2015-07-02 DIAGNOSIS — I671 Cerebral aneurysm, nonruptured: Secondary | ICD-10-CM | POA: Diagnosis not present

## 2015-07-02 DIAGNOSIS — E6609 Other obesity due to excess calories: Secondary | ICD-10-CM | POA: Diagnosis not present

## 2015-07-02 DIAGNOSIS — Z9889 Other specified postprocedural states: Secondary | ICD-10-CM | POA: Diagnosis not present

## 2015-07-02 DIAGNOSIS — Z8679 Personal history of other diseases of the circulatory system: Secondary | ICD-10-CM | POA: Diagnosis not present

## 2015-07-02 DIAGNOSIS — E119 Type 2 diabetes mellitus without complications: Secondary | ICD-10-CM | POA: Diagnosis not present

## 2015-07-02 DIAGNOSIS — E039 Hypothyroidism, unspecified: Secondary | ICD-10-CM | POA: Diagnosis not present

## 2015-07-02 DIAGNOSIS — I10 Essential (primary) hypertension: Secondary | ICD-10-CM | POA: Diagnosis not present

## 2015-07-02 DIAGNOSIS — F411 Generalized anxiety disorder: Secondary | ICD-10-CM | POA: Diagnosis not present

## 2015-07-02 DIAGNOSIS — G4730 Sleep apnea, unspecified: Secondary | ICD-10-CM | POA: Diagnosis not present

## 2015-07-05 DIAGNOSIS — Z8679 Personal history of other diseases of the circulatory system: Secondary | ICD-10-CM | POA: Diagnosis not present

## 2015-07-05 DIAGNOSIS — Z9889 Other specified postprocedural states: Secondary | ICD-10-CM | POA: Diagnosis not present

## 2015-07-05 DIAGNOSIS — I671 Cerebral aneurysm, nonruptured: Secondary | ICD-10-CM | POA: Diagnosis not present

## 2015-07-05 DIAGNOSIS — Z48812 Encounter for surgical aftercare following surgery on the circulatory system: Secondary | ICD-10-CM | POA: Diagnosis not present

## 2015-07-15 ENCOUNTER — Encounter: Payer: Self-pay | Admitting: Dietician

## 2015-07-15 ENCOUNTER — Encounter: Payer: Medicare Other | Admitting: Dietician

## 2015-07-15 VITALS — BP 136/96 | Ht 62.0 in | Wt 197.8 lb

## 2015-07-15 DIAGNOSIS — E119 Type 2 diabetes mellitus without complications: Secondary | ICD-10-CM

## 2015-07-15 DIAGNOSIS — I671 Cerebral aneurysm, nonruptured: Secondary | ICD-10-CM | POA: Diagnosis not present

## 2015-07-15 DIAGNOSIS — Z79899 Other long term (current) drug therapy: Secondary | ICD-10-CM | POA: Diagnosis not present

## 2015-07-15 DIAGNOSIS — Z5181 Encounter for therapeutic drug level monitoring: Secondary | ICD-10-CM | POA: Diagnosis not present

## 2015-07-15 NOTE — Progress Notes (Signed)

## 2015-07-19 ENCOUNTER — Encounter: Payer: Self-pay | Admitting: Dietician

## 2015-07-26 DIAGNOSIS — Z7982 Long term (current) use of aspirin: Secondary | ICD-10-CM | POA: Diagnosis not present

## 2015-07-26 DIAGNOSIS — F411 Generalized anxiety disorder: Secondary | ICD-10-CM | POA: Diagnosis present

## 2015-07-26 DIAGNOSIS — Z6834 Body mass index (BMI) 34.0-34.9, adult: Secondary | ICD-10-CM | POA: Diagnosis not present

## 2015-07-26 DIAGNOSIS — E119 Type 2 diabetes mellitus without complications: Secondary | ICD-10-CM | POA: Diagnosis present

## 2015-07-26 DIAGNOSIS — E039 Hypothyroidism, unspecified: Secondary | ICD-10-CM | POA: Diagnosis present

## 2015-07-26 DIAGNOSIS — I671 Cerebral aneurysm, nonruptured: Secondary | ICD-10-CM | POA: Diagnosis not present

## 2015-07-26 DIAGNOSIS — E7800 Pure hypercholesterolemia, unspecified: Secondary | ICD-10-CM | POA: Diagnosis present

## 2015-07-26 DIAGNOSIS — Z7902 Long term (current) use of antithrombotics/antiplatelets: Secondary | ICD-10-CM | POA: Diagnosis not present

## 2015-07-26 DIAGNOSIS — E785 Hyperlipidemia, unspecified: Secondary | ICD-10-CM | POA: Diagnosis present

## 2015-07-26 DIAGNOSIS — K5730 Diverticulosis of large intestine without perforation or abscess without bleeding: Secondary | ICD-10-CM | POA: Diagnosis present

## 2015-07-26 DIAGNOSIS — F329 Major depressive disorder, single episode, unspecified: Secondary | ICD-10-CM | POA: Diagnosis present

## 2015-07-26 DIAGNOSIS — K589 Irritable bowel syndrome without diarrhea: Secondary | ICD-10-CM | POA: Diagnosis present

## 2015-07-26 DIAGNOSIS — G4730 Sleep apnea, unspecified: Secondary | ICD-10-CM | POA: Diagnosis present

## 2015-07-26 DIAGNOSIS — E669 Obesity, unspecified: Secondary | ICD-10-CM | POA: Diagnosis present

## 2015-07-26 DIAGNOSIS — Z90710 Acquired absence of both cervix and uterus: Secondary | ICD-10-CM | POA: Diagnosis not present

## 2015-07-26 DIAGNOSIS — Z7984 Long term (current) use of oral hypoglycemic drugs: Secondary | ICD-10-CM | POA: Diagnosis not present

## 2015-07-26 DIAGNOSIS — Z298 Encounter for other specified prophylactic measures: Secondary | ICD-10-CM | POA: Diagnosis not present

## 2015-07-26 DIAGNOSIS — I10 Essential (primary) hypertension: Secondary | ICD-10-CM | POA: Diagnosis not present

## 2015-07-26 DIAGNOSIS — Z87891 Personal history of nicotine dependence: Secondary | ICD-10-CM | POA: Diagnosis not present

## 2015-07-26 DIAGNOSIS — Z85820 Personal history of malignant melanoma of skin: Secondary | ICD-10-CM | POA: Diagnosis not present

## 2015-07-26 DIAGNOSIS — G8918 Other acute postprocedural pain: Secondary | ICD-10-CM | POA: Diagnosis not present

## 2015-08-10 ENCOUNTER — Other Ambulatory Visit (INDEPENDENT_AMBULATORY_CARE_PROVIDER_SITE_OTHER): Payer: Medicare Other

## 2015-08-10 DIAGNOSIS — E119 Type 2 diabetes mellitus without complications: Secondary | ICD-10-CM

## 2015-08-10 DIAGNOSIS — I10 Essential (primary) hypertension: Secondary | ICD-10-CM

## 2015-08-10 DIAGNOSIS — E785 Hyperlipidemia, unspecified: Secondary | ICD-10-CM

## 2015-08-10 LAB — COMPREHENSIVE METABOLIC PANEL
ALBUMIN: 4.2 g/dL (ref 3.5–5.2)
ALT: 22 U/L (ref 0–35)
AST: 18 U/L (ref 0–37)
Alkaline Phosphatase: 40 U/L (ref 39–117)
BUN: 23 mg/dL (ref 6–23)
CALCIUM: 9.9 mg/dL (ref 8.4–10.5)
CO2: 30 mEq/L (ref 19–32)
Chloride: 103 mEq/L (ref 96–112)
Creatinine, Ser: 0.69 mg/dL (ref 0.40–1.20)
GFR: 90.3 mL/min (ref >60.00)
Glucose, Bld: 126 mg/dL — ABNORMAL HIGH (ref 70–99)
Potassium: 4 mEq/L (ref 3.5–5.1)
Sodium: 141 mEq/L (ref 135–145)
Total Bilirubin: 0.6 mg/dL (ref 0.2–1.2)
Total Protein: 7 g/dL (ref 6.0–8.3)

## 2015-08-10 LAB — LIPID PANEL
CHOLESTEROL: 151 mg/dL (ref 0–200)
HDL: 41.1 mg/dL (ref >39.00)
LDL Cholesterol: 88 mg/dL (ref 0–99)
NonHDL: 109.81
TRIGLYCERIDES: 108 mg/dL (ref 0.0–149.0)
Total CHOL/HDL Ratio: 4
VLDL: 21.6 mg/dL (ref 0.0–40.0)

## 2015-08-10 LAB — HEMOGLOBIN A1C: Hgb A1c MFr Bld: 5.3 % (ref 4.6–6.5)

## 2015-08-13 ENCOUNTER — Ambulatory Visit (INDEPENDENT_AMBULATORY_CARE_PROVIDER_SITE_OTHER): Payer: Medicare Other | Admitting: Family Medicine

## 2015-08-13 ENCOUNTER — Encounter: Payer: Self-pay | Admitting: Family Medicine

## 2015-08-13 VITALS — BP 118/68 | HR 68 | Temp 97.8°F | Ht 62.0 in | Wt 190.5 lb

## 2015-08-13 DIAGNOSIS — E669 Obesity, unspecified: Secondary | ICD-10-CM | POA: Diagnosis not present

## 2015-08-13 DIAGNOSIS — I10 Essential (primary) hypertension: Secondary | ICD-10-CM | POA: Diagnosis not present

## 2015-08-13 DIAGNOSIS — E119 Type 2 diabetes mellitus without complications: Secondary | ICD-10-CM

## 2015-08-13 DIAGNOSIS — K219 Gastro-esophageal reflux disease without esophagitis: Secondary | ICD-10-CM | POA: Diagnosis not present

## 2015-08-13 MED ORDER — LISINOPRIL 2.5 MG PO TABS: 2.5000 mg | ORAL_TABLET | Freq: Every day | ORAL | 11 refills | Status: DC

## 2015-08-13 MED ORDER — OMEPRAZOLE 20 MG PO CPDR: 20.0000 mg | DELAYED_RELEASE_CAPSULE | Freq: Every day | ORAL | 3 refills | Status: DC

## 2015-08-13 NOTE — Assessment & Plan Note (Signed)
Commended on great job so far!  Discussed how this problem influences overall health and the risks it imposes  Reviewed plan for weight loss with lower calorie diet (via better food choices and also portion control or program like weight watchers) and exercise building up to or more than 30 minutes 5 days per week including some aerobic activity

## 2015-08-13 NOTE — Assessment & Plan Note (Signed)
Refilled omeprazole Is on plavix short term -for another week/aware of  Interaction  Hope to eventually not need this with continued wt loss

## 2015-08-13 NOTE — Assessment & Plan Note (Signed)
Lab Results  Component Value Date   HGBA1C 5.3 08/10/2015   Much improvement with lifestyle change (classes) and wt loss and metformin  Urged to keep it up! Update if hypoglycemic Start lisinopril 2.5 for renal protection-disc poss side eff in detail (will update) F/u 6 mo

## 2015-08-13 NOTE — Progress Notes (Signed)
Subjective:    Patient ID: Andrea Cooper, female    DOB: [birth year], 65 y.o.   MRN: [id number]  HPI  Here for f/u of chronic health problems   Wt Readings from Last 3 Encounters:  08/13/15 190 lb 8 oz (86.4 kg)  07/15/15 197 lb 12.8 oz (89.7 kg)  06/17/15 203 lb 4.8 oz (92.2 kg)  bmi is 34-obese range/improved  bp is stable today  No cp or palpitations or headaches or edema  No side effects to medicines  BP Readings from Last 3 Encounters:  08/13/15 118/68  07/15/15 (!) 136/96  05/24/15 (!) 140/96     Was ref to DM teaching last time and also px metformin  Lab Results  Component Value Date   HGBA1C 5.3 08/10/2015  metformin-had to come off of 2 d before each surgery - tolerating well  This is down from 7.5  Tends to have higher glucose in am  Much improvement  DM teaching really helped  Walking and water exercise for exercise  Not on ace -will try very small  Last eye exam 1/17   Also had a stent put in one of her cerebral aneurysm -doing well from that   Patient Active Problem List   Diagnosis Date Noted  . Fat necrosis (segmental) of breast 08/27/2014  . Left breast mass 07/29/2014  . Breast pain 07/25/2014  . Visit for screening mammogram 07/25/2014  . Breast pain, left 07/14/2014  . Encounter for Medicare annual wellness exam 07/14/2014  . Hypokalemia 12/15/2013  . Other screening mammogram 06/07/2011  . Routine general medical examination at a health care facility 05/31/2011  . SLEEP APNEA 09/13/2009  . Obesity 01/13/2009  . POSTMENOPAUSAL STATUS 10/01/2008  . BACK PAIN, LUMBAR, WITH RADICULOPATHY 08/10/2008  . SWEATING 07/15/2008  . Diabetes type 2, controlled ([place]) 05/22/2007  . TOBACCO USE, QUIT 05/22/2007  . CEREBRAL ANEURYSM 08/09/2006  . Hypothyroidism 07/25/2006  . Hyperlipidemia 07/25/2006  . ANXIETY 07/25/2006  . DEPRESSION 07/25/2006  . Essential hypertension 07/25/2006  . IBS 07/25/2006  . OVEREATING 07/25/2006  . SKIN CANCER, HX OF  07/25/2006   Past Medical History:  Diagnosis Date  . GERD (gastroesophageal reflux disease)   . History of anxiety   . History of cerebral aneurysm repair    history of cerebral aneurysm   . History of depression   . History of hyperglycemia   . History of hyperlipidemia   . History of hypothyroidism   . History of skin cancer    hx of melanoma left lower arm, recurrance  . IBS (irritable bowel syndrome)   . Thyroid disease    Past Surgical History:  Procedure Laterality Date  . APPENDECTOMY  1966  . BREAST BIOPSY Left 07-29-14   FIBROCYSTIC CHANGES WITH FEW MICROCALCIFICATIONS.  Marland Kitchen cardiolite  12/1997   normal EF 68%  . CEREBRAL ANEURYSM REPAIR  2008, 2009   coiling at [facility]  . EYE SURGERY     RR  . FOOT SURGERY  02/1998   bilateral  . MOHS SURGERY  1992  . TOE SURGERY Left 2015  . TONSILLECTOMY  1964  . TOTAL ABDOMINAL HYSTERECTOMY W/ BILATERAL SALPINGOOPHORECTOMY     Social History  Substance Use Topics  . Smoking status: Former Research scientist (life sciences)  . Smokeless tobacco: Former Systems developer    Quit date: 06/07/2006  . Alcohol use 0.6 oz/week    1 Standard drinks or equivalent per week     Comment: occasional wine   Family History  Problem Relation Age of Onset  . Diabetes Father   . Diabetes Mother   . Depression Father   . CAD Father   . Diverticulosis Mother   . Adrenal disorder Mother   . Breast cancer Other     distant relatives (all on mother's side)  . Cerebral aneurysm Sister    Allergies  Allergen Reactions  . Codeine Nausea And Vomiting    REACTION: nausea and vomiting  . Latex Rash   Current Outpatient Prescriptions on File Prior to Visit  Medication Sig Dispense Refill  . aspirin 325 MG tablet Take 325 mg by mouth daily.    Marland Kitchen buPROPion (WELLBUTRIN XL) 300 MG 24 hr tablet Take 300 mg by mouth daily.    . clopidogrel (PLAVIX) 75 MG tablet Take 75 mg by mouth daily.    Marland Kitchen FLUoxetine (PROZAC) 40 MG capsule Take 80 mg by mouth daily.    Marland Kitchen glucose blood (ONE TOUCH  ULTRA TEST) test strip Check blood sugar twice a day and as directed. Dx E11.9 200 each 1  . hydrochlorothiazide (HYDRODIURIL) 25 MG tablet TAKE ONE TABLET BY MOUTH ONCE DAILY 90 tablet 1  . levothyroxine (SYNTHROID, LEVOTHROID) 175 MCG tablet TAKE ONE TABLET BY MOUTH EVERY DAY BEFORE BREAKFAST 90 tablet 1  . LORazepam (ATIVAN) 1 MG tablet Take 1 tablet by mouth daily as needed. Reported on 05/12/2015    . metFORMIN (GLUCOPHAGE) 500 MG tablet Take 1 tablet (500 mg total) by mouth 2 (two) times daily with a meal. 180 tablet 1  . ONETOUCH DELICA LANCETS 99991111 MISC Check blood sugar twice a day and as directed. Dx E11.9 200 each 1  . potassium chloride (K-DUR,KLOR-CON) 10 MEQ tablet TAKE ONE TABLET BY MOUTH ONCE DAILY 90 tablet 1  . pravastatin (PRAVACHOL) 20 MG tablet TAKE ONE TABLET BY MOUTH ONCE DAILY 90 tablet 1  . propranolol ER (INDERAL LA) 80 MG 24 hr capsule TAKE ONE CAPSULE BY MOUTH ONCE DAILY 90 capsule 1   No current facility-administered medications on file prior to visit.     Review of Systems    Review of Systems  Constitutional: Negative for fever, appetite change, fatigue and unexpected weight change.  Eyes: Negative for pain and visual disturbance.  Respiratory: Negative for cough and shortness of breath.   Cardiovascular: Negative for cp or palpitations    Gastrointestinal: Negative for nausea, diarrhea and constipation.  Genitourinary: Negative for urgency and frequency.  Skin: Negative for pallor or rash   Neurological: Negative for weakness, light-headedness, numbness and headaches.  Hematological: Negative for adenopathy. Does not bruise/bleed easily.  Psychiatric/Behavioral: Negative for dysphoric mood. The patient is not nervous/anxious.      Objective:   Physical Exam  Constitutional: She appears well-developed and well-nourished. No distress.  obese and well appearing   HENT:  Head: Normocephalic and atraumatic.  Mouth/Throat: Oropharynx is clear and moist.    Eyes: Conjunctivae and EOM are normal. Pupils are equal, round, and reactive to light.  Neck: Normal range of motion. Neck supple. No JVD present. Carotid bruit is not present. No thyromegaly present.  Cardiovascular: Normal rate, regular rhythm, normal heart sounds and intact distal pulses.  Exam reveals no gallop.   Pulmonary/Chest: Effort normal and breath sounds normal. No respiratory distress. She has no wheezes. She has no rales.  No crackles  Abdominal: Soft. Bowel sounds are normal. She exhibits no distension, no abdominal bruit and no mass. There is no tenderness.  Musculoskeletal: She exhibits no edema.  Lymphadenopathy:    She has no cervical adenopathy.  Neurological: She is alert. She has normal reflexes. No cranial nerve deficit. She exhibits normal muscle tone. Coordination normal.  Skin: Skin is warm and dry. No rash noted.  Psychiatric: She has a normal mood and affect.          Assessment & Plan:   Problem List Items Addressed This Visit      Cardiovascular and Mediastinum   Essential hypertension    Adding very low dose ace for renal protection-will watch for low bp bp in fair control at this time  BP Readings from Last 1 Encounters:  08/13/15 118/68   No changes needed Disc lifstyle change with low sodium diet and exercise        Relevant Medications   lisinopril (PRINIVIL,ZESTRIL) 2.5 MG tablet     Digestive   GERD (gastroesophageal reflux disease)    Refilled omeprazole Is on plavix short term -for another week/aware of  Interaction  Hope to eventually not need this with continued wt loss       Relevant Medications   omeprazole (PRILOSEC) 20 MG capsule     Endocrine   Diabetes type 2, controlled ([place]) - Primary    Lab Results  Component Value Date   HGBA1C 5.3 08/10/2015   Much improvement with lifestyle change (classes) and wt loss and metformin  Urged to keep it up! Update if hypoglycemic Start lisinopril 2.5 for renal protection-disc  poss side eff in detail (will update) F/u 6 mo       Relevant Medications   lisinopril (PRINIVIL,ZESTRIL) 2.5 MG tablet     Other   Obesity    Commended on great job so far!  Discussed how this problem influences overall health and the risks it imposes  Reviewed plan for weight loss with lower calorie diet (via better food choices and also portion control or program like weight watchers) and exercise building up to or more than 30 minutes 5 days per week including some aerobic activity          Other Visit Diagnoses   None.

## 2015-08-13 NOTE — Progress Notes (Signed)
Pre visit review using our clinic review tool, if applicable. No additional management support is needed unless otherwise documented below in the visit note. 

## 2015-08-13 NOTE — Assessment & Plan Note (Signed)
Adding very low dose ace for renal protection-will watch for low bp bp in fair control at this time  BP Readings from Last 1 Encounters:  08/13/15 118/68   No changes needed Disc lifstyle change with low sodium diet and exercise

## 2015-08-13 NOTE — Patient Instructions (Addendum)
Start lisinopril 2.5 mg daily to protect your kidneys from diabetes  If you get side effects like a cough or if you are dizzy- stop it and let me know  Keep up the good work with diet/exercise  You are doing great!  Follow up in 6 months for medicare interview and annual exam

## 2015-08-19 DIAGNOSIS — I671 Cerebral aneurysm, nonruptured: Secondary | ICD-10-CM | POA: Diagnosis not present

## 2015-09-03 ENCOUNTER — Telehealth: Payer: Self-pay | Admitting: Family Medicine

## 2015-09-03 MED ORDER — LISINOPRIL 2.5 MG PO TABS: 2.5000 mg | ORAL_TABLET | Freq: Every day | ORAL | 3 refills | Status: DC

## 2015-09-03 NOTE — Telephone Encounter (Signed)
Left voicemail letting pt know Rx sent and advised her of Dr. Marliss Coots comments

## 2015-09-03 NOTE — Telephone Encounter (Signed)
Pt came in today dropped off [facility] diabetes testing log In dr tower in box  Pt also needs rx refill on the rx that dr tower started her on a month ago for diabetes.  Pt didn't know name of the meds.  She would like this sent [facility] mail [facility] Pt has enough for 1 more week

## 2015-09-03 NOTE — Telephone Encounter (Signed)
I will send lisinopril (that is what we added for renal protection) I will review log when it gets to my desk Thanks

## 2015-10-17 HISTORY — PX: INTRAOPERATIVE ARTERIOGRAM: SHX5157

## 2015-10-22 DIAGNOSIS — I671 Cerebral aneurysm, nonruptured: Secondary | ICD-10-CM | POA: Diagnosis not present

## 2015-10-22 DIAGNOSIS — I10 Essential (primary) hypertension: Secondary | ICD-10-CM | POA: Diagnosis not present

## 2015-10-22 DIAGNOSIS — E039 Hypothyroidism, unspecified: Secondary | ICD-10-CM | POA: Diagnosis not present

## 2015-10-22 DIAGNOSIS — Z79899 Other long term (current) drug therapy: Secondary | ICD-10-CM | POA: Diagnosis not present

## 2015-10-22 DIAGNOSIS — E6609 Other obesity due to excess calories: Secondary | ICD-10-CM | POA: Diagnosis not present

## 2015-10-22 DIAGNOSIS — E785 Hyperlipidemia, unspecified: Secondary | ICD-10-CM | POA: Diagnosis not present

## 2015-10-22 DIAGNOSIS — Z5181 Encounter for therapeutic drug level monitoring: Secondary | ICD-10-CM | POA: Diagnosis not present

## 2015-10-22 DIAGNOSIS — E119 Type 2 diabetes mellitus without complications: Secondary | ICD-10-CM | POA: Diagnosis not present

## 2015-10-22 DIAGNOSIS — Z794 Long term (current) use of insulin: Secondary | ICD-10-CM | POA: Diagnosis not present

## 2015-10-22 DIAGNOSIS — Z6833 Body mass index (BMI) 33.0-33.9, adult: Secondary | ICD-10-CM | POA: Diagnosis not present

## 2015-10-25 DIAGNOSIS — Z48812 Encounter for surgical aftercare following surgery on the circulatory system: Secondary | ICD-10-CM | POA: Diagnosis not present

## 2015-10-25 DIAGNOSIS — I671 Cerebral aneurysm, nonruptured: Secondary | ICD-10-CM | POA: Diagnosis not present

## 2015-10-25 DIAGNOSIS — Z7902 Long term (current) use of antithrombotics/antiplatelets: Secondary | ICD-10-CM | POA: Diagnosis not present

## 2015-10-25 DIAGNOSIS — Z7982 Long term (current) use of aspirin: Secondary | ICD-10-CM | POA: Diagnosis not present

## 2015-11-04 ENCOUNTER — Other Ambulatory Visit: Payer: Self-pay | Admitting: Family Medicine

## 2015-11-11 ENCOUNTER — Encounter: Payer: Self-pay | Admitting: Primary Care

## 2015-11-11 ENCOUNTER — Ambulatory Visit (INDEPENDENT_AMBULATORY_CARE_PROVIDER_SITE_OTHER): Payer: Medicare Other | Admitting: Primary Care

## 2015-11-11 VITALS — BP 144/96 | HR 71 | Temp 98.1°F | Ht 62.0 in | Wt 180.8 lb

## 2015-11-11 DIAGNOSIS — J069 Acute upper respiratory infection, unspecified: Secondary | ICD-10-CM | POA: Diagnosis not present

## 2015-11-11 MED ORDER — BENZONATATE 200 MG PO CAPS: 200.0000 mg | ORAL_CAPSULE | Freq: Three times a day (TID) | ORAL | 0 refills | Status: DC | PRN

## 2015-11-11 NOTE — Progress Notes (Signed)
Subjective:    Patient ID: Andrea Cooper, female    DOB: [birth year], 65 y.o.   MRN: [id number]  HPI  Andrea Cooper is a 65 year old female who presents today with a chief complaint of cough. She also reports headache, scratchy throat, nasal congestion, sneezing. Her symptoms began yesterday after her mothers funeral. She was also sitting outside last night during her grand daughters soccer game. She denies fevers, fatigue, sore throat, productive cough. She's not taken anything OTC for her symptoms.   Review of Systems  Constitutional: Positive for fatigue. Negative for chills and fever.  HENT: Positive for congestion, postnasal drip and sneezing. Negative for ear pain and sore throat.   Respiratory: Positive for cough. Negative for shortness of breath and wheezing.   Neurological: Positive for headaches.       Past Medical History:  Diagnosis Date  . GERD (gastroesophageal reflux disease)   . History of anxiety   . History of cerebral aneurysm repair    history of cerebral aneurysm   . History of depression   . History of hyperglycemia   . History of hyperlipidemia   . History of hypothyroidism   . History of skin cancer    hx of melanoma left lower arm, recurrance  . IBS (irritable bowel syndrome)   . Thyroid disease      Social History   Social History  . Marital status: Married    Spouse name: N/A  . Number of children: N/A  . Years of education: N/A   Occupational History  . Not on file.   Social History Main Topics  . Smoking status: Former Research scientist (life sciences)  . Smokeless tobacco: Former Systems developer    Quit date: 06/07/2006  . Alcohol use 0.6 oz/week    1 Standard drinks or equivalent per week     Comment: occasional wine  . Drug use: No  . Sexual activity: Not on file   Other Topics Concern  . Not on file   Social History Narrative  . No narrative on file    Past Surgical History:  Procedure Laterality Date  . APPENDECTOMY  1966  . BREAST BIOPSY Left 07-29-14   FIBROCYSTIC CHANGES WITH FEW MICROCALCIFICATIONS.  Marland Kitchen cardiolite  12/1997   normal EF 68%  . CEREBRAL ANEURYSM REPAIR  2008, 2009   coiling at [facility]  . EYE SURGERY     RR  . FOOT SURGERY  02/1998   bilateral  . MOHS SURGERY  1992  . TOE SURGERY Left 2015  . TONSILLECTOMY  1964  . TOTAL ABDOMINAL HYSTERECTOMY W/ BILATERAL SALPINGOOPHORECTOMY      Family History  Problem Relation Age of Onset  . Diabetes Father   . Diabetes Mother   . Depression Father   . CAD Father   . Diverticulosis Mother   . Adrenal disorder Mother   . Breast cancer Other     distant relatives (all on mother's side)  . Cerebral aneurysm Sister     Allergies  Allergen Reactions  . Codeine Nausea And Vomiting    REACTION: nausea and vomiting  . Latex Rash    Current Outpatient Prescriptions on File Prior to Visit  Medication Sig Dispense Refill  . aspirin 325 MG tablet Take 325 mg by mouth daily.    Marland Kitchen buPROPion (WELLBUTRIN XL) 300 MG 24 hr tablet Take 300 mg by mouth daily.    Marland Kitchen FLUoxetine (PROZAC) 40 MG capsule Take 80 mg by mouth daily.    Marland Kitchen  glucose blood (ONE TOUCH ULTRA TEST) test strip Check blood sugar twice a day and as directed. Dx E11.9 200 each 1  . hydrochlorothiazide (HYDRODIURIL) 25 MG tablet TAKE ONE TABLET BY MOUTH ONCE DAILY 90 tablet 0  . KLOR-CON M10 10 MEQ tablet TAKE ONE TABLET BY MOUTH ONCE DAILY 90 tablet 0  . levothyroxine (SYNTHROID, LEVOTHROID) 175 MCG tablet TAKE ONE TABLET BY MOUTH ONCE DAILY BEFORE BREAKFAST 90 tablet 0  . lisinopril (PRINIVIL,ZESTRIL) 2.5 MG tablet Take 1 tablet (2.5 mg total) by mouth daily. 90 tablet 3  . LORazepam (ATIVAN) 1 MG tablet Take 1 tablet by mouth daily as needed. Reported on 05/12/2015    . metFORMIN (GLUCOPHAGE) 500 MG tablet Take 1 tablet (500 mg total) by mouth 2 (two) times daily with a meal. 180 tablet 1  . omeprazole (PRILOSEC) 20 MG capsule Take 1 capsule (20 mg total) by mouth daily. 90 capsule 3  . ONETOUCH DELICA LANCETS 99991111 MISC Check  blood sugar twice a day and as directed. Dx E11.9 200 each 1  . pravastatin (PRAVACHOL) 20 MG tablet TAKE ONE TABLET BY MOUTH ONCE DAILY 90 tablet 0  . propranolol ER (INDERAL LA) 80 MG 24 hr capsule TAKE ONE CAPSULE BY MOUTH ONCE DAILY 90 capsule 0   No current facility-administered medications on file prior to visit.     BP (!) 144/96   Pulse 71   Temp 98.1 F (36.7 C) (Oral)   Ht 5\' 2"  (1.575 m)   Wt 180 lb 12.8 oz (82 kg)   SpO2 95%   BMI 33.07 kg/m    Objective:   Physical Exam  Constitutional: She appears well-nourished.  HENT:  Right Ear: Tympanic membrane and ear canal normal.  Left Ear: Tympanic membrane and ear canal normal.  Nose: Mucosal edema present. Right sinus exhibits no maxillary sinus tenderness and no frontal sinus tenderness. Left sinus exhibits no maxillary sinus tenderness and no frontal sinus tenderness.  Mouth/Throat: Oropharynx is clear and moist.  Eyes: Conjunctivae are normal.  Neck: Neck supple.  Cardiovascular: Normal rate and regular rhythm.   Pulmonary/Chest: Effort normal and breath sounds normal. She has no wheezes. She has no rales.  Lymphadenopathy:    She has no cervical adenopathy.  Skin: Skin is warm and dry.          Assessment & Plan:  URI:  Cough, congestion, sneezing, fatigue x 24 hours. Was outdoors for her mothers funeral and also for her grand-daughters soccer game. She's taken nothing OTC for symptoms. Exam today unremarkable. Clear lungs, stable vitals, does not appear acutely ill. Suspect viral cold and will treat with conservative measures. Rx for Tessalon Pearls sent to pharmacy. Discussed Flonase and Coricidin. Fluids, rest, return precautions provided.  Sheral Flow, NP

## 2015-11-11 NOTE — Progress Notes (Signed)
Pre visit review using our clinic review tool, if applicable. No additional management support is needed unless otherwise documented below in the visit note. 

## 2015-11-11 NOTE — Patient Instructions (Addendum)
Your symptoms are representative of a viral illness which will resolve on its own over time. Our goal is to treat your symptoms in order to aid your body in the healing process and to make you more comfortable.   Nasal Congestion/Ear Pressure: Try using Flonase (fluticasone) nasal spray. Instill 1 spray in each nostril twice daily. This may be purchased over the counter.  Sneezing, scratchy throat, nasal congestion: Try Coricidin. This may be purchased over the counter.  You may take Benzonatate capsules for cough. Take 1 capsule by mouth three times daily as needed for cough.  Please notify me if you develop persistent fevers of 101, start coughing up green mucous, notice increased fatigue or weakness, or feel worse after 1 week of onset of symptoms.   Increase consumption of water intake and rest.  It was a pleasure meeting you!   Upper Respiratory Infection, Adult Most upper respiratory infections (URIs) are a viral infection of the air passages leading to the lungs. A URI affects the nose, throat, and upper air passages. The most common type of URI is nasopharyngitis and is typically referred to as "the common cold." URIs run their course and usually go away on their own. Most of the time, a URI does not require medical attention, but sometimes a bacterial infection in the upper airways can follow a viral infection. This is called a secondary infection. Sinus and middle ear infections are common types of secondary upper respiratory infections. Bacterial pneumonia can also complicate a URI. A URI can worsen asthma and chronic obstructive pulmonary disease (COPD). Sometimes, these complications can require emergency medical care and may be life threatening.  CAUSES Almost all URIs are caused by viruses. A virus is a type of germ and can spread from one person to another.  RISKS FACTORS You may be at risk for a URI if:   You smoke.   You have chronic heart or lung disease.  You have a  weakened defense (immune) system.   You are very young or very old.   You have nasal allergies or asthma.  You work in crowded or poorly ventilated areas.  You work in health care facilities or schools. SIGNS AND SYMPTOMS  Symptoms typically develop 2-3 days after you come in contact with a cold virus. Most viral URIs last 7-10 days. However, viral URIs from the influenza virus (flu virus) can last 14-18 days and are typically more severe. Symptoms may include:   Runny or stuffy (congested) nose.   Sneezing.   Cough.   Sore throat.   Headache.   Fatigue.   Fever.   Loss of appetite.   Pain in your forehead, behind your eyes, and over your cheekbones (sinus pain).  Muscle aches.  DIAGNOSIS  Your health care provider may diagnose a URI by:  Physical exam.  Tests to check that your symptoms are not due to another condition such as:  Strep throat.  Sinusitis.  Pneumonia.  Asthma. TREATMENT  A URI goes away on its own with time. It cannot be cured with medicines, but medicines may be prescribed or recommended to relieve symptoms. Medicines may help:  Reduce your fever.  Reduce your cough.  Relieve nasal congestion. HOME CARE INSTRUCTIONS   Take medicines only as directed by your health care provider.   Gargle warm saltwater or take cough drops to comfort your throat as directed by your health care provider.  Use a warm mist humidifier or inhale steam from a shower to increase  air moisture. This may make it easier to breathe.  Drink enough fluid to keep your urine clear or pale yellow.   Eat soups and other clear broths and maintain good nutrition.   Rest as needed.   Return to work when your temperature has returned to normal or as your health care provider advises. You may need to stay home longer to avoid infecting others. You can also use a face mask and careful hand washing to prevent spread of the virus.  Increase the usage of your  inhaler if you have asthma.   Do not use any tobacco products, including cigarettes, chewing tobacco, or electronic cigarettes. If you need help quitting, ask your health care provider. PREVENTION  The best way to protect yourself from getting a cold is to practice good hygiene.   Avoid oral or hand contact with people with cold symptoms.   Wash your hands often if contact occurs.  There is no clear evidence that vitamin C, vitamin E, echinacea, or exercise reduces the chance of developing a cold. However, it is always recommended to get plenty of rest, exercise, and practice good nutrition.  SEEK MEDICAL CARE IF:   You are getting worse rather than better.   Your symptoms are not controlled by medicine.   You have chills.  You have worsening shortness of breath.  You have brown or red mucus.  You have yellow or brown nasal discharge.  You have pain in your face, especially when you bend forward.  You have a fever.  You have swollen neck glands.  You have pain while swallowing.  You have white areas in the back of your throat. SEEK IMMEDIATE MEDICAL CARE IF:   You have severe or persistent:  Headache.  Ear pain.  Sinus pain.  Chest pain.  You have chronic lung disease and any of the following:  Wheezing.  Prolonged cough.  Coughing up blood.  A change in your usual mucus.  You have a stiff neck.  You have changes in your:  Vision.  Hearing.  Thinking.  Mood. MAKE SURE YOU:   Understand these instructions.  Will watch your condition.  Will get help right away if you are not doing well or get worse.   This information is not intended to replace advice given to you by your health care provider. Make sure you discuss any questions you have with your health care provider.   Document Released: 06/28/2000 Document Revised: 05/19/2014 Document Reviewed: 04/09/2013 Elsevier Interactive Patient Education [employer].

## 2015-12-13 ENCOUNTER — Telehealth: Payer: Self-pay | Admitting: Family Medicine

## 2015-12-13 NOTE — Telephone Encounter (Signed)
Pt called and she needs all of her medications to be changed to a new pharmacy.  Best number to call is [phone]

## 2015-12-14 NOTE — Telephone Encounter (Signed)
Called pt and no answer, phone just kept ringing

## 2015-12-16 NOTE — Telephone Encounter (Signed)
Patient calls to check on status of changing her prescriptions over to a new pharmacy due to insurance change.  Says she hasn't heard from anyone and left a different number than noted in initial note.  Please call patient at [phone].

## 2015-12-16 NOTE — Telephone Encounter (Signed)
Called pt and she wasn't at home to get the list of meds and name of pharmacy so she will call back once she goes home

## 2015-12-21 ENCOUNTER — Other Ambulatory Visit: Payer: Self-pay | Admitting: Family Medicine

## 2015-12-21 ENCOUNTER — Telehealth: Payer: Self-pay | Admitting: *Deleted

## 2015-12-21 ENCOUNTER — Ambulatory Visit (INDEPENDENT_AMBULATORY_CARE_PROVIDER_SITE_OTHER): Payer: Medicare Other

## 2015-12-21 DIAGNOSIS — Z23 Encounter for immunization: Secondary | ICD-10-CM

## 2015-12-21 MED ORDER — LEVOTHYROXINE SODIUM 175 MCG PO TABS: ORAL_TABLET | ORAL | 1 refills | Status: DC

## 2015-12-21 MED ORDER — PROPRANOLOL HCL ER 80 MG PO CP24: 80.0000 mg | ORAL_CAPSULE | Freq: Every day | ORAL | 1 refills | Status: DC

## 2015-12-21 MED ORDER — OMEPRAZOLE 20 MG PO CPDR
20.0000 mg | DELAYED_RELEASE_CAPSULE | Freq: Every day | ORAL | 1 refills | Status: DC
Start: 2015-12-21 — End: 2016-08-05

## 2015-12-21 MED ORDER — PRAVASTATIN SODIUM 20 MG PO TABS: 20.0000 mg | ORAL_TABLET | Freq: Every day | ORAL | 1 refills | Status: DC

## 2015-12-21 MED ORDER — POTASSIUM CHLORIDE CRYS ER 10 MEQ PO TBCR: 10.0000 meq | EXTENDED_RELEASE_TABLET | Freq: Every day | ORAL | 1 refills | Status: DC

## 2015-12-21 MED ORDER — METFORMIN HCL 500 MG PO TABS: 500.0000 mg | ORAL_TABLET | Freq: Two times a day (BID) | ORAL | 1 refills | Status: DC

## 2015-12-21 MED ORDER — GLUCOSE BLOOD VI STRP: ORAL_STRIP | 1 refills | Status: DC

## 2015-12-21 MED ORDER — LISINOPRIL 2.5 MG PO TABS: 2.5000 mg | ORAL_TABLET | Freq: Every day | ORAL | 1 refills | Status: DC

## 2015-12-21 MED ORDER — HYDROCHLOROTHIAZIDE 25 MG PO TABS: 25.0000 mg | ORAL_TABLET | Freq: Every day | ORAL | 1 refills | Status: DC

## 2015-12-21 NOTE — Telephone Encounter (Signed)
Rxs sent to new mail order pharmacy

## 2015-12-21 NOTE — Telephone Encounter (Signed)
PT came in to let us know that her pharmacy has to change due to her insurance. She brought in where her prescriptions need to go. Please send all prescriptions to this new pharmacy. Letter placed in Rx tower.

## 2015-12-21 NOTE — Telephone Encounter (Signed)
Pt brought in list of meds, see other phone note

## 2015-12-28 ENCOUNTER — Other Ambulatory Visit: Payer: Self-pay

## 2015-12-28 MED ORDER — GLUCOSE BLOOD VI STRP: ORAL_STRIP | 1 refills | Status: DC

## 2015-12-28 NOTE — Telephone Encounter (Signed)
Pt request One touch ultra test strips to go to [facility] [address]. The new mailorder pharmacy does not start until 01/2016. Advised pt done.

## 2016-01-03 ENCOUNTER — Other Ambulatory Visit: Payer: Self-pay

## 2016-01-03 MED ORDER — METFORMIN HCL 500 MG PO TABS: 500.0000 mg | ORAL_TABLET | Freq: Two times a day (BID) | ORAL | 0 refills | Status: DC

## 2016-01-03 NOTE — Telephone Encounter (Signed)
Pt said mail order does not start until 01/17/2016. Pt request 3 [address] to [facility] [address]. Advised done per protocol.

## 2016-01-17 ENCOUNTER — Telehealth: Payer: Self-pay | Admitting: Family Medicine

## 2016-01-17 DIAGNOSIS — E119 Type 2 diabetes mellitus without complications: Secondary | ICD-10-CM

## 2016-01-17 DIAGNOSIS — I10 Essential (primary) hypertension: Secondary | ICD-10-CM

## 2016-01-17 DIAGNOSIS — E039 Hypothyroidism, unspecified: Secondary | ICD-10-CM

## 2016-01-17 DIAGNOSIS — E7800 Pure hypercholesterolemia, unspecified: Secondary | ICD-10-CM

## 2016-01-17 NOTE — Telephone Encounter (Signed)
-----   Message from Eustace Pen, LPN sent at [id number]  2:34 PM EST ----- Regarding: Lab Orders 01/19/16 Please place lab orders. Thank you.

## 2016-01-19 ENCOUNTER — Ambulatory Visit (INDEPENDENT_AMBULATORY_CARE_PROVIDER_SITE_OTHER): Payer: Medicare Other

## 2016-01-19 ENCOUNTER — Other Ambulatory Visit: Payer: Medicare Other

## 2016-01-19 VITALS — BP 140/108 | HR 64 | Temp 98.7°F | Ht 61.5 in | Wt 174.5 lb

## 2016-01-19 DIAGNOSIS — E119 Type 2 diabetes mellitus without complications: Secondary | ICD-10-CM | POA: Diagnosis not present

## 2016-01-19 DIAGNOSIS — I10 Essential (primary) hypertension: Secondary | ICD-10-CM | POA: Diagnosis not present

## 2016-01-19 DIAGNOSIS — E039 Hypothyroidism, unspecified: Secondary | ICD-10-CM

## 2016-01-19 DIAGNOSIS — Z1159 Encounter for screening for other viral diseases: Secondary | ICD-10-CM

## 2016-01-19 DIAGNOSIS — Z0000 Encounter for general adult medical examination without abnormal findings: Secondary | ICD-10-CM

## 2016-01-19 DIAGNOSIS — E7800 Pure hypercholesterolemia, unspecified: Secondary | ICD-10-CM

## 2016-01-19 LAB — COMPREHENSIVE METABOLIC PANEL
ALBUMIN: 4.2 g/dL (ref 3.5–5.2)
ALT: 18 U/L (ref 0–35)
AST: 15 U/L (ref 0–37)
Alkaline Phosphatase: 38 U/L — ABNORMAL LOW (ref 39–117)
BILIRUBIN TOTAL: 0.5 mg/dL (ref 0.2–1.2)
BUN: 22 mg/dL (ref 6–23)
CALCIUM: 10 mg/dL (ref 8.4–10.5)
CO2: 32 meq/L (ref 19–32)
Chloride: 102 mEq/L (ref 96–112)
Creatinine, Ser: 0.56 mg/dL (ref 0.40–1.20)
GFR: 114.74 mL/min (ref >60.00)
Glucose, Bld: 94 mg/dL (ref 70–99)
Potassium: 3.4 mEq/L — ABNORMAL LOW (ref 3.5–5.1)
Sodium: 143 mEq/L (ref 135–145)
Total Protein: 6.8 g/dL (ref 6.0–8.3)

## 2016-01-19 LAB — CBC WITH DIFFERENTIAL/PLATELET
BASOS ABS: 0 10*3/uL (ref 0.0–0.1)
Basophils Relative: 0.6 % (ref 0.0–3.0)
Eosinophils Absolute: 0 10*3/uL (ref 0.0–0.7)
Eosinophils Relative: 0.7 % (ref 0.0–5.0)
HEMATOCRIT: 39.1 % (ref 36.0–46.0)
HEMOGLOBIN: 13.2 g/dL (ref 12.0–15.0)
LYMPHS PCT: 36.7 % (ref 12.0–46.0)
Lymphs Abs: 2.2 10*3/uL (ref 0.7–4.0)
MCHC: 33.8 g/dL (ref 30.0–36.0)
MCV: 87.4 fl (ref 78.0–100.0)
Monocytes Absolute: 0.4 10*3/uL (ref 0.1–1.0)
Monocytes Relative: 7.2 % (ref 3.0–12.0)
NEUTROS ABS: 3.3 10*3/uL (ref 1.4–7.7)
Neutrophils Relative %: 54.8 % (ref 43.0–77.0)
PLATELETS: 261 10*3/uL (ref 150.0–400.0)
RBC: 4.48 Mil/uL (ref 3.87–5.11)
RDW: 14.4 % (ref 11.5–15.5)
WBC: 5.9 10*3/uL (ref 4.0–10.5)

## 2016-01-19 LAB — LIPID PANEL
CHOLESTEROL: 172 mg/dL (ref 0–200)
HDL: 58 mg/dL (ref >39.00)
LDL CALC: 101 mg/dL — AB (ref 0–99)
NonHDL: 113.51
TRIGLYCERIDES: 61 mg/dL (ref 0.0–149.0)
Total CHOL/HDL Ratio: 3
VLDL: 12.2 mg/dL (ref 0.0–40.0)

## 2016-01-19 LAB — HEMOGLOBIN A1C: HEMOGLOBIN A1C: 5.4 % (ref 4.6–6.5)

## 2016-01-19 LAB — TSH: TSH: 0.12 u[IU]/mL — AB (ref 0.35–4.50)

## 2016-01-19 NOTE — Patient Instructions (Signed)
Andrea Cooper , Thank you for taking time to come for your Medicare Wellness Visit. I appreciate your ongoing commitment to your health goals. Please review the following plan we discussed and let me know if I can assist you in the future.   These are the goals we discussed: Goals    . montioring carb intake          Starting 01/19/2016, I will continue to monitor intake of simple carbs by limiting bread, crackers, and processed foods.        This is a list of the screening recommended for you and due dates:  Health Maintenance  Topic Date Due  . Pneumonia vaccines (2 of 2 - PPSV23) 04/15/2016*  . Mammogram  07/22/2016*  . DEXA scan (bone density measurement)  01/18/2017*  . Tetanus Vaccine  04/15/2025*  . Eye exam for diabetics  01/31/2016  . Hemoglobin A1C  02/10/2016  . Complete foot exam   08/12/2016  . Colon Cancer Screening  08/23/2023  . Flu Shot  Addressed  . Shingles Vaccine  Completed  .  Hepatitis C: One time screening is recommended by Center for Disease Control  (CDC) for  adults born from [birth year] through 1967.   Completed  *Topic was postponed. The date shown is not the original due date.   Preventive Care for Adults  A healthy lifestyle and preventive care can promote health and wellness. Preventive health guidelines for adults include the following key practices.  . A routine yearly physical is a good way to check with your health care provider about your health and preventive screening. It is a chance to share any concerns and updates on your health and to receive a thorough exam.  . Visit your dentist for a routine exam and preventive care every 6 months. Brush your teeth twice a day and floss once a day. Good oral hygiene prevents tooth decay and gum disease.  . The frequency of eye exams is based on your age, health, family medical history, use  of contact lenses, and other factors. Follow your health care provider's ecommendations for frequency of eye exams.  .  Eat a healthy diet. Foods like vegetables, fruits, whole grains, low-fat dairy products, and lean protein foods contain the nutrients you need without too many calories. Decrease your intake of foods high in solid fats, added sugars, and salt. Eat the right amount of calories for you. Get information about a proper diet from your health care provider, if necessary.  . Regular physical exercise is one of the most important things you can do for your health. Most adults should get at least 150 minutes of moderate-intensity exercise (any activity that increases your heart rate and causes you to sweat) each week. In addition, most adults need muscle-strengthening exercises on 2 or more days a week.  Silver Sneakers may be a benefit available to you. To determine eligibility, you may visit the website: www.silversneakers.com or contact program at (580) 304-1975 Mon-Fri between 8AM-8PM.   . Maintain a healthy weight. The body mass index (BMI) is a screening tool to identify possible weight problems. It provides an estimate of body fat based on height and weight. Your health care provider can find your BMI and can help you achieve or maintain a healthy weight.   For adults 20 years and older: ? A BMI below 18.5 is considered underweight. ? A BMI of 18.5 to 24.9 is normal. ? A BMI of 25 to 29.9 is considered overweight. ? A  BMI of 30 and above is considered obese.   . Maintain normal blood lipids and cholesterol levels by exercising and minimizing your intake of saturated fat. Eat a balanced diet with plenty of fruit and vegetables. Blood tests for lipids and cholesterol should begin at age 71 and be repeated every 5 years. If your lipid or cholesterol levels are high, you are over 50, or you are at high risk for heart disease, you may need your cholesterol levels checked more frequently. Ongoing high lipid and cholesterol levels should be treated with medicines if diet and exercise are not working.  . If  you smoke, find out from your health care provider how to quit. If you do not use tobacco, please do not start.  . If you choose to drink alcohol, please do not consume more than 2 drinks per day. One drink is considered to be 12 ounces (355 mL) of beer, 5 ounces (148 mL) of wine, or 1.5 ounces (44 mL) of liquor.  . If you are 52-60 years old, ask your health care provider if you should take aspirin to prevent strokes.  . Use sunscreen. Apply sunscreen liberally and repeatedly throughout the day. You should seek shade when your shadow is shorter than you. Protect yourself by wearing long sleeves, pants, a wide-brimmed hat, and sunglasses year round, whenever you are outdoors.  . Once a month, do a whole body skin exam, using a mirror to look at the skin on your back. Tell your health care provider of new moles, moles that have irregular borders, moles that are larger than a pencil eraser, or moles that have changed in shape or color.

## 2016-01-19 NOTE — Progress Notes (Signed)
PCP notes:   Health maintenance:  Mammogram - addressed; PCP will order  Bone density - pt will discuss with PCP at CPE Tetanus - postponed/insurance PPSV23 - pt will discuss with PCP at CPE  Abnormal screenings:   Hearing - failed  Patient concerns:   Pt is concerned about hearing loss. States she can no longer hear soft or whispered voices.   Pt is concerned some of her medications may be causing thinning and loss of hair.  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   Discussed bone density with pt and advised her this screening can often be scheduled same day as mammogram  Pt has been asked to discuss with PCP whether she should repeat PPSV23 vaccine at this time.  Next PCP appt:   01/24/16 @ 1430

## 2016-01-19 NOTE — Progress Notes (Signed)
Pre visit review using our clinic review tool, if applicable. No additional management support is needed unless otherwise documented below in the visit note. 

## 2016-01-19 NOTE — Progress Notes (Signed)
Subjective:   Andrea Cooper is a 66 y.o. female who presents for Medicare Annual (Subsequent) preventive examination.  Review of Systems:  N/A Cardiac Risk Factors include: advanced age (>52men, >44 women);obesity (BMI >30kg/m2);diabetes mellitus;hypertension;dyslipidemia     Objective:     Vitals: BP (!) 140/108 (BP Location: Left Arm, Patient Position: Sitting, Cuff Size: Normal) Comment: no AM BP medication taken  Pulse 64   Temp 98.7 F (37.1 C) (Oral)   Ht 5' 1.5" (1.562 m) Comment: no shoes  Wt 174 lb 8 oz (79.2 kg)   SpO2 96%   BMI 32.44 kg/m   Body mass index is 32.44 kg/m.   Tobacco History  Smoking Status  . Former Smoker  Smokeless Tobacco  . Former Systems developer  . Quit date: 06/07/2006     Counseling given: No   Past Medical History:  Diagnosis Date  . GERD (gastroesophageal reflux disease)   . History of anxiety   . History of cerebral aneurysm repair    history of cerebral aneurysm   . History of depression   . History of hyperglycemia   . History of hyperlipidemia   . History of hypothyroidism   . History of skin cancer    hx of melanoma left lower arm, recurrance  . IBS (irritable bowel syndrome)   . Thyroid disease    Past Surgical History:  Procedure Laterality Date  . APPENDECTOMY  1966  . BREAST BIOPSY Left 07-29-14   FIBROCYSTIC CHANGES WITH FEW MICROCALCIFICATIONS.  Marland Kitchen cardiolite  12/1997   normal EF 68%  . CEREBRAL ANEURYSM REPAIR  2008, 2009   coiling at [facility]  . EYE SURGERY     RR  . FOOT SURGERY  02/1998   bilateral  . INTRAOPERATIVE ARTERIOGRAM  10/17/2015  . [place]  . TOE SURGERY Left 2015  . TONSILLECTOMY  1964  . TOTAL ABDOMINAL HYSTERECTOMY W/ BILATERAL SALPINGOOPHORECTOMY     Family History  Problem Relation Age of Onset  . Diabetes Father   . Depression Father   . CAD Father   . Diabetes Mother   . Diverticulosis Mother   . Adrenal disorder Mother   . Breast cancer Other     distant relatives (all on  mother's side)  . Cerebral aneurysm Sister    History  Sexual Activity  . Sexual activity: No    Outpatient Encounter Prescriptions as of 01/19/2016  Medication Sig  . aspirin 325 MG tablet Take 325 mg by mouth daily.  Marland Kitchen buPROPion (WELLBUTRIN XL) 300 MG 24 hr tablet Take 300 mg by mouth daily.  Marland Kitchen FLUoxetine (PROZAC) 40 MG capsule Take 80 mg by mouth daily.  Marland Kitchen glucose blood (ONE TOUCH ULTRA TEST) test strip Check blood sugar twice a day and as directed. Dx E11.9  . hydrochlorothiazide (HYDRODIURIL) 25 MG tablet Take 1 tablet (25 mg total) by mouth daily.  Marland Kitchen levothyroxine (SYNTHROID, LEVOTHROID) 175 MCG tablet TAKE ONE TABLET BY MOUTH ONCE DAILY BEFORE BREAKFAST  . lisinopril (PRINIVIL,ZESTRIL) 2.5 MG tablet Take 1 tablet (2.5 mg total) by mouth daily.  Marland Kitchen LORazepam (ATIVAN) 1 MG tablet Take 1 tablet by mouth daily as needed. Reported on 05/12/2015  . metFORMIN (GLUCOPHAGE) 500 MG tablet Take 1 tablet (500 mg total) by mouth 2 (two) times daily with a meal.  . omeprazole (PRILOSEC) 20 MG capsule Take 1 capsule (20 mg total) by mouth daily.  [name] DELICA LANCETS [id number] MISC Check blood sugar twice a day and as  directed. Dx E11.9  . potassium chloride (KLOR-CON M10) 10 MEQ tablet Take 1 tablet (10 mEq total) by mouth daily.  . pravastatin (PRAVACHOL) 20 MG tablet Take 1 tablet (20 mg total) by mouth daily.  . propranolol ER (INDERAL LA) 80 MG 24 hr capsule Take 1 capsule (80 mg total) by mouth daily.  . [DISCONTINUED] benzonatate (TESSALON) 200 MG capsule Take 1 capsule (200 mg total) by mouth 3 (three) times daily as needed for cough.   No facility-administered encounter medications on file as of 01/19/2016.     Activities of Daily Living In your present state of health, do you have any difficulty performing the following activities: 01/19/2016  Hearing? Y  Vision? Y  Difficulty concentrating or making decisions? N  Walking or climbing stairs? Y  Dressing or bathing? N  Doing errands,  shopping? N  Preparing Food and eating ? N  Using the Toilet? N  In the past six months, have you accidently leaked urine? N  Do you have problems with loss of bowel control? N  Managing your Medications? N  Managing your Finances? N  Housekeeping or managing your Housekeeping? N  Some recent data might be hidden    Patient Care Team: Abner Greenspan, MD as PCP - General Robert Bellow, MD (General Surgery) Chucky May, MD as Consulting Physician (Psychiatry) Hoy Finlay, MD as Referring Physician (Neurosurgery) Reinaldo Berber III as Consulting Physician (Optometry)    Assessment:     Hearing Screening   125Hz  250Hz  500Hz  1000Hz  2000Hz  3000Hz  4000Hz  6000Hz  8000Hz   Right ear:   40 0 40  40    Left ear:   40 0 40  40    Vision Screening Comments: Last vision exam in January 2017   Exercise Activities and Dietary recommendations Current Exercise Habits: The patient does not participate in regular exercise at present, Exercise limited by: orthopedic condition(s)  Goals    . montioring carb intake          Starting 01/19/2016, I will continue to monitor intake of simple carbs by limiting bread, crackers, and processed foods.       Fall Risk Fall Risk  01/19/2016 07/15/2015 06/17/2015 06/10/2015 05/24/2015  Falls in the past year? Yes (No Data) Yes Yes Yes  Number falls in past yr: 2 or more - - - 1  Injury with Fall? No - - - -  Risk Factor Category  High Fall Risk - - - -   Depression Screen PHQ 2/9 Scores 01/19/2016 05/24/2015 07/14/2014  PHQ - 2 Score 0 0 0     Cognitive Function MMSE - Mini Mental State Exam 01/19/2016  Orientation to time 5  Orientation to Place 5  Registration 3  Attention/ Calculation 0  Recall 3  Language- name 2 objects 0  Language- repeat 1  Language- follow 3 step command 3  Language- read & follow direction 0  Write a sentence 0  Copy design 0  Total score 20     PLEASE NOTE: A Mini-Cog screen was completed. Maximum score is 20. A  value of 0 denotes this part of Folstein MMSE was not completed or the patient failed this part of the Mini-Cog screening.   Mini-Cog Screening Orientation to Time - Max 5 pts Orientation to Place - Max 5 pts Registration - Max 3 pts Recall - Max 3 pts Language Repeat - Max 1 pts Language Follow 3 Step Command - Max 3 pts    Immunization  History  Administered Date(s) Administered  . Influenza Whole 01/13/2009  . Influenza,inj,Quad PF,36+ Mos 10/07/2013, 12/21/2015  . Pneumococcal Conjugate-13 07/14/2014  . Pneumococcal Polysaccharide-23 10/01/2008  . Td 01/08/1997, 04/16/2005  . Zoster 12/15/2013   Screening Tests Health Maintenance  Topic Date Due  . PNA vac Low Risk Adult (2 of 2 - PPSV23) 04/15/2016 (Originally 07/14/2015)  . MAMMOGRAM  07/22/2016 (Originally 07/24/2015)  . DEXA SCAN  01/18/2017 (Originally 12/21/2013)  . TETANUS/TDAP  04/15/2025 (Originally 04/17/2015)  . OPHTHALMOLOGY EXAM  01/31/2016  . HEMOGLOBIN A1C  02/10/2016  . FOOT EXAM  08/12/2016  . COLONOSCOPY  08/23/2023  . INFLUENZA VACCINE  Addressed  . ZOSTAVAX  Completed  . Hepatitis C Screening  Completed      Plan:     I have personally reviewed and addressed the Medicare Annual Wellness questionnaire and have noted the following in the patient's chart:  A. Medical and social history B. Use of alcohol, tobacco or illicit drugs  C. Current medications and supplements D. Functional ability and status E.  Nutritional status F.  Physical activity G. Advance directives H. List of other physicians I.  Hospitalizations, surgeries, and ER visits in previous 12 months J.  [place] to include hearing, vision, cognitive, depression L. Referrals and appointments - none  In addition, I have reviewed and discussed with patient certain preventive protocols, quality metrics, and best practice recommendations. A written personalized care plan for preventive services as well as general preventive health  recommendations were provided to patient.  See attached scanned questionnaire for additional information.   Signed,   Lindell Noe, MHA, BS, LPN Health Coach'

## 2016-01-20 LAB — HEPATITIS C ANTIBODY: HCV Ab: NEGATIVE

## 2016-01-20 NOTE — Progress Notes (Signed)
I reviewed health advisor's note, was available for consultation, and agree with documentation and plan.  

## 2016-01-24 ENCOUNTER — Encounter: Payer: Medicare Other | Admitting: Family Medicine

## 2016-01-28 ENCOUNTER — Encounter: Payer: Medicare Other | Admitting: Family Medicine

## 2016-02-04 ENCOUNTER — Encounter: Payer: Self-pay | Admitting: Family Medicine

## 2016-02-07 MED ORDER — LEVOTHYROXINE SODIUM 150 MCG PO TABS: 150.0000 ug | ORAL_TABLET | Freq: Every day | ORAL | 3 refills | Status: DC

## 2016-02-14 ENCOUNTER — Ambulatory Visit (INDEPENDENT_AMBULATORY_CARE_PROVIDER_SITE_OTHER): Payer: Medicare Other | Admitting: Family Medicine

## 2016-02-14 ENCOUNTER — Encounter: Payer: Self-pay | Admitting: Family Medicine

## 2016-02-14 VITALS — BP 130/72 | HR 70 | Temp 97.7°F | Ht 61.5 in | Wt 176.5 lb

## 2016-02-14 DIAGNOSIS — E2839 Other primary ovarian failure: Secondary | ICD-10-CM | POA: Insufficient documentation

## 2016-02-14 DIAGNOSIS — Z1231 Encounter for screening mammogram for malignant neoplasm of breast: Secondary | ICD-10-CM | POA: Diagnosis not present

## 2016-02-14 DIAGNOSIS — I10 Essential (primary) hypertension: Secondary | ICD-10-CM

## 2016-02-14 DIAGNOSIS — Z6832 Body mass index (BMI) 32.0-32.9, adult: Secondary | ICD-10-CM

## 2016-02-14 DIAGNOSIS — E119 Type 2 diabetes mellitus without complications: Secondary | ICD-10-CM | POA: Diagnosis not present

## 2016-02-14 DIAGNOSIS — H9193 Unspecified hearing loss, bilateral: Secondary | ICD-10-CM | POA: Diagnosis not present

## 2016-02-14 DIAGNOSIS — Z23 Encounter for immunization: Secondary | ICD-10-CM | POA: Diagnosis not present

## 2016-02-14 DIAGNOSIS — E7800 Pure hypercholesterolemia, unspecified: Secondary | ICD-10-CM | POA: Diagnosis not present

## 2016-02-14 DIAGNOSIS — E6609 Other obesity due to excess calories: Secondary | ICD-10-CM | POA: Diagnosis not present

## 2016-02-14 DIAGNOSIS — E039 Hypothyroidism, unspecified: Secondary | ICD-10-CM | POA: Diagnosis not present

## 2016-02-14 DIAGNOSIS — I671 Cerebral aneurysm, nonruptured: Secondary | ICD-10-CM

## 2016-02-14 DIAGNOSIS — Z0000 Encounter for general adult medical examination without abnormal findings: Secondary | ICD-10-CM | POA: Diagnosis not present

## 2016-02-14 DIAGNOSIS — H9190 Unspecified hearing loss, unspecified ear: Secondary | ICD-10-CM | POA: Insufficient documentation

## 2016-02-14 MED ORDER — METFORMIN HCL 500 MG PO TABS: 500.0000 mg | ORAL_TABLET | Freq: Two times a day (BID) | ORAL | 3 refills | Status: DC

## 2016-02-14 NOTE — Patient Instructions (Addendum)
Please see the handout I gave you regarding getting a tetanus shot  Stop at check out for referral to audiology for hearing and mammogram and dexa  Try to get 1200-1500 mg of calcium per day with at least 1000 iu of vitamin D - for bone health   Pneumonia vaccine today   Schedule labs in about a month for your thyroid   Your potassium is slightly low- do not miss potassium doses  Eat green leafy vegetables and choose citrus fruit for your carb when you can

## 2016-02-14 NOTE — Assessment & Plan Note (Signed)
bp in fair control at this time  BP Readings from Last 1 Encounters:  02/14/16 130/72   No changes needed Disc lifstyle change with low sodium diet and exercise   Labs reviewed

## 2016-02-14 NOTE — Assessment & Plan Note (Signed)
Pt desires further eval  Ref to audiology

## 2016-02-14 NOTE — Assessment & Plan Note (Signed)
No symptoms s/p coiling  For another arteriogram in April

## 2016-02-14 NOTE — Assessment & Plan Note (Signed)
TSH is low and dose was reduced when labs came back  Planning TSH in about a month  She has been loosing hair lately

## 2016-02-14 NOTE — Assessment & Plan Note (Signed)
Discussed how this problem influences overall health and the risks it imposes  Reviewed plan for weight loss with lower calorie diet (via better food choices and also portion control or program like weight watchers) and exercise building up to or more than 30 minutes 5 days per week including some aerobic activity   Rev poss exercise that would be tolerated

## 2016-02-14 NOTE — Progress Notes (Signed)
Subjective:    Patient ID: Andrea Cooper, female    DOB: Aug 26, 1950, 66 y.o.   MRN: [id number]  HPI  Here for annual f/u of chronic medical problems   Had AMW early this month  Eye exam 01/31/15  Lost her mother in Oct - hard on her   (big adj in lifestyle)  She had dementia  Going to start grief counseling at [place] [facility] - still makes herself do things    Wt Readings from Last 3 Encounters:  02/14/16 176 lb 8 oz (80.1 kg)  01/19/16 174 lb 8 oz (79.2 kg)  11/11/15 180 lb 12.8 oz (82 kg)  bmi is 32.8 Down from the fall  Eating - doing well except for the holidays - really watches carbs and sugar  No regular exercise right now  Does park farther so she can walk   Tetanus shot 2007  PPSV23-needs that today   Noted concerns of hearing loss  She would like to have this checked out   Mammogram 7/16 neg   (with neg bx) Hx of fat necrosis of breast  Self exam -no lumps   She has had a total hysterectomy in the past  No gyn symptoms    dexa 2002- it was at [facility]  Not taking ca or D No falls or fractures    colonoscopy 8/15 nl with 10 year recall   UTD other imms     Hypothyroidism  Pt has no clinical changes No change in energy level but she is loosing more hair lately  Started new dose  Lab Results  Component Value Date   TSH 0.12 (L) 01/19/2016    bp is stable today  No cp or palpitations or headaches or edema  No side effects to medicines  BP Readings from Last 3 Encounters:  02/14/16 130/72  01/19/16 (!) 140/108  11/11/15 (!) 144/96       DM2 Lab Results  Component Value Date   HGBA1C 5.4 01/19/2016  doing very well with that  On metformin  She is eating a low glycemic diet    Hx of hyperlipidemia Lab Results  Component Value Date   CHOL 172 01/19/2016   CHOL 151 08/10/2015   CHOL 198 05/05/2015   Lab Results  Component Value Date   HDL 58.00 01/19/2016   HDL 41.10 08/10/2015   HDL 43.50 05/05/2015   Lab  Results  Component Value Date   LDLCALC 101 (H) 01/19/2016   LDLCALC 88 08/10/2015   LDLCALC 133 (H) 05/05/2015   Lab Results  Component Value Date   TRIG 61.0 01/19/2016   TRIG 108.0 08/10/2015   TRIG 106.0 05/05/2015   Lab Results  Component Value Date   CHOLHDL 3 01/19/2016   CHOLHDL 4 08/10/2015   CHOLHDL 5 05/05/2015   Lab Results  Component Value Date   LDLDIRECT 160.7 01/14/2007   LDLDIRECT 202.7 08/01/2006    On pravastatin  HDL is higher  LDL is 101- will watch diet   Has to have another arteriogram for aneurysm - stent did not work Will have that in April  occ HA- not to do with this   Hep C screen is negative   Review of Systems Review of Systems  Constitutional: Negative for fever, appetite change, fatigue and unexpected weight change.  Eyes: Negative for pain and visual disturbance.  Respiratory: Negative for cough and shortness of breath.   Cardiovascular: Negative for cp or palpitations  Gastrointestinal: Negative for nausea, diarrhea and constipation.  Genitourinary: Negative for urgency and frequency.  Skin: Negative for pallor or rash  pos for recent inc hair loss (no bald areas)  Neurological: Negative for weakness, light-headedness, numbness and headaches.  Hematological: Negative for adenopathy. Does not bruise/bleed easily.  Psychiatric/Behavioral: Negative for dysphoric mood. The patient is not nervous/anxious.         Objective:   Physical Exam  Constitutional: She appears well-developed and well-nourished. No distress.  obese and well appearing   HENT:  Head: Normocephalic and atraumatic.  Right Ear: External ear normal.  Left Ear: External ear normal.  Mouth/Throat: Oropharynx is clear and moist.  Eyes: Conjunctivae and EOM are normal. Pupils are equal, round, and reactive to light. No scleral icterus.  Neck: Normal range of motion. Neck supple. No JVD present. Carotid bruit is not present. No thyromegaly present.  Cardiovascular:  Normal rate, regular rhythm, normal heart sounds and intact distal pulses.  Exam reveals no gallop.   Pulmonary/Chest: Effort normal and breath sounds normal. No respiratory distress. She has no wheezes. She exhibits no tenderness.  Abdominal: Soft. Bowel sounds are normal. She exhibits no distension, no abdominal bruit and no mass. There is no tenderness.  Genitourinary: No breast swelling, tenderness, discharge or bleeding.  Genitourinary Comments: Breast exam: No mass, nodules, thickening, tenderness, bulging, retraction, inflamation, nipple discharge or skin changes noted.  No axillary or clavicular LA.      Musculoskeletal: Normal range of motion. She exhibits no edema or tenderness.  No kyphosis   Lymphadenopathy:    She has no cervical adenopathy.  Neurological: She is alert. She has normal reflexes. No cranial nerve deficit. She exhibits normal muscle tone. Coordination normal.  Skin: Skin is warm and dry. No rash noted. No erythema. No pallor.  Some SKs and lentigines   Psychiatric: She has a normal mood and affect.  occ tearful when disc grief over loss of mother           Assessment & Plan:   Problem List Items Addressed This Visit      Cardiovascular and Mediastinum   CEREBRAL ANEURYSM    No symptoms s/p coiling  For another arteriogram in April      Essential hypertension - Primary    bp in fair control at this time  BP Readings from Last 1 Encounters:  02/14/16 130/72   No changes needed Disc lifstyle change with low sodium diet and exercise   Labs reviewed         Endocrine   Diabetes type 2, controlled ([place])    Very well controlled with metformin and diet Wt loss enc  Lab Results  Component Value Date   HGBA1C 5.4 01/19/2016         Relevant Medications   metFORMIN (GLUCOPHAGE) 500 MG tablet   Hypothyroidism    TSH is low and dose was reduced when labs came back  Planning TSH in about a month  She has been loosing hair lately          Nervous and Auditory   Hearing loss    Pt desires further eval  Ref to audiology       Relevant Orders   Ambulatory referral to Audiology     Other   Estrogen deficiency   Relevant Orders   DG Bone Density   Hyperlipidemia    Disc goals for lipids and reasons to control them Rev labs with pt Rev low sat fat diet in  detail LDL 101- disc goal of 100 or less Continue pravastatin  Watch diet for trans/sat fats       Obesity    Discussed how this problem influences overall health and the risks it imposes  Reviewed plan for weight loss with lower calorie diet (via better food choices and also portion control or program like weight watchers) and exercise building up to or more than 30 minutes 5 days per week including some aerobic activity   Rev poss exercise that would be tolerated       Relevant Medications   metFORMIN (GLUCOPHAGE) 500 MG tablet   Visit for screening mammogram    Scheduled annual screening mammogram Nl breast exam today  Encouraged monthly self exams        Relevant Orders   MM DIGITAL SCREENING BILATERAL    Other Visit Diagnoses    Routine general medical examination at a health care facility       Need for 23-polyvalent pneumococcal polysaccharide vaccine       Relevant Orders   Pneumococcal polysaccharide vaccine 23-valent greater than or equal to 2yo subcutaneous/IM (Completed)

## 2016-02-14 NOTE — Assessment & Plan Note (Signed)
Very well controlled with metformin and diet Wt loss enc  Lab Results  Component Value Date   HGBA1C 5.4 01/19/2016

## 2016-02-14 NOTE — Progress Notes (Signed)
Pre visit review using our clinic review tool, if applicable. No additional management support is needed unless otherwise documented below in the visit note. 

## 2016-02-14 NOTE — Assessment & Plan Note (Signed)
Scheduled annual screening mammogram Nl breast exam today  Encouraged monthly self exams   

## 2016-02-14 NOTE — Assessment & Plan Note (Signed)
Disc goals for lipids and reasons to control them Rev labs with pt Rev low sat fat diet in detail LDL 101- disc goal of 100 or less Continue pravastatin  Watch diet for trans/sat fats

## 2016-02-28 ENCOUNTER — Encounter: Payer: Self-pay | Admitting: Dietician

## 2016-03-06 DIAGNOSIS — H903 Sensorineural hearing loss, bilateral: Secondary | ICD-10-CM | POA: Diagnosis not present

## 2016-03-07 DIAGNOSIS — Z9889 Other specified postprocedural states: Secondary | ICD-10-CM | POA: Diagnosis not present

## 2016-03-07 DIAGNOSIS — E119 Type 2 diabetes mellitus without complications: Secondary | ICD-10-CM | POA: Diagnosis not present

## 2016-03-07 DIAGNOSIS — H2513 Age-related nuclear cataract, bilateral: Secondary | ICD-10-CM | POA: Diagnosis not present

## 2016-03-12 ENCOUNTER — Telehealth: Payer: Self-pay | Admitting: Family Medicine

## 2016-03-12 DIAGNOSIS — I10 Essential (primary) hypertension: Secondary | ICD-10-CM

## 2016-03-12 DIAGNOSIS — E039 Hypothyroidism, unspecified: Secondary | ICD-10-CM

## 2016-03-12 NOTE — Telephone Encounter (Signed)
-----   Message from Ellamae Sia sent at 03/07/2016 10:59 AM EST ----- Regarding: Lab orders for Wednesday, 2.28.18  Lab orders for a 1 month f/u

## 2016-03-15 ENCOUNTER — Other Ambulatory Visit (INDEPENDENT_AMBULATORY_CARE_PROVIDER_SITE_OTHER): Payer: Medicare Other

## 2016-03-15 DIAGNOSIS — I10 Essential (primary) hypertension: Secondary | ICD-10-CM

## 2016-03-15 DIAGNOSIS — E039 Hypothyroidism, unspecified: Secondary | ICD-10-CM

## 2016-03-15 LAB — BASIC METABOLIC PANEL
BUN: 18 mg/dL (ref 6–23)
CO2: 30 mEq/L (ref 19–32)
Calcium: 10.1 mg/dL (ref 8.4–10.5)
Chloride: 106 mEq/L (ref 96–112)
Creatinine, Ser: 0.74 mg/dL (ref 0.40–1.20)
GFR: 83.14 mL/min (ref >60.00)
GLUCOSE: 115 mg/dL — AB (ref 70–99)
POTASSIUM: 5 meq/L (ref 3.5–5.1)
Sodium: 143 mEq/L (ref 135–145)

## 2016-03-15 LAB — TSH: TSH: 1.15 u[IU]/mL (ref 0.35–4.50)

## 2016-03-23 ENCOUNTER — Ambulatory Visit
Admission: RE | Admit: 2016-03-23 | Discharge: 2016-03-23 | Disposition: A | Discharge status: Home / Self Care | Payer: Medicare Other | Source: Ambulatory Visit | Attending: Family Medicine | Admitting: Family Medicine

## 2016-03-23 DIAGNOSIS — Z1231 Encounter for screening mammogram for malignant neoplasm of breast: Secondary | ICD-10-CM

## 2016-03-23 DIAGNOSIS — E2839 Other primary ovarian failure: Secondary | ICD-10-CM | POA: Insufficient documentation

## 2016-05-01 ENCOUNTER — Telehealth: Payer: Self-pay

## 2016-05-01 DIAGNOSIS — I671 Cerebral aneurysm, nonruptured: Secondary | ICD-10-CM

## 2016-05-01 NOTE — Telephone Encounter (Signed)
Andrea Cooper with [facility] left v/m; a lab order was faxed to Dr Glori Bickers on 04/11/16; Andrea Cooper wants to verify that Dr Glori Bickers received that order and if so can the labs be drawn at [facility] and lab results sent to [facility] neurosurgery.Andrea Cooper request cb.

## 2016-05-01 NOTE — Telephone Encounter (Signed)
Pt left v/m that mail order pharmacy let pt know that one of her meds had been denied; left v/m that pt would need to contact [place] and then cb with name of med that was denied and what pharmacy you want refill to go to.

## 2016-05-02 ENCOUNTER — Encounter: Payer: Self-pay | Admitting: Family Medicine

## 2016-05-02 MED ORDER — LEVOTHYROXINE SODIUM 150 MCG PO TABS: 150.0000 ug | ORAL_TABLET | Freq: Every day | ORAL | 1 refills | Status: DC

## 2016-05-02 NOTE — Telephone Encounter (Signed)
I don't think I have seen an order come through

## 2016-05-02 NOTE — Telephone Encounter (Signed)
Left voicemail letting Andrea Cooper know that we didn't receive lab orders and requested her to refax orders

## 2016-05-02 NOTE — Telephone Encounter (Signed)
The lab order is for bmp, cbc, pt/inr, aptt for diagnosis of cerebral aneurysm  Fax results to [phone] I will put in the order for future labs -please let her know   Also-I am not sure if insurance will cover all of this since I am not the ordering physician- I used the diagnosis they wanted me to  Thanks

## 2016-05-02 NOTE — Addendum Note (Signed)
Addended by: Loura Pardon A on: 05/02/2016 05:19 PM   Modules accepted: Orders

## 2016-05-03 NOTE — Telephone Encounter (Signed)
Left voicemail letting pt know she can come to office to get labs done for [facility] and to call us back and schedule a lab appt

## 2016-05-04 ENCOUNTER — Other Ambulatory Visit (INDEPENDENT_AMBULATORY_CARE_PROVIDER_SITE_OTHER): Payer: Medicare Other

## 2016-05-04 DIAGNOSIS — I671 Cerebral aneurysm, nonruptured: Secondary | ICD-10-CM

## 2016-05-04 LAB — BASIC METABOLIC PANEL
BUN: 24 mg/dL — ABNORMAL HIGH (ref 6–23)
CO2: 29 meq/L (ref 19–32)
CREATININE: 0.81 mg/dL (ref 0.40–1.20)
Calcium: 10 mg/dL (ref 8.4–10.5)
Chloride: 103 mEq/L (ref 96–112)
GFR: 74.88 mL/min (ref >60.00)
GLUCOSE: 93 mg/dL (ref 70–99)
Potassium: 3.5 mEq/L (ref 3.5–5.1)
Sodium: 140 mEq/L (ref 135–145)

## 2016-05-04 LAB — CBC WITH DIFFERENTIAL/PLATELET
BASOS PCT: 0.5 % (ref 0.0–3.0)
Basophils Absolute: 0 10*3/uL (ref 0.0–0.1)
Eosinophils Absolute: 0.1 10*3/uL (ref 0.0–0.7)
Eosinophils Relative: 1.6 % (ref 0.0–5.0)
HCT: 42.2 % (ref 36.0–46.0)
HEMOGLOBIN: 13.9 g/dL (ref 12.0–15.0)
LYMPHS ABS: 2.9 10*3/uL (ref 0.7–4.0)
Lymphocytes Relative: 41.8 % (ref 12.0–46.0)
MCHC: 33 g/dL (ref 30.0–36.0)
MCV: 89.7 fl (ref 78.0–100.0)
MONOS PCT: 7.6 % (ref 3.0–12.0)
Monocytes Absolute: 0.5 10*3/uL (ref 0.1–1.0)
Neutro Abs: 3.4 10*3/uL (ref 1.4–7.7)
Neutrophils Relative %: 48.5 % (ref 43.0–77.0)
Platelets: 284 10*3/uL (ref 150.0–400.0)
RBC: 4.7 Mil/uL (ref 3.87–5.11)
RDW: 14.1 % (ref 11.5–15.5)
WBC: 6.9 10*3/uL (ref 4.0–10.5)

## 2016-05-04 LAB — PROTIME-INR
INR: 1 ratio (ref 0.8–1.0)
Prothrombin Time: 10.1 s (ref 9.6–13.1)

## 2016-05-04 LAB — APTT: aPTT: 27.7 s (ref 23.4–32.7)

## 2016-05-15 DIAGNOSIS — I671 Cerebral aneurysm, nonruptured: Secondary | ICD-10-CM | POA: Diagnosis not present

## 2016-05-15 DIAGNOSIS — Z87891 Personal history of nicotine dependence: Secondary | ICD-10-CM | POA: Diagnosis not present

## 2016-05-15 DIAGNOSIS — Z7982 Long term (current) use of aspirin: Secondary | ICD-10-CM | POA: Diagnosis not present

## 2016-07-03 ENCOUNTER — Other Ambulatory Visit: Payer: Self-pay

## 2016-07-03 MED ORDER — GLUCOSE BLOOD VI STRP: ORAL_STRIP | 1 refills | Status: DC

## 2016-07-03 NOTE — Telephone Encounter (Signed)
Pt request refill onetouch ultra test strips. [facility] [address]. Last seen 02/14/16. Refilled per protocol. Pt request cb when done. Left v/m requesting pt to cb.

## 2016-08-05 ENCOUNTER — Other Ambulatory Visit: Payer: Self-pay | Admitting: Family Medicine

## 2016-08-08 NOTE — Telephone Encounter (Signed)
Pt called to see if refills were sent to [facility]. Advised the meds that were sent on 08/07/16 and pt said that was all needed.

## 2016-08-25 DIAGNOSIS — D1801 Hemangioma of skin and subcutaneous tissue: Secondary | ICD-10-CM | POA: Diagnosis not present

## 2016-08-25 DIAGNOSIS — L821 Other seborrheic keratosis: Secondary | ICD-10-CM | POA: Diagnosis not present

## 2016-08-25 DIAGNOSIS — Z85820 Personal history of malignant melanoma of skin: Secondary | ICD-10-CM | POA: Diagnosis not present

## 2016-08-25 DIAGNOSIS — D220 Melanocytic nevi of lip: Secondary | ICD-10-CM | POA: Diagnosis not present

## 2016-08-25 DIAGNOSIS — L814 Other melanin hyperpigmentation: Secondary | ICD-10-CM | POA: Diagnosis not present

## 2016-08-25 DIAGNOSIS — L858 Other specified epidermal thickening: Secondary | ICD-10-CM | POA: Diagnosis not present

## 2016-11-02 ENCOUNTER — Other Ambulatory Visit: Payer: Self-pay | Admitting: Family Medicine

## 2016-11-06 DIAGNOSIS — Z23 Encounter for immunization: Secondary | ICD-10-CM | POA: Diagnosis not present

## 2016-12-10 ENCOUNTER — Other Ambulatory Visit: Payer: Self-pay | Admitting: Family Medicine

## 2016-12-11 MED ORDER — GLUCOSE BLOOD VI STRP: ORAL_STRIP | 1 refills | Status: DC

## 2016-12-11 NOTE — Addendum Note (Signed)
Addended by: Jacqualin Combes on: 12/11/2016 04:22 PM   Modules accepted: Orders

## 2016-12-13 ENCOUNTER — Telehealth: Payer: Self-pay | Admitting: Family Medicine

## 2016-12-13 ENCOUNTER — Ambulatory Visit: Payer: Self-pay | Admitting: *Deleted

## 2016-12-13 NOTE — Telephone Encounter (Signed)
I called pt and informed her that [facility] had her test strips ready.   To ask for [facility] and she would take care of you.  She verbalized understanding and will go today and pick up her strips.

## 2016-12-13 NOTE — Telephone Encounter (Signed)
Been to [facility] 3 times to pick up her test strips and been told they were not ready.   Chart shows they were filled on 11/26.   I spoke with Mearl Latin, nurse at Dr. Marliss Coots office who contacted [place].   They test strips have been ready for 3 days.  I will call pt and make her aware.

## 2016-12-13 NOTE — Telephone Encounter (Signed)
Cindy RN with [facility] called and pt was told by [facility] [address] that did not have rx for test strips; I spoke with Theadora Rama at [address] and test strips ready for pick up for 3 days. Jenny Reichmann will let pt know.

## 2017-01-12 ENCOUNTER — Telehealth: Payer: Self-pay | Admitting: Family Medicine

## 2017-01-12 DIAGNOSIS — E039 Hypothyroidism, unspecified: Secondary | ICD-10-CM

## 2017-01-12 DIAGNOSIS — E7800 Pure hypercholesterolemia, unspecified: Secondary | ICD-10-CM

## 2017-01-12 DIAGNOSIS — I10 Essential (primary) hypertension: Secondary | ICD-10-CM

## 2017-01-12 DIAGNOSIS — E119 Type 2 diabetes mellitus without complications: Secondary | ICD-10-CM

## 2017-01-12 NOTE — Telephone Encounter (Signed)
-----   Message from Eustace Pen, LPN sent at [phone]  4:40 PM EST ----- Regarding: Labs 1/4 Lab orders needed. Thank you.  Insurance:  Medicare - traditional

## 2017-01-16 DIAGNOSIS — H269 Unspecified cataract: Secondary | ICD-10-CM

## 2017-01-16 HISTORY — DX: Unspecified cataract: H26.9

## 2017-01-19 ENCOUNTER — Ambulatory Visit (INDEPENDENT_AMBULATORY_CARE_PROVIDER_SITE_OTHER): Payer: Medicare Other

## 2017-01-19 VITALS — BP 134/84 | HR 68 | Temp 98.5°F | Ht 61.5 in | Wt 196.5 lb

## 2017-01-19 DIAGNOSIS — I10 Essential (primary) hypertension: Secondary | ICD-10-CM | POA: Diagnosis not present

## 2017-01-19 DIAGNOSIS — Z0000 Encounter for general adult medical examination without abnormal findings: Secondary | ICD-10-CM

## 2017-01-19 DIAGNOSIS — E119 Type 2 diabetes mellitus without complications: Secondary | ICD-10-CM | POA: Diagnosis not present

## 2017-01-19 DIAGNOSIS — E7800 Pure hypercholesterolemia, unspecified: Secondary | ICD-10-CM | POA: Diagnosis not present

## 2017-01-19 DIAGNOSIS — E039 Hypothyroidism, unspecified: Secondary | ICD-10-CM

## 2017-01-19 LAB — COMPREHENSIVE METABOLIC PANEL
ALK PHOS: 37 U/L — AB (ref 39–117)
ALT: 18 U/L (ref 0–35)
AST: 14 U/L (ref 0–37)
Albumin: 4.2 g/dL (ref 3.5–5.2)
BUN: 20 mg/dL (ref 6–23)
CHLORIDE: 102 meq/L (ref 96–112)
CO2: 30 mEq/L (ref 19–32)
Calcium: 9.3 mg/dL (ref 8.4–10.5)
Creatinine, Ser: 0.62 mg/dL (ref 0.40–1.20)
GFR: 101.72 mL/min (ref >60.00)
GLUCOSE: 98 mg/dL (ref 70–99)
POTASSIUM: 4.2 meq/L (ref 3.5–5.1)
Sodium: 140 mEq/L (ref 135–145)
TOTAL PROTEIN: 6.8 g/dL (ref 6.0–8.3)
Total Bilirubin: 0.5 mg/dL (ref 0.2–1.2)

## 2017-01-19 LAB — LIPID PANEL
CHOL/HDL RATIO: 3
CHOLESTEROL: 173 mg/dL (ref 0–200)
HDL: 54 mg/dL (ref >39.00)
LDL CALC: 95 mg/dL (ref 0–99)
NONHDL: 119.15
Triglycerides: 123 mg/dL (ref 0.0–149.0)
VLDL: 24.6 mg/dL (ref 0.0–40.0)

## 2017-01-19 LAB — CBC WITH DIFFERENTIAL/PLATELET
BASOS PCT: 0.7 % (ref 0.0–3.0)
Basophils Absolute: 0 10*3/uL (ref 0.0–0.1)
EOS PCT: 1.4 % (ref 0.0–5.0)
Eosinophils Absolute: 0.1 10*3/uL (ref 0.0–0.7)
HCT: 42 % (ref 36.0–46.0)
HEMOGLOBIN: 13.9 g/dL (ref 12.0–15.0)
LYMPHS ABS: 2.2 10*3/uL (ref 0.7–4.0)
Lymphocytes Relative: 34.4 % (ref 12.0–46.0)
MCHC: 33.1 g/dL (ref 30.0–36.0)
MCV: 91.4 fl (ref 78.0–100.0)
MONOS PCT: 9 % (ref 3.0–12.0)
Monocytes Absolute: 0.6 10*3/uL (ref 0.1–1.0)
Neutro Abs: 3.5 10*3/uL (ref 1.4–7.7)
Neutrophils Relative %: 54.5 % (ref 43.0–77.0)
Platelets: 241 10*3/uL (ref 150.0–400.0)
RBC: 4.59 Mil/uL (ref 3.87–5.11)
RDW: 13.8 % (ref 11.5–15.5)
WBC: 6.4 10*3/uL (ref 4.0–10.5)

## 2017-01-19 LAB — TSH: TSH: 0.24 u[IU]/mL — ABNORMAL LOW (ref 0.35–4.50)

## 2017-01-19 LAB — HEMOGLOBIN A1C: HEMOGLOBIN A1C: 5.7 % (ref 4.6–6.5)

## 2017-01-19 NOTE — Progress Notes (Signed)
Pre visit review using our clinic review tool, if applicable. No additional management support is needed unless otherwise documented below in the visit note. 

## 2017-01-19 NOTE — Progress Notes (Signed)
Subjective:   Andrea RIECKE is a 67 y.o. female who presents for Medicare Annual (Subsequent) preventive examination.  Review of Systems:  N/A Cardiac Risk Factors include: advanced age (>20men, >88 women);obesity (BMI >30kg/m2);diabetes mellitus;dyslipidemia;hypertension     Objective:     Vitals: BP 134/84 (BP Location: Left Arm, Patient Position: Sitting, Cuff Size: Normal)   Pulse 68   Temp 98.5 F (36.9 C) (Oral)   Ht 5' 1.5" (1.562 m) Comment: no shoes  Wt 196 lb 8 oz (89.1 kg)   SpO2 95%   BMI 36.53 kg/m   Body mass index is 36.53 kg/m.  Advanced Directives 01/19/2017 01/19/2016 05/24/2015  Does Patient Have a Medical Advance Directive? No Yes No  Type of Advance Directive - [place];Living will -  Copy of [place] in Chart? - No - copy requested -  Would patient like information on creating a medical advance directive? No - Patient declined - No - patient declined information    Tobacco Social History   Tobacco Use  Smoking Status Former Smoker  Smokeless Tobacco Former Systems developer  . Quit date: 06/07/2006     Counseling given: No   Clinical Intake:  Pre-visit preparation completed: Yes  Pain : No/denies pain Pain Score: 0-No pain     Nutritional Status: BMI > 30  Obese Nutritional Risks: None Diabetes: Yes CBG done?: No Did pt. bring in CBG monitor from home?: No  How often do you need to have someone help you when you read instructions, pamphlets, or other written materials from your doctor or pharmacy?: 1 - Never What is the last grade level you completed in school?: 12th grade + 2 yrs college  Interpreter Needed?: No  Comments: pt lives alone Information entered by :: LPinson, LPN  Past Medical History:  Diagnosis Date  . GERD (gastroesophageal reflux disease)   . History of anxiety   . History of cerebral aneurysm repair    history of cerebral aneurysm   . History of depression   . History of  hyperglycemia   . History of hyperlipidemia   . History of hypothyroidism   . History of skin cancer    hx of melanoma left lower arm, recurrance  . IBS (irritable bowel syndrome)   . Thyroid disease    Past Surgical History:  Procedure Laterality Date  . APPENDECTOMY  1966  . BREAST BIOPSY Left 07-29-14    core bx FIBROCYSTIC CHANGES WITH FEW MICROCALCIFICATIONS.  Marland Kitchen cardiolite  12/1997   normal EF 68%  . CEREBRAL ANEURYSM REPAIR  2008, 2009   coiling at [facility]  . EYE SURGERY     RR  . FOOT SURGERY  02/1998   bilateral  . INTRAOPERATIVE ARTERIOGRAM  10/17/2015  . [place]  . TOE SURGERY Left 2015  . TONSILLECTOMY  1964  . TOTAL ABDOMINAL HYSTERECTOMY W/ BILATERAL SALPINGOOPHORECTOMY     Family History  Problem Relation Age of Onset  . Diabetes Father   . Depression Father   . CAD Father   . Diabetes Mother   . Diverticulosis Mother   . Adrenal disorder Mother   . Breast cancer Other        distant relatives (all on mother's side)  . Cerebral aneurysm Sister    Social History   Socioeconomic History  . Marital status: Married    Spouse name: None  . Number of children: None  . Years of education: None  . Highest  education level: None  Social Needs  . Financial resource strain: None  . Food insecurity - worry: None  . Food insecurity - inability: None  . Transportation needs - medical: None  . Transportation needs - non-medical: None  Occupational History  . None  Tobacco Use  . Smoking status: Former Research scientist (life sciences)  . Smokeless tobacco: Former Systems developer    Quit date: 06/07/2006  Substance and Sexual Activity  . Alcohol use: Yes    Alcohol/week: 0.6 oz    Types: 1 Standard drinks or equivalent per week    Comment: occasional wine  . Drug use: No  . Sexual activity: No  Other Topics Concern  . None  Social History Narrative  . None    Outpatient Encounter Medications as of 01/19/2017  Medication Sig  . aspirin 325 MG tablet Take 325 mg by mouth daily.  Marland Kitchen  buPROPion (WELLBUTRIN XL) 300 MG 24 hr tablet Take 300 mg by mouth daily.  Marland Kitchen FLUoxetine (PROZAC) 40 MG capsule Take 80 mg by mouth daily.  Marland Kitchen glucose blood (ONE TOUCH ULTRA TEST) test strip CHECK BLOOD SUGAR TWICE DAILY AND AS DIRECTED Dx is E11.9  . hydrochlorothiazide (HYDRODIURIL) 25 MG tablet TAKE ONE TABLET BY MOUTH DAILY  . levothyroxine (SYNTHROID, LEVOTHROID) 150 MCG tablet TAKE ONE TABLET BY MOUTH DAILY  . lisinopril (PRINIVIL,ZESTRIL) 2.5 MG tablet TAKE ONE TABLET BY MOUTH DAILY  . LORazepam (ATIVAN) 1 MG tablet Take 1 tablet by mouth daily as needed. Reported on 05/12/2015  . metFORMIN (GLUCOPHAGE) 500 MG tablet Take 1 tablet (500 mg total) by mouth 2 (two) times daily with a meal.  . omeprazole (PRILOSEC) 20 MG capsule TAKE ONE CAPSULE BY MOUTH DAILY  . ONETOUCH DELICA LANCETS 83M MISC Check blood sugar twice a day and as directed. Dx E11.9  . potassium chloride (K-DUR,KLOR-CON) 10 MEQ tablet TAKE ONE TABLET BY MOUTH DAILY  . pravastatin (PRAVACHOL) 20 MG tablet TAKE ONE TABLET BY MOUTH DAILY  . propranolol ER (INDERAL LA) 80 MG 24 hr capsule TAKE ONE CAPSULE BY MOUTH DAILY   No facility-administered encounter medications on file as of 01/19/2017.     Activities of Daily Living In your present state of health, do you have any difficulty performing the following activities: 01/19/2017  Hearing? N  Vision? Y  Comment beginning stages of cataracts per pt report  Difficulty concentrating or making decisions? N  Walking or climbing stairs? N  Dressing or bathing? N  Doing errands, shopping? N  Preparing Food and eating ? N  Using the Toilet? N  In the past six months, have you accidently leaked urine? N  Do you have problems with loss of bowel control? N  Managing your Medications? N  Managing your Finances? N  Housekeeping or managing your Housekeeping? N  Some recent data might be hidden    Patient Care Team: Tower, Wynelle Fanny, MD as PCP - General Byrnett, Forest Gleason, MD (General  Surgery) Chucky May, MD as Consulting Physician (Psychiatry) Hoy Finlay, MD as Referring Physician (Neurosurgery) Christiane Ha as Consulting Physician (Optometry) Rolm Bookbinder, MD as Consulting Physician (Dermatology)    Assessment:   This is a routine wellness examination for Kaileigh.   Hearing Screening   125Hz  250Hz  500Hz  1000Hz  2000Hz  3000Hz  4000Hz  6000Hz  8000Hz   Right ear:   40 0 40  40    Left ear:   40 40 40  40    Vision Screening Comments: Last vision exam in Jan 2018  with Dr. Altha Harm    Exercise Activities and Dietary recommendations Current Exercise Habits: The patient does not participate in regular exercise at present, Exercise limited by: None identified  Goals    . Increase physical activity     Starting 01/22/17,I will attempt to exercise for 30 minutes 3 days per week.        Fall Risk Fall Risk  01/19/2017 01/19/2016 07/15/2015 06/17/2015 06/10/2015  Falls in the past year? No Yes (No Data) Yes Yes  Comment - - no falls since previous visit. no falls since last visit No falls since last visit 05/24/15  Number falls in past yr: - 2 or more - - -  Injury with Fall? - No - - -  Risk Factor Category  - High Fall Risk - - -  Comment - pt is being referred to PCP for further evaluation - - -   Depression Screen PHQ 2/9 Scores 01/19/2017 01/19/2016 05/24/2015 07/14/2014  PHQ - 2 Score 0 0 0 0  PHQ- 9 Score 0 - - -     Cognitive Function MMSE - Mini Mental State Exam 01/19/2017 01/19/2016  Orientation to time 5 5  Orientation to Place 5 5  Registration 3 3  Attention/ Calculation 0 0  Recall 3 3  Language- name 2 objects 0 0  Language- repeat 1 1  Language- follow 3 step command 3 3  Language- read & follow direction 0 0  Write a sentence 0 0  Copy design 0 0  Total score 20 20       PLEASE NOTE: A Mini-Cog screen was completed. Maximum score is 20. A value of 0 denotes this part of [place] MMSE was not completed or the patient failed this part  of the Mini-Cog screening.   Mini-Cog Screening Orientation to Time - Max 5 pts Orientation to Place - Max 5 pts Registration - Max 3 pts Recall - Max 3 pts Language Repeat - Max 1 pts Language Follow 3 Step Command - Max 3 pts   Immunization History  Administered Date(s) Administered  . Influenza Whole 01/13/2009  . Influenza, High Dose Seasonal PF 11/06/2016  . Influenza,inj,Quad PF,6+ Mos 10/07/2013, 12/21/2015  . Pneumococcal Conjugate-13 07/14/2014  . Pneumococcal Polysaccharide-23 10/01/2008, 02/14/2016  . Td 01/08/1997, 04/16/2005  . Zoster 12/15/2013    Screening Tests Health Maintenance  Topic Date Due  . FOOT EXAM  01/26/2017 (Originally 08/12/2016)  . TETANUS/TDAP  04/15/2025 (Originally 04/17/2015)  . OPHTHALMOLOGY EXAM  01/30/2017  . MAMMOGRAM  03/23/2017  . HEMOGLOBIN A1C  07/19/2017  . COLONOSCOPY  08/23/2023  . INFLUENZA VACCINE  Completed  . DEXA SCAN  Completed  . Hepatitis C Screening  Completed  . PNA vac Low Risk Adult  Completed      Plan:     I have personally reviewed, addressed, and noted the following in the patient's chart:  A. Medical and social history B. Use of alcohol, tobacco or illicit drugs  C. Current medications and supplements D. Functional ability and status E.  Nutritional status F.  Physical activity G. Advance directives H. List of other physicians I.  Hospitalizations, surgeries, and ER visits in previous 12 months J.  [place] to include hearing, vision, cognitive, depression L. Referrals and appointments - none  In addition, I have reviewed and discussed with patient certain preventive protocols, quality metrics, and best practice recommendations. A written personalized care plan for preventive services as well as general preventive health recommendations were provided  to patient.  See attached scanned questionnaire for additional information.   Signed,   Lindell Noe, MHA, BS, LPN Health Coach

## 2017-01-19 NOTE — Progress Notes (Signed)
PCP notes:   Health maintenance:  Foot exam - PCP please address at next appt Eye exam - addressed; future appt scheduled A1C - completed  Abnormal screenings:   Hearing - failed  Hearing Screening   125Hz  250Hz  500Hz  1000Hz  2000Hz  3000Hz  4000Hz  6000Hz  8000Hz   Right ear:   40 0 40  40    Left ear:   40 40 40  40     Patient concerns:   None  Nurse concerns:  None  Next PCP appt:   01/26/2017 @ 0830  I reviewed health advisor's note, was available for consultation, and agree with documentation and plan. Loura Pardon MD

## 2017-01-19 NOTE — Patient Instructions (Addendum)
Andrea Cooper , Thank you for taking time to come for your Medicare Wellness Visit. I appreciate your ongoing commitment to your health goals. Please review the following plan we discussed and let me know if I can assist you in the future.   These are the goals we discussed: Goals    . Increase physical activity     Starting 01/22/17,I will attempt to exercise for 30 minutes 3 days per week.        This is a list of the screening recommended for you and due dates:  Health Maintenance  Topic Date Due  . Complete foot exam   01/26/2017*  . Tetanus Vaccine  04/15/2025*  . Eye exam for diabetics  01/30/2017  . Mammogram  03/23/2017  . Hemoglobin A1C  07/19/2017  . Colon Cancer Screening  08/23/2023  . Flu Shot  Completed  . DEXA scan (bone density measurement)  Completed  .  Hepatitis C: One time screening is recommended by Center for Disease Control  (CDC) for  adults born from [birth year] through 1967.   Completed  . Pneumonia vaccines  Completed  *Topic was postponed. The date shown is not the original due date.   Preventive Care for Adults  A healthy lifestyle and preventive care can promote health and wellness. Preventive health guidelines for adults include the following key practices.  . A routine yearly physical is a good way to check with your health care provider about your health and preventive screening. It is a chance to share any concerns and updates on your health and to receive a thorough exam.  . Visit your dentist for a routine exam and preventive care every 6 months. Brush your teeth twice a day and floss once a day. Good oral hygiene prevents tooth decay and gum disease.  . The frequency of eye exams is based on your age, health, family medical history, use  of contact lenses, and other factors. Follow your health care provider's recommendations for frequency of eye exams.  . Eat a healthy diet. Foods like vegetables, fruits, whole grains, low-fat dairy products, and lean  protein foods contain the nutrients you need without too many calories. Decrease your intake of foods high in solid fats, added sugars, and salt. Eat the right amount of calories for you. Get information about a proper diet from your health care provider, if necessary.  . Regular physical exercise is one of the most important things you can do for your health. Most adults should get at least 150 minutes of moderate-intensity exercise (any activity that increases your heart rate and causes you to sweat) each week. In addition, most adults need muscle-strengthening exercises on 2 or more days a week.  Silver Sneakers may be a benefit available to you. To determine eligibility, you may visit the website: www.silversneakers.com or contact program at 9388555540 Mon-Fri between 8AM-8PM.   . Maintain a healthy weight. The body mass index (BMI) is a screening tool to identify possible weight problems. It provides an estimate of body fat based on height and weight. Your health care provider can find your BMI and can help you achieve or maintain a healthy weight.   For adults 20 years and older: ? A BMI below 18.5 is considered underweight. ? A BMI of 18.5 to 24.9 is normal. ? A BMI of 25 to 29.9 is considered overweight. ? A BMI of 30 and above is considered obese.   . Maintain normal blood lipids and cholesterol levels by exercising  and minimizing your intake of saturated fat. Eat a balanced diet with plenty of fruit and vegetables. Blood tests for lipids and cholesterol should begin at age 13 and be repeated every 5 years. If your lipid or cholesterol levels are high, you are over 50, or you are at high risk for heart disease, you may need your cholesterol levels checked more frequently. Ongoing high lipid and cholesterol levels should be treated with medicines if diet and exercise are not working.  . If you smoke, find out from your health care provider how to quit. If you do not use tobacco, please  do not start.  . If you choose to drink alcohol, please do not consume more than 2 drinks per day. One drink is considered to be 12 ounces (355 mL) of beer, 5 ounces (148 mL) of wine, or 1.5 ounces (44 mL) of liquor.  . If you are 58-66 years old, ask your health care provider if you should take aspirin to prevent strokes.  . Use sunscreen. Apply sunscreen liberally and repeatedly throughout the day. You should seek shade when your shadow is shorter than you. Protect yourself by wearing long sleeves, pants, a wide-brimmed hat, and sunglasses year round, whenever you are outdoors.  . Once a month, do a whole body skin exam, using a mirror to look at the skin on your back. Tell your health care provider of new moles, moles that have irregular borders, moles that are larger than a pencil eraser, or moles that have changed in shape or color.

## 2017-01-26 ENCOUNTER — Encounter: Payer: Self-pay | Admitting: Family Medicine

## 2017-01-26 ENCOUNTER — Ambulatory Visit (INDEPENDENT_AMBULATORY_CARE_PROVIDER_SITE_OTHER): Payer: Medicare Other | Admitting: Family Medicine

## 2017-01-26 VITALS — BP 128/76 | HR 70 | Temp 97.9°F | Ht 61.5 in | Wt 198.2 lb

## 2017-01-26 DIAGNOSIS — E039 Hypothyroidism, unspecified: Secondary | ICD-10-CM

## 2017-01-26 DIAGNOSIS — I10 Essential (primary) hypertension: Secondary | ICD-10-CM

## 2017-01-26 DIAGNOSIS — E119 Type 2 diabetes mellitus without complications: Secondary | ICD-10-CM | POA: Diagnosis not present

## 2017-01-26 DIAGNOSIS — E6609 Other obesity due to excess calories: Secondary | ICD-10-CM

## 2017-01-26 DIAGNOSIS — R1310 Dysphagia, unspecified: Secondary | ICD-10-CM | POA: Diagnosis not present

## 2017-01-26 DIAGNOSIS — Z6832 Body mass index (BMI) 32.0-32.9, adult: Secondary | ICD-10-CM | POA: Diagnosis not present

## 2017-01-26 DIAGNOSIS — E7800 Pure hypercholesterolemia, unspecified: Secondary | ICD-10-CM | POA: Diagnosis not present

## 2017-01-26 DIAGNOSIS — R1319 Other dysphagia: Secondary | ICD-10-CM

## 2017-01-26 MED ORDER — POTASSIUM CHLORIDE CRYS ER 10 MEQ PO TBCR: 10.0000 meq | EXTENDED_RELEASE_TABLET | Freq: Every day | ORAL | 3 refills | Status: DC

## 2017-01-26 MED ORDER — LISINOPRIL 2.5 MG PO TABS: 2.5000 mg | ORAL_TABLET | Freq: Every day | ORAL | 3 refills | Status: DC

## 2017-01-26 MED ORDER — LEVOTHYROXINE SODIUM 125 MCG PO TABS: 125.0000 ug | ORAL_TABLET | Freq: Every day | ORAL | 3 refills | Status: DC

## 2017-01-26 MED ORDER — OMEPRAZOLE 20 MG PO CPDR: 20.0000 mg | DELAYED_RELEASE_CAPSULE | Freq: Every day | ORAL | 3 refills | Status: DC

## 2017-01-26 MED ORDER — PROPRANOLOL HCL ER 80 MG PO CP24: 80.0000 mg | ORAL_CAPSULE | Freq: Every day | ORAL | 3 refills | Status: DC

## 2017-01-26 MED ORDER — METFORMIN HCL 500 MG PO TABS: 500.0000 mg | ORAL_TABLET | Freq: Two times a day (BID) | ORAL | 3 refills | Status: DC

## 2017-01-26 MED ORDER — HYDROCHLOROTHIAZIDE 25 MG PO TABS: 25.0000 mg | ORAL_TABLET | Freq: Every day | ORAL | 3 refills | Status: DC

## 2017-01-26 MED ORDER — PRAVASTATIN SODIUM 20 MG PO TABS: 20.0000 mg | ORAL_TABLET | Freq: Every day | ORAL | 3 refills | Status: DC
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
## 2017-01-26 NOTE — Patient Instructions (Addendum)
If the chest symptoms/ swallowing do not improve please let us know  Stick with softer foods  Eat small bites and drink your fluids also   For weight loss Try to get most of your carbohydrates from produce (with the exception of white potatoes)  Eat less bread/pasta/rice/snack foods/cereals/sweets and other items from the middle of the grocery store (processed carbs) For fats - stick to the healthier ones   Check with pharmacy about tetanus shot    Decrease levothyroxine to 125 mcg  We will re check that in 6 weeks

## 2017-01-28 NOTE — Assessment & Plan Note (Signed)
Lab Results  Component Value Date   HGBA1C 5.7 01/19/2017   Good control  Eye exam in set up  Disc foot care On ace  Low glycemic diet recommended-also for wt loss

## 2017-01-28 NOTE — Assessment & Plan Note (Signed)
After choking while eating- traumatic  Still some soreness but not signs of bleeding  Recommend soft foods/ small bites in the near future and continue PPI Ref to GI if no imp in the next 1-2 weeks or any worse

## 2017-01-28 NOTE — Assessment & Plan Note (Signed)
Discussed how this problem influences overall health and the risks it imposes  Reviewed plan for weight loss with lower calorie diet (via better food choices and also portion control or program like weight watchers) and exercise building up to or more than 30 minutes 5 days per week including some aerobic activity    

## 2017-01-28 NOTE — Assessment & Plan Note (Signed)
Lab Results  Component Value Date   TSH 0.24 (L) 01/19/2017   no clinical changes  Lower levothy dose to 125 mcg Re check in 6 weeks

## 2017-01-28 NOTE — Assessment & Plan Note (Signed)
bp in fair control at this time  BP Readings from Last 1 Encounters:  01/26/17 128/76   No changes needed Disc lifstyle change with low sodium diet and exercise  Labs reviewed

## 2017-01-28 NOTE — Assessment & Plan Note (Signed)
Disc goals for lipids and reasons to control them Rev labs with pt Rev low sat fat diet in detail  conitnue pravastatin and diet

## 2017-03-09 ENCOUNTER — Telehealth: Payer: Self-pay | Admitting: *Deleted

## 2017-03-09 ENCOUNTER — Other Ambulatory Visit (INDEPENDENT_AMBULATORY_CARE_PROVIDER_SITE_OTHER): Payer: Medicare Other

## 2017-03-09 DIAGNOSIS — E039 Hypothyroidism, unspecified: Secondary | ICD-10-CM

## 2017-03-09 LAB — TSH: TSH: 6.23 u[IU]/mL — ABNORMAL HIGH (ref 0.35–4.50)

## 2017-03-09 NOTE — Telephone Encounter (Signed)
Left VM requesting pt to call the office back CRM created 

## 2017-03-09 NOTE — Telephone Encounter (Signed)
Hold the lisinopril and let us know in 1-2 weeks if it goes away- it could be a side effect Thanks

## 2017-03-09 NOTE — Telephone Encounter (Signed)
Pt left a note at the front desk saying "I have a tickle in the back of my throat, it started about the time I started the last med for DM"  Please advise

## 2017-03-12 NOTE — Telephone Encounter (Signed)
Pt returning call to [place]. Please advise.

## 2017-03-12 NOTE — Telephone Encounter (Signed)
Pt notified of Dr. Tower's comments and verbalized understanding  

## 2017-03-13 DIAGNOSIS — Z9889 Other specified postprocedural states: Secondary | ICD-10-CM | POA: Diagnosis not present

## 2017-03-13 DIAGNOSIS — E119 Type 2 diabetes mellitus without complications: Secondary | ICD-10-CM | POA: Diagnosis not present

## 2017-03-13 DIAGNOSIS — H2513 Age-related nuclear cataract, bilateral: Secondary | ICD-10-CM | POA: Diagnosis not present

## 2017-03-13 NOTE — Addendum Note (Signed)
Addended by: Tammi Sou on: 03/13/2017 04:41 PM   Modules accepted: Orders

## 2017-03-22 ENCOUNTER — Other Ambulatory Visit: Payer: Self-pay | Admitting: Family Medicine

## 2017-03-22 ENCOUNTER — Encounter: Payer: Self-pay | Admitting: Family Medicine

## 2017-03-22 NOTE — Telephone Encounter (Signed)
Spoke with pt regarding her thyroid labs as well (routed to Dr. Glori Bickers) and she didn't want me to send in new dose of thyroid until she got a response back from Dr. Glori Bickers, pt has been taking biotin for over a month and she ?? If that is the cause of her TSH level being off, she didn't know if you want to change dose now or just have her stop the biotin and then recheck labs, please advise

## 2017-03-22 NOTE — Progress Notes (Signed)
Removed lisinopril and listed side eff

## 2017-03-29 DIAGNOSIS — H25813 Combined forms of age-related cataract, bilateral: Secondary | ICD-10-CM | POA: Diagnosis not present

## 2017-04-06 ENCOUNTER — Other Ambulatory Visit: Payer: Self-pay | Admitting: Family Medicine

## 2017-04-06 DIAGNOSIS — Z1231 Encounter for screening mammogram for malignant neoplasm of breast: Secondary | ICD-10-CM

## 2017-04-16 HISTORY — PX: CATARACT EXTRACTION W/ INTRAOCULAR LENS  IMPLANT, BILATERAL: SHX1307

## 2017-04-19 ENCOUNTER — Telehealth: Payer: Self-pay | Admitting: *Deleted

## 2017-04-19 ENCOUNTER — Other Ambulatory Visit (INDEPENDENT_AMBULATORY_CARE_PROVIDER_SITE_OTHER): Payer: Medicare Other

## 2017-04-19 DIAGNOSIS — E039 Hypothyroidism, unspecified: Secondary | ICD-10-CM | POA: Diagnosis not present

## 2017-04-19 LAB — TSH: TSH: 8.11 u[IU]/mL — AB (ref 0.35–4.50)

## 2017-04-19 MED ORDER — LEVOTHYROXINE SODIUM 137 MCG PO TABS: 137.0000 ug | ORAL_TABLET | Freq: Every day | ORAL | 1 refills | Status: DC

## 2017-04-19 NOTE — Telephone Encounter (Addendum)
Patient stated her preferred pharmacy is  [facility] [address], [place] - [place] [phone] (Phone) [phone] (Fax)

## 2017-04-19 NOTE — Telephone Encounter (Signed)
Attempted to contact pt with results / instructions per Dr Glori Bickers and verify pharmacy; unable to leave message at [phone].

## 2017-04-19 NOTE — Telephone Encounter (Signed)
-----   Message from Abner Greenspan, MD sent at 04/19/2017 12:45 PM EDT ----- TSH is still high -so not just the biotin causing ig  Do increase her levothyroxine as we were planning previously- and then re check TSH in 6 weeks  Thanks

## 2017-04-19 NOTE — Telephone Encounter (Addendum)
Left VM requesting pt to call office back, will pend Rx until can confirm her pharmacy and also lab results sent to [facility] nurse and CRM created

## 2017-04-19 NOTE — Addendum Note (Signed)
Addended by: Tammi Sou on: 04/19/2017 04:41 PM   Modules accepted: Orders

## 2017-04-19 NOTE — Telephone Encounter (Signed)
Attempted to contact pt with results / instructions per Dr Glori Bickers and to verify pharmacy; left message on voice mail [phone].

## 2017-04-20 ENCOUNTER — Ambulatory Visit
Admission: RE | Admit: 2017-04-20 | Discharge: 2017-04-20 | Disposition: A | Discharge status: Home / Self Care | Payer: Medicare Other | Source: Ambulatory Visit | Attending: Family Medicine | Admitting: Family Medicine

## 2017-04-20 DIAGNOSIS — Z1231 Encounter for screening mammogram for malignant neoplasm of breast: Secondary | ICD-10-CM | POA: Diagnosis not present

## 2017-04-20 NOTE — Telephone Encounter (Signed)
See lab results. Rx was already sent to [place]

## 2017-04-23 DIAGNOSIS — I671 Cerebral aneurysm, nonruptured: Secondary | ICD-10-CM | POA: Diagnosis not present

## 2017-04-23 DIAGNOSIS — I10 Essential (primary) hypertension: Secondary | ICD-10-CM | POA: Diagnosis not present

## 2017-04-23 DIAGNOSIS — F419 Anxiety disorder, unspecified: Secondary | ICD-10-CM | POA: Diagnosis not present

## 2017-04-23 DIAGNOSIS — K219 Gastro-esophageal reflux disease without esophagitis: Secondary | ICD-10-CM | POA: Diagnosis not present

## 2017-04-23 DIAGNOSIS — E039 Hypothyroidism, unspecified: Secondary | ICD-10-CM | POA: Diagnosis not present

## 2017-04-23 DIAGNOSIS — E119 Type 2 diabetes mellitus without complications: Secondary | ICD-10-CM | POA: Diagnosis not present

## 2017-04-23 DIAGNOSIS — E668 Other obesity: Secondary | ICD-10-CM | POA: Diagnosis not present

## 2017-04-23 DIAGNOSIS — R51 Headache: Secondary | ICD-10-CM | POA: Diagnosis not present

## 2017-04-23 DIAGNOSIS — Z87891 Personal history of nicotine dependence: Secondary | ICD-10-CM | POA: Diagnosis not present

## 2017-04-23 DIAGNOSIS — Z7984 Long term (current) use of oral hypoglycemic drugs: Secondary | ICD-10-CM | POA: Diagnosis not present

## 2017-04-23 DIAGNOSIS — Z9889 Other specified postprocedural states: Secondary | ICD-10-CM | POA: Diagnosis not present

## 2017-04-23 DIAGNOSIS — Z85828 Personal history of other malignant neoplasm of skin: Secondary | ICD-10-CM | POA: Diagnosis not present

## 2017-04-23 DIAGNOSIS — K589 Irritable bowel syndrome without diarrhea: Secondary | ICD-10-CM | POA: Diagnosis not present

## 2017-04-23 DIAGNOSIS — H52221 Regular astigmatism, right eye: Secondary | ICD-10-CM | POA: Diagnosis not present

## 2017-04-23 DIAGNOSIS — E1151 Type 2 diabetes mellitus with diabetic peripheral angiopathy without gangrene: Secondary | ICD-10-CM | POA: Diagnosis not present

## 2017-04-23 DIAGNOSIS — F329 Major depressive disorder, single episode, unspecified: Secondary | ICD-10-CM | POA: Diagnosis not present

## 2017-04-23 DIAGNOSIS — H2511 Age-related nuclear cataract, right eye: Secondary | ICD-10-CM | POA: Diagnosis not present

## 2017-04-24 ENCOUNTER — Other Ambulatory Visit: Payer: Medicare Other

## 2017-05-07 DIAGNOSIS — Z9841 Cataract extraction status, right eye: Secondary | ICD-10-CM | POA: Diagnosis not present

## 2017-05-07 DIAGNOSIS — K589 Irritable bowel syndrome without diarrhea: Secondary | ICD-10-CM | POA: Diagnosis not present

## 2017-05-07 DIAGNOSIS — E668 Other obesity: Secondary | ICD-10-CM | POA: Diagnosis not present

## 2017-05-07 DIAGNOSIS — E7800 Pure hypercholesterolemia, unspecified: Secondary | ICD-10-CM | POA: Diagnosis not present

## 2017-05-07 DIAGNOSIS — F419 Anxiety disorder, unspecified: Secondary | ICD-10-CM | POA: Diagnosis not present

## 2017-05-07 DIAGNOSIS — H52222 Regular astigmatism, left eye: Secondary | ICD-10-CM | POA: Diagnosis not present

## 2017-05-07 DIAGNOSIS — Z85828 Personal history of other malignant neoplasm of skin: Secondary | ICD-10-CM | POA: Diagnosis not present

## 2017-05-07 DIAGNOSIS — Z7984 Long term (current) use of oral hypoglycemic drugs: Secondary | ICD-10-CM | POA: Diagnosis not present

## 2017-05-07 DIAGNOSIS — H2512 Age-related nuclear cataract, left eye: Secondary | ICD-10-CM | POA: Diagnosis not present

## 2017-05-07 DIAGNOSIS — E039 Hypothyroidism, unspecified: Secondary | ICD-10-CM | POA: Diagnosis not present

## 2017-05-07 DIAGNOSIS — Z87891 Personal history of nicotine dependence: Secondary | ICD-10-CM | POA: Diagnosis not present

## 2017-05-07 DIAGNOSIS — G4730 Sleep apnea, unspecified: Secondary | ICD-10-CM | POA: Diagnosis not present

## 2017-05-07 DIAGNOSIS — E1151 Type 2 diabetes mellitus with diabetic peripheral angiopathy without gangrene: Secondary | ICD-10-CM | POA: Diagnosis not present

## 2017-05-07 DIAGNOSIS — E119 Type 2 diabetes mellitus without complications: Secondary | ICD-10-CM | POA: Diagnosis not present

## 2017-05-07 DIAGNOSIS — K219 Gastro-esophageal reflux disease without esophagitis: Secondary | ICD-10-CM | POA: Diagnosis not present

## 2017-05-07 DIAGNOSIS — F329 Major depressive disorder, single episode, unspecified: Secondary | ICD-10-CM | POA: Diagnosis not present

## 2017-05-07 DIAGNOSIS — I10 Essential (primary) hypertension: Secondary | ICD-10-CM | POA: Diagnosis not present

## 2017-05-07 DIAGNOSIS — Z961 Presence of intraocular lens: Secondary | ICD-10-CM | POA: Diagnosis not present

## 2017-05-14 DIAGNOSIS — I671 Cerebral aneurysm, nonruptured: Secondary | ICD-10-CM | POA: Diagnosis not present

## 2017-05-28 ENCOUNTER — Telehealth: Payer: Self-pay | Admitting: Family Medicine

## 2017-05-28 DIAGNOSIS — E039 Hypothyroidism, unspecified: Secondary | ICD-10-CM

## 2017-05-28 DIAGNOSIS — E119 Type 2 diabetes mellitus without complications: Secondary | ICD-10-CM

## 2017-05-28 NOTE — Telephone Encounter (Signed)
-----   Message from Lendon Collar, RT sent at 05/23/2017  5:20 PM EDT ----- Regarding: Lab orders for 06/01/17 Please enter lab orders for Friday 06/01/17. Thanks-Lauren

## 2017-06-01 ENCOUNTER — Other Ambulatory Visit (INDEPENDENT_AMBULATORY_CARE_PROVIDER_SITE_OTHER): Payer: Medicare Other

## 2017-06-01 ENCOUNTER — Ambulatory Visit (INDEPENDENT_AMBULATORY_CARE_PROVIDER_SITE_OTHER): Payer: Medicare Other | Admitting: Family Medicine

## 2017-06-01 ENCOUNTER — Encounter: Payer: Self-pay | Admitting: Family Medicine

## 2017-06-01 VITALS — BP 132/78 | HR 76 | Temp 98.3°F | Ht 61.5 in | Wt 205.8 lb

## 2017-06-01 DIAGNOSIS — I10 Essential (primary) hypertension: Secondary | ICD-10-CM

## 2017-06-01 DIAGNOSIS — E039 Hypothyroidism, unspecified: Secondary | ICD-10-CM

## 2017-06-01 DIAGNOSIS — E119 Type 2 diabetes mellitus without complications: Secondary | ICD-10-CM

## 2017-06-01 DIAGNOSIS — Z6838 Body mass index (BMI) 38.0-38.9, adult: Secondary | ICD-10-CM | POA: Diagnosis not present

## 2017-06-01 DIAGNOSIS — F418 Other specified anxiety disorders: Secondary | ICD-10-CM

## 2017-06-01 LAB — HEMOGLOBIN A1C: HEMOGLOBIN A1C: 5.9 % (ref 4.6–6.5)

## 2017-06-01 LAB — TSH: TSH: 1.08 u[IU]/mL (ref 0.35–4.50)

## 2017-06-01 NOTE — Progress Notes (Signed)
Subjective:    Patient ID: Andrea Cooper, female    DOB: 12/10/1950, 67 y.o.   MRN: [id number]  HPI Here for f/u of chronic medical problems  Doing well  Spending time with her grandkids  Also feeling good   Taking care of herself    Wt Readings from Last 3 Encounters:  06/01/17 205 lb 12 oz (93.3 kg)  01/26/17 198 lb 4 oz (89.9 kg)  01/19/17 196 lb 8 oz (89.1 kg)  not eating like she should -wants to loose weight  Plans to start control of portions  38.25 kg/m   bp is stable today  No cp or palpitations or headaches or edema  No side effects to medicines  BP Readings from Last 3 Encounters:  06/01/17 132/78  01/26/17 128/76  01/19/17 134/84     DM2 Lab Results  Component Value Date   HGBA1C 5.7 01/19/2017  last eye exam  Does not expect A1C to be as good  Plans to change diet  Most of her glucose readings are in 130s  Lab Results  Component Value Date   MICROALBUR 1.8 07/09/2008     Hypothyroid  Lab Results  Component Value Date   TSH 8.11 (H) 04/19/2017   was up after lowering dose (prev 0.24)  Feeling better on current dose -more energy than she had previously  Hair is getting better soon    Had lab this am for A1C and tsh   Dr Lajoyce Corners px wellbutrin and prozac and ativan (as needed) - does not take it often  Has been very stable with mental health since she lost some stressors  Really doing well  She is ready to take the ativan off the list  She cannot afford to see Dr Lajoyce Corners  She no longer has stress  Wants me to take over medicines    Patient Active Problem List   Diagnosis Date Noted  . Dysphagia 01/26/2017  . Hearing loss 02/14/2016  . Estrogen deficiency 02/14/2016  . GERD (gastroesophageal reflux disease) 08/13/2015  . Fat necrosis (segmental) of breast 08/27/2014  . Visit for screening mammogram 07/25/2014  . Encounter for Medicare annual wellness exam 07/14/2014  . Hypokalemia 12/15/2013  . Other screening mammogram 06/07/2011  .  SLEEP APNEA 09/13/2009  . Obesity 01/13/2009  . POSTMENOPAUSAL STATUS 10/01/2008  . BACK PAIN, LUMBAR, WITH RADICULOPATHY 08/10/2008  . SWEATING 07/15/2008  . Diabetes type 2, controlled ([place]) 05/22/2007  . TOBACCO USE, QUIT 05/22/2007  . CEREBRAL ANEURYSM 08/09/2006  . Hypothyroidism 07/25/2006  . Hyperlipidemia 07/25/2006  . ANXIETY 07/25/2006  . Depression with anxiety 07/25/2006  . Essential hypertension 07/25/2006  . IBS 07/25/2006  . OVEREATING 07/25/2006  . SKIN CANCER, HX OF 07/25/2006   Past Medical History:  Diagnosis Date  . GERD (gastroesophageal reflux disease)   . History of anxiety   . History of cerebral aneurysm repair    history of cerebral aneurysm   . History of depression   . History of hyperglycemia   . History of hyperlipidemia   . History of hypothyroidism   . History of skin cancer    hx of melanoma left lower arm, recurrance  . IBS (irritable bowel syndrome)   . Thyroid disease    Past Surgical History:  Procedure Laterality Date  . APPENDECTOMY  1966  . BREAST BIOPSY Left 07-29-14    core bx FIBROCYSTIC CHANGES WITH FEW MICROCALCIFICATIONS.  Marland Kitchen cardiolite  12/1997   normal EF 68%  . CEREBRAL  ANEURYSM REPAIR  2008, 2009   coiling at [facility]  . EYE SURGERY     RR  . FOOT SURGERY  02/1998   bilateral  . INTRAOPERATIVE ARTERIOGRAM  10/17/2015  . [place]  . TOE SURGERY Left 2015  . TONSILLECTOMY  1964  . TOTAL ABDOMINAL HYSTERECTOMY W/ BILATERAL SALPINGOOPHORECTOMY     Social History   Tobacco Use  . Smoking status: Former Research scientist (life sciences)  . Smokeless tobacco: Former Systems developer    Quit date: 06/07/2006  Substance Use Topics  . Alcohol use: Yes    Alcohol/week: 0.6 oz    Types: 1 Standard drinks or equivalent per week    Comment: occasional wine  . Drug use: No   Family History  Problem Relation Age of Onset  . Diabetes Father   . Depression Father   . CAD Father   . Diabetes Mother   . Diverticulosis Mother   . Adrenal disorder Mother    . Breast cancer Other        distant relatives (all on mother's side)  . Cerebral aneurysm Sister    Allergies  Allergen Reactions  . Codeine Nausea And Vomiting    REACTION: nausea and vomiting  . Lisinopril     Cough/throat symptoms   . Latex Rash   Current Outpatient Medications on File Prior to Visit  Medication Sig Dispense Refill  . aspirin EC 81 MG tablet Take 81 mg by mouth daily.    Marland Kitchen buPROPion (WELLBUTRIN XL) 300 MG 24 hr tablet Take 300 mg by mouth daily.    Marland Kitchen FLUoxetine (PROZAC) 40 MG capsule Take 80 mg by mouth daily.    Marland Kitchen glucose blood (ONE TOUCH ULTRA TEST) test strip CHECK BLOOD SUGAR TWICE DAILY AND AS DIRECTED Dx is E11.9 200 each 1  . hydrochlorothiazide (HYDRODIURIL) 25 MG tablet Take 1 tablet (25 mg total) by mouth daily. 90 tablet 3  . levothyroxine (SYNTHROID, LEVOTHROID) 137 MCG tablet Take 1 tablet (137 mcg total) by mouth daily before breakfast. 30 tablet 1  . metFORMIN (GLUCOPHAGE) 500 MG tablet Take 1 tablet (500 mg total) by mouth 2 (two) times daily with a meal. 180 tablet 3  . omeprazole (PRILOSEC) 20 MG capsule Take 1 capsule (20 mg total) by mouth daily. 90 capsule 3  . ONETOUCH DELICA LANCETS 70Y MISC Check blood sugar twice a day and as directed. Dx E11.9 200 each 1  . potassium chloride (K-DUR,KLOR-CON) 10 MEQ tablet Take 1 tablet (10 mEq total) by mouth daily. 90 tablet 3  . pravastatin (PRAVACHOL) 20 MG tablet Take 1 tablet (20 mg total) by mouth daily. 90 tablet 3  . propranolol ER (INDERAL LA) 80 MG 24 hr capsule Take 1 capsule (80 mg total) by mouth daily. 90 capsule 3   No current facility-administered medications on file prior to visit.     Review of Systems  Constitutional: Negative for activity change, appetite change, fatigue, fever and unexpected weight change.  HENT: Negative for congestion, ear pain, rhinorrhea, sinus pressure and sore throat.   Eyes: Negative for pain, redness and visual disturbance.  Respiratory: Negative for  cough, shortness of breath and wheezing.   Cardiovascular: Negative for chest pain and palpitations.  Gastrointestinal: Negative for abdominal pain, blood in stool, constipation and diarrhea.  Endocrine: Negative for polydipsia and polyuria.  Genitourinary: Negative for dysuria, frequency and urgency.  Musculoskeletal: Negative for arthralgias, back pain and myalgias.  Skin: Negative for pallor and rash.  Allergic/Immunologic: Negative for  environmental allergies.  Neurological: Negative for dizziness, syncope and headaches.  Hematological: Negative for adenopathy. Does not bruise/bleed easily.  Psychiatric/Behavioral: Negative for decreased concentration and dysphoric mood. The patient is not nervous/anxious.        Objective:   Physical Exam  Constitutional: She appears well-developed and well-nourished. No distress.  obese and well appearing   HENT:  Head: Normocephalic and atraumatic.  Mouth/Throat: Oropharynx is clear and moist.  Eyes: Pupils are equal, round, and reactive to light. Conjunctivae and EOM are normal.  Neck: Normal range of motion. Neck supple. No JVD present. Carotid bruit is not present. No thyromegaly present.  Cardiovascular: Normal rate, regular rhythm, normal heart sounds and intact distal pulses. Exam reveals no gallop.  Pulmonary/Chest: Effort normal and breath sounds normal. No respiratory distress. She has no wheezes. She has no rales.  No crackles  Abdominal: Soft. Bowel sounds are normal. She exhibits no distension, no abdominal bruit and no mass. There is no tenderness.  Musculoskeletal: She exhibits no edema.  Lymphadenopathy:    She has no cervical adenopathy.  Neurological: She is alert. She has normal reflexes.  Skin: Skin is warm and dry. No rash noted.  Psychiatric: She has a normal mood and affect. Her behavior is normal. Judgment and thought content normal.  Pleasant  Mood is good           Assessment & Plan:   Problem List Items  Addressed This Visit      Cardiovascular and Mediastinum   Essential hypertension    bp in fair control at this time  BP Readings from Last 1 Encounters:  06/01/17 132/78   No changes needed Most recent labs reviewed  Disc lifstyle change with low sodium diet and exercise  Labs rev         Endocrine   Diabetes type 2, controlled ([place]) - Primary    A1C today  Health habits not as good  Expects it to be up  disc imp of low glycemic diet and wt loss  Continue metformin        Hypothyroidism    TSH today s/p dose change  Clinical improvement-more energy        Other   Depression with anxiety    Pt cannot afford copay at psychiatrist  Stable Less stress Stopped ativan  Continue wellbutrin and prozac  I will take these over  Reviewed stressors/ coping techniques/symptoms/ support sources/ tx options and side effects in detail today Disc imp of self care and addition of exercise       Obesity    Discussed how this problem influences overall health and the risks it imposes  Reviewed plan for weight loss with lower calorie diet (via better food choices and also portion control or program like weight watchers) and exercise building up to or more than 30 minutes 5 days per week including some aerobic activity

## 2017-06-01 NOTE — Patient Instructions (Signed)
Let's see how labs look   Please work on diabetic diet and exercise  Also start 30 minutes of exercise per day  Keep working outside   Take care of yourself

## 2017-06-03 NOTE — Assessment & Plan Note (Signed)
Pt cannot afford copay at psychiatrist  Stable Less stress Stopped ativan  Continue wellbutrin and prozac  I will take these over  Reviewed stressors/ coping techniques/symptoms/ support sources/ tx options and side effects in detail today Disc imp of self care and addition of exercise

## 2017-06-03 NOTE — Assessment & Plan Note (Signed)
A1C today  Health habits not as good  Expects it to be up  disc imp of low glycemic diet and wt loss  Continue metformin

## 2017-06-03 NOTE — Assessment & Plan Note (Signed)
bp in fair control at this time  BP Readings from Last 1 Encounters:  06/01/17 132/78   No changes needed Most recent labs reviewed  Disc lifstyle change with low sodium diet and exercise  Labs rev

## 2017-06-03 NOTE — Assessment & Plan Note (Signed)
Discussed how this problem influences overall health and the risks it imposes  Reviewed plan for weight loss with lower calorie diet (via better food choices and also portion control or program like weight watchers) and exercise building up to or more than 30 minutes 5 days per week including some aerobic activity    

## 2017-06-03 NOTE — Assessment & Plan Note (Signed)
TSH today s/p dose change  Clinical improvement-more energy

## 2017-06-18 ENCOUNTER — Other Ambulatory Visit: Payer: Self-pay | Admitting: Family Medicine

## 2017-06-18 MED ORDER — LEVOTHYROXINE SODIUM 137 MCG PO TABS: 137.0000 ug | ORAL_TABLET | Freq: Every day | ORAL | 5 refills | Status: DC

## 2017-06-18 NOTE — Telephone Encounter (Signed)
Copied from [place] [id number]. Topic: Quick Communication - Rx Refill/Question >> Jun 18, 2017  9:31 AM Cleaster Corin, NT wrote: Medication:levothyroxine Wilmer Floor, LEVOTHROID) 137 MCG tablet [[id number]]   Has the patient contacted their pharmacy? yes (Agent: If no, request that the patient contact the pharmacy for the refill.) (Agent: If yes, when and what did the pharmacy advise?)  Preferred Pharmacy (with phone number or street name): [place] [address] Phone: [phone] Fax: [phone]    Agent: Please be advised that RX refills may take up to 3 business days. We ask that you follow-up with your pharmacy.

## 2017-06-20 DIAGNOSIS — Z23 Encounter for immunization: Secondary | ICD-10-CM | POA: Diagnosis not present

## 2017-07-24 ENCOUNTER — Ambulatory Visit: Payer: Self-pay | Admitting: *Deleted

## 2017-07-24 NOTE — Telephone Encounter (Signed)
Tylenol for ST and fever , rest and fluids  mucinex for cough/congestion  F/u if worse or no continued improvement (esp fever)

## 2017-07-24 NOTE — Telephone Encounter (Signed)
Patient is calling and states she has a sore throat, achy, and a temp of 101.2 since 07/20/17. She states she is feeling better everyday and would like to discuss over the counter meds.  Call to patient- patient states her grand daughter was recently ill with virus. She is having the same symptoms- fever, sore throat and cough. She is only using Tylenol now- she states the cough is not bad and she is only using throat lozenges. Patient was going to get current temperature for me and she broke her thermometer. Patient will have to buy another. Advised patient she needs to take a OTC medication to help break up the congestion with her cough- and drink plenty of fluids. Patient states she is doing ok with her glucose levels- but they are higher than normal because she is not eating as much.  Told patient I would check and see if Dr Glori Bickers wants her seen in the office- or if she is ok giving her perimeters to follow. Due to her age and risk factors- she may need to be seen. Patient is ok with that plan.  Reason for Disposition . [1] Fever > 100.0 F (37.8 C) AND [2] diabetes mellitus or weak immune system (e.g., HIV positive, cancer chemo, splenectomy, organ transplant, chronic steroids)  Answer Assessment - Initial Assessment Questions 1. TEMPERATURE: "What is the most recent temperature?"  "How was it measured?"      101.2 oral thermometer- yesterday afternoon 2. ONSET: "When did the fever start?"      7/5 3. SYMPTOMS: "Do you have any other symptoms besides the fever?"  (e.g., colds, headache, sore throat, earache, cough, rash, diarrhea, vomiting, abdominal pain)     Sore throat, aching 4. CAUSE: If there are no symptoms, ask: "What do you think is causing the fever?"      Granddaughter has been sick 5. CONTACTS: "Does anyone else in the family have an infection?"     Viral- ran course 6. TREATMENT: "What have you done so far to treat this fever?" (e.g., medications)     tylenol 7. IMMUNOCOMPROMISE:  "Do you have of the following: diabetes, HIV positive, splenectomy, cancer chemotherapy, chronic steroid treatment, transplant patient, etc."     diabetic 8. PREGNANCY: "Is there any chance you are pregnant?" "When was your last menstrual period?"     n/a 9. TRAVEL: "Have you traveled out of the country in the last month?" (e.g., travel history, exposures)     no  Protocols used: FEVER-A-AH

## 2017-07-24 NOTE — Telephone Encounter (Signed)
Pt notified of Dr. Tower's comments and verbalized understanding  

## 2017-07-27 ENCOUNTER — Telehealth: Payer: Self-pay

## 2017-07-27 ENCOUNTER — Ambulatory Visit (INDEPENDENT_AMBULATORY_CARE_PROVIDER_SITE_OTHER): Payer: Medicare Other | Admitting: Internal Medicine

## 2017-07-27 ENCOUNTER — Encounter: Payer: Self-pay | Admitting: Internal Medicine

## 2017-07-27 VITALS — BP 122/80 | HR 75 | Temp 98.9°F | Resp 24 | Ht 61.5 in | Wt 206.0 lb

## 2017-07-27 DIAGNOSIS — J0110 Acute frontal sinusitis, unspecified: Secondary | ICD-10-CM | POA: Diagnosis not present

## 2017-07-27 MED ORDER — BENZONATATE 200 MG PO CAPS: 200.0000 mg | ORAL_CAPSULE | Freq: Three times a day (TID) | ORAL | 0 refills | Status: DC | PRN

## 2017-07-27 MED ORDER — AMOXICILLIN 500 MG PO TABS: 1000.0000 mg | ORAL_TABLET | Freq: Two times a day (BID) | ORAL | 0 refills | Status: AC

## 2017-07-27 NOTE — Assessment & Plan Note (Signed)
Inflammation in left TM also noted Will try amoxil DM and benzonatate for cough Flonase, tylenol

## 2017-07-27 NOTE — Telephone Encounter (Signed)
PLEASE NOTE: All timestamps contained within this report are represented as [place] Standard Time. CONFIDENTIALTY NOTICE: This fax transmission is intended only for the addressee. It contains information that is legally privileged, confidential or otherwise protected from use or disclosure. If you are not the intended recipient, you are strictly prohibited from reviewing, disclosing, copying using or disseminating any of this information or taking any action in reliance on or regarding this information. If you have received this fax in error, please notify us immediately by telephone so that we can arrange for its return to [place]. Phone: [phone], Toll-Free: [phone], Fax: [phone] Page: 1 of 2 Call Id: [id number] [place] Patient Name: Andrea Cooper Gender: Female DOB: March 24, 1950 Age: 67 Y 32 M 6 D Return Phone Number: [phone] (Primary), [phone] (Secondary) Address: City/State/Zip: [place] [address] Client [facility] Night - Client Client Site [place] [facility], Roque Lias - MD Contact Type Call Who Is Calling Patient / Member / Family / Caregiver Call Type Triage / Clinical Relationship To Patient Self Return Phone Number [phone] (Primary) Chief Complaint Sore Throat Reason for Call Symptomatic / Request for [place] states has a sore throat and sinus trouble. Translation No Nurse Assessment Nurse: Roda Shutters, RN, Jane Date/Time (Eastern Time): 07/27/2017 6:20:48 AM Confirm and document reason for call. If symptomatic, describe symptoms. ---Caller states she has a cough, a soar throat and a sinus headache. Caller states she has been having these symptoms for about a week. She states she has no fever. It hurts when she coughs. The head ache is about a 4 Does the patient have  any new or worsening symptoms? ---Yes Will a triage be completed? ---Yes Related visit to physician within the last 2 weeks? ---No Does the PT have any chronic conditions? (i.e. diabetes, asthma, etc.) ---Yes List chronic conditions. ---DM Is this a behavioral health or substance abuse call? ---No Guidelines Guideline Title Affirmed Question Affirmed Notes Nurse Date/Time Eilene Ghazi Time) Sinus Pain or Congestion Lourdes Sledge, RN, Opal Sidles 07/27/2017 6:25:19 AM Disp. Time Eilene Ghazi Time) Disposition Final User 07/27/2017 6:33:37 AM See PCP within 24 Hours Yes Roda Shutters, RN, Irving Copas Disagree/Comply Comply Caller Understands Yes PreDisposition Go to Urgent Care/Walk-In Clinic Care Advice Given Per Guideline PLEASE NOTE: All timestamps contained within this report are represented as [place] Standard Time. CONFIDENTIALTY NOTICE: This fax transmission is intended only for the addressee. It contains information that is legally privileged, confidential or otherwise protected from use or disclosure. If you are not the intended recipient, you are strictly prohibited from reviewing, disclosing, copying using or disseminating any of this information or taking any action in reliance on or regarding this information. If you have received this fax in error, please notify us immediately by telephone so that we can arrange for its return to [place]. Phone: [phone], Toll-Free: [phone], Fax: [phone] Page: 2 of 2 Call Id: [id number] Care Advice Given Per Guideline SEE PCP WITHIN 24 HOURS: * IF OFFICE WILL BE OPEN: You need to be seen within the next 24 hours. Call your doctor (or NP/PA) when the office opens and make an appointment. PAIN MEDICINES: * For pain relief, take acetaminophen, ibuprofen, or naproxen. * Use the lowest amount that makes your pain feel better. ACETAMINOPHEN (E.G., TYLENOL): FOR A STUFFY NOSE - USE NASAL WASHES: * Introduction: Saline (salt water) nasal irrigation (nasal  wash) is an effective  and simple home remedy for treating stuffy nose and sinus congestion. The nose can be irrigated by pouring, spraying, or squirting salt water into the nose and then letting it run back out. STEP-BY-STEP INSTRUCTIONS: * STEP 1: Lean over a sink. * STEP 2: Gently squirt or spray warm salt water into one of your nostrils. * STEP 3: Some of the water may run into the back of your throat. Spit this out. If you swallow the salt water it will not hurt you. * STEP 4: Blow your nose to clean out the water and mucus. * STEP 5: Repeat steps 1-4 for the other nostril. You can do this a couple times a day if it seems to help you. HOW TO MAKE SALINE (SALT WATER) NASAL WASH: * You can make your own saline nasal wash. * Put 1 cup (8 oz; 240 ml) of water in a clean container. NASAL DECONGESTANTS FOR A VERY STUFFY NOSE: * MOST PEOPLE DO NOT NEED TO USE THESE MEDICINES. * If your nose feels blocked, you should try using nasal washes first. CAUTION - NASAL DECONGESTANTS: * Do not take these medications if you have high blood pressure, heart disease, prostate enlargement, or an overactive thyroid. CALL BACK IF: * Difficulty breathing (and not relieved by cleaning out nose) * You become worse. CARE ADVICE given per Sinus Pain or Congestion (Adult) guideline. Referrals REFERRED TO PCP OFFICE

## 2017-07-27 NOTE — Telephone Encounter (Signed)
Pt has appt 07/27/17 at 11:15 with Dr Silvio Pate.

## 2017-07-27 NOTE — Telephone Encounter (Signed)
Will assess at the [place]

## 2017-07-27 NOTE — Progress Notes (Signed)
Subjective:    Patient ID: Andrea Cooper, female    DOB: [birth year], 67 y.o.   MRN: [id number]  HPI Here due to respiratory infection  Started with sore throat Now with bad sinus (maxillary and frontal) and ear pain Coughing a lot---starting to cause low back pain  Started a week ago---worsening Low grade fever over the first 3 days Some chills and sweats Some SOB--feels like "I can't breathe deep when sitting"  Is prone to sinus symptoms Trying saline rinses, tylenol, flonase Tired---sleeping more  Current Outpatient Medications on File Prior to Visit  Medication Sig Dispense Refill  . aspirin EC 81 MG tablet Take 81 mg by mouth daily.    Marland Kitchen buPROPion (WELLBUTRIN XL) 300 MG 24 hr tablet Take 300 mg by mouth daily.    Marland Kitchen FLUoxetine (PROZAC) 40 MG capsule Take 80 mg by mouth daily.    Marland Kitchen glucose blood (ONE TOUCH ULTRA TEST) test strip CHECK BLOOD SUGAR TWICE DAILY AND AS DIRECTED Dx is E11.9 200 each 1  . hydrochlorothiazide (HYDRODIURIL) 25 MG tablet Take 1 tablet (25 mg total) by mouth daily. 90 tablet 3  . levothyroxine (SYNTHROID, LEVOTHROID) 137 MCG tablet Take 1 tablet (137 mcg total) by mouth daily before breakfast. 30 tablet 5  . metFORMIN (GLUCOPHAGE) 500 MG tablet Take 1 tablet (500 mg total) by mouth 2 (two) times daily with a meal. 180 tablet 3  . omeprazole (PRILOSEC) 20 MG capsule Take 1 capsule (20 mg total) by mouth daily. 90 capsule 3  . ONETOUCH DELICA LANCETS 41P MISC Check blood sugar twice a day and as directed. Dx E11.9 200 each 1  . potassium chloride (K-DUR,KLOR-CON) 10 MEQ tablet Take 1 tablet (10 mEq total) by mouth daily. 90 tablet 3  . pravastatin (PRAVACHOL) 20 MG tablet Take 1 tablet (20 mg total) by mouth daily. 90 tablet 3  . propranolol ER (INDERAL LA) 80 MG 24 hr capsule Take 1 capsule (80 mg total) by mouth daily. 90 capsule 3   No current facility-administered medications on file prior to visit.     Allergies  Allergen Reactions  . Codeine  Nausea And Vomiting    REACTION: nausea and vomiting  . Lisinopril     Cough/throat symptoms   . Latex Rash    Past Medical History:  Diagnosis Date  . GERD (gastroesophageal reflux disease)   . History of anxiety   . History of cerebral aneurysm repair    history of cerebral aneurysm   . History of depression   . History of hyperglycemia   . History of hyperlipidemia   . History of hypothyroidism   . History of skin cancer    hx of melanoma left lower arm, recurrance  . IBS (irritable bowel syndrome)   . Thyroid disease     Past Surgical History:  Procedure Laterality Date  . APPENDECTOMY  1966  . BREAST BIOPSY Left 07-29-14    core bx FIBROCYSTIC CHANGES WITH FEW MICROCALCIFICATIONS.  Marland Kitchen cardiolite  12/1997   normal EF 68%  . CEREBRAL ANEURYSM REPAIR  2008, 2009   coiling at [facility]  . EYE SURGERY     RR  . FOOT SURGERY  02/1998   bilateral  . INTRAOPERATIVE ARTERIOGRAM  10/17/2015  . [place]  . TOE SURGERY Left 2015  . TONSILLECTOMY  1964  . TOTAL ABDOMINAL HYSTERECTOMY W/ BILATERAL SALPINGOOPHORECTOMY      Family History  Problem Relation Age of Onset  . Diabetes  Father   . Depression Father   . CAD Father   . Diabetes Mother   . Diverticulosis Mother   . Adrenal disorder Mother   . Breast cancer Other        distant relatives (all on mother's side)  . Cerebral aneurysm Sister     Social History   Socioeconomic History  . Marital status: Married    Spouse name: Not on file  . Number of children: Not on file  . Years of education: Not on file  . Highest education level: Not on file  Occupational History  . Not on file  Social Needs  . Financial resource strain: Not on file  . Food insecurity:    Worry: Not on file    Inability: Not on file  . Transportation needs:    Medical: Not on file    Non-medical: Not on file  Tobacco Use  . Smoking status: Former Research scientist (life sciences)  . Smokeless tobacco: Former Systems developer    Quit date: 06/07/2006  Substance  and Sexual Activity  . Alcohol use: Yes    Alcohol/week: 0.6 oz    Types: 1 Standard drinks or equivalent per week    Comment: occasional wine  . Drug use: No  . Sexual activity: Never  Lifestyle  . Physical activity:    Days per week: Not on file    Minutes per session: Not on file  . Stress: Not on file  Relationships  . Social connections:    Talks on phone: Not on file    Gets together: Not on file    Attends religious service: Not on file    Active member of club or organization: Not on file    Attends meetings of clubs or organizations: Not on file    Relationship status: Not on file  . Intimate partner violence:    Fear of current or ex partner: Not on file    Emotionally abused: Not on file    Physically abused: Not on file    Forced sexual activity: Not on file  Other Topics Concern  . Not on file  Social History Narrative  . Not on file   Review of Systems  No rash No vomiting or diarrhea Appetite is off     Objective:   Physical Exam  Constitutional: No distress.  Looks a bit uncomfortable  HENT:  Mild right frontal tenderness Left TM with red streaking Mild pharyngeal injection without exudate Moderate nasal inflammation  Neck: No thyromegaly present.  Respiratory: Effort normal and breath sounds normal. No respiratory distress. She has no wheezes. She has no rales.  Lymphadenopathy:    She has no cervical adenopathy.  Skin: No rash noted.           Assessment & Plan:

## 2017-08-07 ENCOUNTER — Other Ambulatory Visit: Payer: Self-pay | Admitting: *Deleted

## 2017-08-07 MED ORDER — BUPROPION HCL ER (XL) 300 MG PO TB24: 300.0000 mg | ORAL_TABLET | Freq: Every day | ORAL | 3 refills | Status: DC

## 2017-08-07 MED ORDER — FLUOXETINE HCL 40 MG PO CAPS: 80.0000 mg | ORAL_CAPSULE | Freq: Every day | ORAL | 3 refills | Status: DC

## 2017-08-07 NOTE — Telephone Encounter (Signed)
Per last OV on 06/01/17 Dr. Glori Bickers agreed to take over psych meds. since new Rx to [place] I have to send to PCP for approval, please advise

## 2017-08-10 ENCOUNTER — Other Ambulatory Visit: Payer: Self-pay | Admitting: Family Medicine

## 2017-08-10 ENCOUNTER — Ambulatory Visit (INDEPENDENT_AMBULATORY_CARE_PROVIDER_SITE_OTHER): Payer: Medicare Other | Admitting: Family Medicine

## 2017-08-10 ENCOUNTER — Encounter: Payer: Self-pay | Admitting: Family Medicine

## 2017-08-10 VITALS — BP 156/109 | HR 71 | Temp 98.0°F | Ht 61.5 in | Wt 205.8 lb

## 2017-08-10 DIAGNOSIS — R0981 Nasal congestion: Secondary | ICD-10-CM

## 2017-08-10 DIAGNOSIS — R059 Cough, unspecified: Secondary | ICD-10-CM

## 2017-08-10 DIAGNOSIS — R05 Cough: Secondary | ICD-10-CM

## 2017-08-10 DIAGNOSIS — H6983 Other specified disorders of Eustachian tube, bilateral: Secondary | ICD-10-CM | POA: Diagnosis not present

## 2017-08-10 MED ORDER — FLUTICASONE PROPIONATE 50 MCG/ACT NA SUSP: 2.0000 | Freq: Every day | NASAL | 6 refills | Status: DC

## 2017-08-10 MED ORDER — GLUCOSE BLOOD VI STRP: ORAL_STRIP | 1 refills | Status: DC

## 2017-08-10 NOTE — Patient Instructions (Signed)
Good to see you today  I have sent fluticasone to your pharmacy- use two sprays in each nostril at bedtime for 3-4 weeks  For nasal congestion you can use saline nasal spray (generic is fine for all). For cough you can try Delsym. Drink enough fluids to make your urine light yellow. For fever/chill/muscle aches you can take over the counter acetaminophen or ibuprofen.  Please come back in if you are not better in 5-7 days or if you develop wheezing, shortness of breath or persistent vomiting.

## 2017-08-10 NOTE — Progress Notes (Signed)
Subjective:    Patient ID: Andrea Cooper, female    DOB: Jan 07, 1951, 67 y.o.   MRN: [id number]  HPI This is a 67 yo female who presents today with ear pain x 4 days. Was seen two weeks ago with sore throat, ear pain, sinus pain and cough and was treated with amoxicillin 1000 mg BID x 7 days. Has been feeling better except for bilateral ear pain L>R and some dizziness which is better today. Continues to have a lot of post nasal drainage, some nasal congestion. Cough is non productive but sounds loose. No fever since amoxicillin. Headache across front of head. No medications for symptoms. No SOB or wheeze.   Past Medical History:  Diagnosis Date  . GERD (gastroesophageal reflux disease)   . History of anxiety   . History of cerebral aneurysm repair    history of cerebral aneurysm   . History of depression   . History of hyperglycemia   . History of hyperlipidemia   . History of hypothyroidism   . History of skin cancer    hx of melanoma left lower arm, recurrance  . IBS (irritable bowel syndrome)   . Thyroid disease    Past Surgical History:  Procedure Laterality Date  . APPENDECTOMY  1966  . BREAST BIOPSY Left 07-29-14    core bx FIBROCYSTIC CHANGES WITH FEW MICROCALCIFICATIONS.  Marland Kitchen cardiolite  12/1997   normal EF 68%  . CEREBRAL ANEURYSM REPAIR  2008, 2009   coiling at [facility]  . EYE SURGERY     RR  . FOOT SURGERY  02/1998   bilateral  . INTRAOPERATIVE ARTERIOGRAM  10/17/2015  . [place]  . TOE SURGERY Left 2015  . TONSILLECTOMY  1964  . TOTAL ABDOMINAL HYSTERECTOMY W/ BILATERAL SALPINGOOPHORECTOMY     Family History  Problem Relation Age of Onset  . Diabetes Father   . Depression Father   . CAD Father   . Diabetes Mother   . Diverticulosis Mother   . Adrenal disorder Mother   . Breast cancer Other        distant relatives (all on mother's side)  . Cerebral aneurysm Sister    Social History   Tobacco Use  . Smoking status: Former Research scientist (life sciences)  . Smokeless  tobacco: Former Systems developer    Quit date: 06/07/2006  Substance Use Topics  . Alcohol use: Yes    Alcohol/week: 0.6 oz    Types: 1 Standard drinks or equivalent per week    Comment: occasional wine  . Drug use: No      Review of Systems Per HPI    Objective:   Physical Exam  Constitutional: She is oriented to person, place, and time. She appears well-developed and well-nourished. No distress.  HENT:  Head: Normocephalic and atraumatic.  Right Ear: External ear and ear canal normal.  Left Ear: External ear and ear canal normal.  Nose: Mucosal edema and rhinorrhea present.  Mouth/Throat: Oropharynx is clear and moist.  Bilateral TM dull  Eyes: Conjunctivae are normal.  Neck: Normal range of motion. Neck supple.  Cardiovascular: Normal rate, regular rhythm and normal heart sounds.  Pulmonary/Chest: Effort normal and breath sounds normal. No stridor. No respiratory distress. She has no wheezes. She has no rales.  Occasional cough witnessed.   Neurological: She is alert and oriented to person, place, and time.  Skin: Skin is warm and dry. She is not diaphoretic.  Psychiatric: She has a normal mood and affect. Her behavior is  normal. Judgment and thought content normal.  Vitals reviewed.     BP (!) 156/109 (BP Location: Right Arm, Patient Position: Sitting, Cuff Size: Normal)   Pulse 71   Temp 98 F (36.7 C) (Oral)   Ht 5' 1.5" (1.562 m)   Wt 205 lb 12 oz (93.3 kg)   SpO2 97%   BMI 38.25 kg/m  Wt Readings from Last 3 Encounters:  08/10/17 205 lb 12 oz (93.3 kg)  07/27/17 206 lb (93.4 kg)  06/01/17 205 lb 12 oz (93.3 kg)   BP Readings from Last 3 Encounters:  08/10/17 (!) 156/109  07/27/17 122/80  06/01/17 132/78       Assessment & Plan:  1. Dysfunction of both eustachian tubes - fluticasone (FLONASE) 50 MCG/ACT nasal spray; Place 2 sprays into both nostrils daily.  Dispense: 16 g; Refill: 6 - Provided written and verbal information regarding diagnosis and treatment. -  encouraged good fluid intake, rest  2. Nasal congestion - see #1  3. Cough - did not get Benzonate filled, can use OTC Delsym - RTC precautions reviewed    Clarene Reamer, FNP-BC  [facility]  08/10/2017 5:33 PM

## 2017-10-24 DIAGNOSIS — Z23 Encounter for immunization: Secondary | ICD-10-CM | POA: Diagnosis not present

## 2017-11-07 ENCOUNTER — Ambulatory Visit (INDEPENDENT_AMBULATORY_CARE_PROVIDER_SITE_OTHER): Payer: Medicare Other | Admitting: Family Medicine

## 2017-11-07 ENCOUNTER — Encounter: Payer: Self-pay | Admitting: Family Medicine

## 2017-11-07 VITALS — BP 128/78 | HR 76 | Temp 97.8°F | Ht 61.5 in | Wt 209.0 lb

## 2017-11-07 DIAGNOSIS — R197 Diarrhea, unspecified: Secondary | ICD-10-CM | POA: Diagnosis not present

## 2017-11-07 NOTE — Progress Notes (Signed)
Subjective:    Patient ID: Andrea Cooper, female    DOB: [birth year], 67 y.o.   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   No cramping or pain   No change in diet    Wt Readings from Last 3 Encounters:  11/07/17 209 lb (94.8 kg)  08/10/17 205 lb 12 oz (93.3 kg)  07/27/17 206 lb (93.4 kg)   38.85 kg/m   Eats yogurt  Has not taken probiotics   Last colonoscopy 2015 -tics  She has not been bothered by nuts or seeds   H/o IBS- primary diarrhea - but never this bad before   Lab Results  Component Value Date   CREATININE 0.62 01/19/2017   BUN 20 01/19/2017   NA 140 01/19/2017   K 4.2 01/19/2017   CL 102 01/19/2017   CO2 30 01/19/2017   Lab Results  Component Value Date   TSH 1.08 06/01/2017     Patient Active Problem List   Diagnosis Date Noted  . Diarrhea 11/07/2017  . Dysphagia 01/26/2017  . Hearing loss 02/14/2016  . Estrogen deficiency 02/14/2016  . GERD (gastroesophageal reflux disease) 08/13/2015  . Fat necrosis (segmental) of breast 08/27/2014  . Visit for screening mammogram 07/25/2014  . Encounter for Medicare annual wellness exam 07/14/2014  . Hypokalemia 12/15/2013  . Other screening mammogram 06/07/2011  . SLEEP APNEA 09/13/2009  . Obesity 01/13/2009  . POSTMENOPAUSAL STATUS 10/01/2008  . BACK PAIN, LUMBAR, WITH RADICULOPATHY 08/10/2008  . SWEATING 07/15/2008  . Diabetes type 2, controlled ([place]) 05/22/2007  . TOBACCO USE, QUIT 05/22/2007  . CEREBRAL ANEURYSM 08/09/2006  . Hypothyroidism 07/25/2006    . Hyperlipidemia 07/25/2006  . ANXIETY 07/25/2006  . Depression with anxiety 07/25/2006  . Essential hypertension 07/25/2006  . IBS 07/25/2006  . OVEREATING 07/25/2006  . SKIN CANCER, HX OF 07/25/2006   Past Medical History:  Diagnosis Date  . GERD (gastroesophageal reflux disease)   . History of anxiety   . History of cerebral aneurysm repair    history of cerebral aneurysm   . History of depression   . History of hyperglycemia   . History of hyperlipidemia   . History of hypothyroidism   . History of skin cancer    hx of melanoma left lower arm, recurrance  . IBS (irritable bowel syndrome)   . Thyroid disease    Past Surgical History:  Procedure Laterality Date  . APPENDECTOMY  1966  . BREAST BIOPSY Left 07-29-14    core bx FIBROCYSTIC CHANGES WITH FEW MICROCALCIFICATIONS.  Marland Kitchen cardiolite  12/1997   normal EF 68%  . CEREBRAL ANEURYSM REPAIR  2008, 2009   coiling [facility]  . EYE SURGERY     RR  . FOOT SURGERY  02/1998   bilateral  . INTRAOPERATIVE ARTERIOGRAM  10/17/2015  . [place]  . TOE SURGERY Left 2015  . TONSILLECTOMY  1964  . TOTAL ABDOMINAL HYSTERECTOMY W/ BILATERAL SALPINGOOPHORECTOMY  Social History   Tobacco Use  . Smoking status: Former Research scientist (life sciences)  . Smokeless tobacco: Former Systems developer    Quit date: 06/07/2006  Substance Use Topics  . Alcohol use: Yes    Alcohol/week: 1.0 standard drinks    Types: 1 Standard drinks or equivalent per week    Comment: occasional wine  . Drug use: No   Family History  Problem Relation Age of Onset  . Diabetes Father   . Depression Father   . CAD Father   . Diabetes Mother   . Diverticulosis Mother   . Adrenal disorder Mother   . Breast cancer Other        distant relatives (all on mother's side)  . Cerebral aneurysm Sister    Allergies  Allergen Reactions  . Codeine Nausea And Vomiting    REACTION: nausea and vomiting  . Lisinopril     Cough/throat symptoms   . Latex Rash   Current Outpatient  Medications on File Prior to Visit  Medication Sig Dispense Refill  . aspirin EC 81 MG tablet Take 81 mg by mouth daily.    Marland Kitchen buPROPion (WELLBUTRIN XL) 300 MG 24 hr tablet Take 1 tablet (300 mg total) by mouth daily. 90 tablet 3  . FLUoxetine (PROZAC) 40 MG capsule Take 2 capsules (80 mg total) by mouth daily. 180 capsule 3  . fluticasone (FLONASE) 50 MCG/ACT nasal spray Place 2 sprays into both nostrils daily. 16 g 6  . glucose blood (ONE TOUCH ULTRA TEST) test strip USE 1 STRIP TO CHECK GLUCOSE TWICE DAILY 200 each 0  . hydrochlorothiazide (HYDRODIURIL) 25 MG tablet Take 1 tablet (25 mg total) by mouth daily. 90 tablet 3  . levothyroxine (SYNTHROID, LEVOTHROID) 137 MCG tablet Take 1 tablet (137 mcg total) by mouth daily before breakfast. 30 tablet 5  . metFORMIN (GLUCOPHAGE) 500 MG tablet Take 1 tablet (500 mg total) by mouth 2 (two) times daily with a meal. 180 tablet 3  . omeprazole (PRILOSEC) 20 MG capsule Take 1 capsule (20 mg total) by mouth daily. 90 capsule 3  . ONETOUCH DELICA LANCETS 74Q MISC Check blood sugar twice a day and as directed. Dx E11.9 200 each 1  . potassium chloride (K-DUR,KLOR-CON) 10 MEQ tablet Take 1 tablet (10 mEq total) by mouth daily. 90 tablet 3  . pravastatin (PRAVACHOL) 20 MG tablet Take 1 tablet (20 mg total) by mouth daily. 90 tablet 3  . propranolol ER (INDERAL LA) 80 MG 24 hr capsule Take 1 capsule (80 mg total) by mouth daily. 90 capsule 3   No current facility-administered medications on file prior to visit.     Review of Systems  Constitutional: Negative for activity change, appetite change, fatigue, fever and unexpected weight change.  HENT: Negative for congestion, ear pain, rhinorrhea, sinus pressure and sore throat.   Eyes: Negative for pain, redness and visual disturbance.  Respiratory: Negative for cough, shortness of breath and wheezing.   Cardiovascular: Negative for chest pain and palpitations.  Gastrointestinal: Positive for abdominal  distention and diarrhea. Negative for abdominal pain, blood in stool, constipation, nausea, rectal pain and vomiting.       Bloating   Endocrine: Negative for polydipsia and polyuria.  Genitourinary: Negative for dysuria, frequency and urgency.  Musculoskeletal: Negative for arthralgias, back pain and myalgias.  Skin: Negative for pallor and rash.  Allergic/Immunologic: Negative for environmental allergies.  Neurological: Negative for dizziness, syncope and headaches.  Hematological: Negative for adenopathy. Does not bruise/bleed easily.  Psychiatric/Behavioral: Negative  for decreased concentration and dysphoric mood. The patient is not nervous/anxious.        Objective:   Physical Exam  Constitutional: She appears well-developed and well-nourished. No distress.  obese and well appearing   HENT:  Head: Normocephalic and atraumatic.  Mouth/Throat: Oropharynx is clear and moist.  Eyes: Pupils are equal, round, and reactive to light. Conjunctivae and EOM are normal. No scleral icterus.  Neck: Normal range of motion. Neck supple.  Cardiovascular: Normal rate, regular rhythm and normal heart sounds.  Pulmonary/Chest: Effort normal and breath sounds normal. No respiratory distress. She has no wheezes. She has no rales.  Abdominal: Soft. Bowel sounds are normal. She exhibits no distension and no mass. There is no hepatosplenomegaly. There is no tenderness. There is no rigidity, no rebound, no guarding, no CVA tenderness, no tenderness at McBurney's point and negative Murphy's sign.  Lymphadenopathy:    She has no cervical adenopathy.  Neurological: She is alert.  Skin: Skin is warm and dry. No erythema. No pallor.  Psychiatric: She has a normal mood and affect.  Pleasant           Assessment & Plan:   Problem List Items Addressed This Visit      Other   Diarrhea - Primary    Worse than usual in pt with diarrhea predominant IBS rec trial of fiber supplement  Also probiotic Lab  and stool studies ( c diff/ cx) today  Last colonoscopy 2015        Relevant Orders   CBC with Differential/Platelet (Completed)   Comprehensive metabolic panel (Completed)   C. difficile GDH and Toxin A/B (Completed)   Stool culture (Completed)

## 2017-11-07 NOTE — Patient Instructions (Signed)
Get some citrucel over the counter  One serving per day - to bulk up stool and slow it down - if not helpful/ stop it   Also get Align  over the counter- (probiotic) - and take as directed   Labs and stool studies today   A diet journal may help

## 2017-11-08 DIAGNOSIS — R197 Diarrhea, unspecified: Secondary | ICD-10-CM | POA: Diagnosis not present

## 2017-11-08 LAB — COMPREHENSIVE METABOLIC PANEL
ALBUMIN: 4.3 g/dL (ref 3.5–5.2)
ALK PHOS: 41 U/L (ref 39–117)
ALT: 22 U/L (ref 0–35)
AST: 17 U/L (ref 0–37)
BUN: 18 mg/dL (ref 6–23)
CO2: 31 mEq/L (ref 19–32)
Calcium: 10.4 mg/dL (ref 8.4–10.5)
Chloride: 105 mEq/L (ref 96–112)
Creatinine, Ser: 0.76 mg/dL (ref 0.40–1.20)
GFR: 80.23 mL/min (ref >60.00)
GLUCOSE: 134 mg/dL — AB (ref 70–99)
POTASSIUM: 4.6 meq/L (ref 3.5–5.1)
Sodium: 144 mEq/L (ref 135–145)
TOTAL PROTEIN: 7.3 g/dL (ref 6.0–8.3)
Total Bilirubin: 0.4 mg/dL (ref 0.2–1.2)

## 2017-11-08 LAB — CBC WITH DIFFERENTIAL/PLATELET
BASOS ABS: 0.1 10*3/uL (ref 0.0–0.1)
Basophils Relative: 0.8 % (ref 0.0–3.0)
EOS PCT: 1.8 % (ref 0.0–5.0)
Eosinophils Absolute: 0.1 10*3/uL (ref 0.0–0.7)
HCT: 41.9 % (ref 36.0–46.0)
HEMOGLOBIN: 14.3 g/dL (ref 12.0–15.0)
LYMPHS PCT: 39.7 % (ref 12.0–46.0)
Lymphs Abs: 2.8 10*3/uL (ref 0.7–4.0)
MCHC: 34.2 g/dL (ref 30.0–36.0)
MCV: 90.6 fl (ref 78.0–100.0)
Monocytes Absolute: 0.7 10*3/uL (ref 0.1–1.0)
Monocytes Relative: 9.7 % (ref 3.0–12.0)
Neutro Abs: 3.3 10*3/uL (ref 1.4–7.7)
Neutrophils Relative %: 48 % (ref 43.0–77.0)
Platelets: 297 10*3/uL (ref 150.0–400.0)
RBC: 4.62 Mil/uL (ref 3.87–5.11)
RDW: 14.5 % (ref 11.5–15.5)
WBC: 6.9 10*3/uL (ref 4.0–10.5)

## 2017-11-11 NOTE — Assessment & Plan Note (Signed)
Worse than usual in pt with diarrhea predominant IBS rec trial of fiber supplement  Also probiotic Lab and stool studies ( c diff/ cx) today  Last colonoscopy 2015

## 2017-11-12 LAB — STOOL CULTURE
MICRO NUMBER: 91280961
MICRO NUMBER:: 91280959
MICRO NUMBER:: 91280962
SHIGA RESULT:: NOT DETECTED
SPECIMEN QUALITY: ADEQUATE
SPECIMEN QUALITY:: ADEQUATE
SPECIMEN QUALITY:: ADEQUATE

## 2017-11-12 LAB — C. DIFFICILE GDH AND TOXIN A/B
GDH ANTIGEN: NOT DETECTED
MICRO NUMBER:: 91280960
SPECIMEN QUALITY: ADEQUATE
TOXIN A AND B: NOT DETECTED

## 2017-11-14 ENCOUNTER — Telehealth: Payer: Self-pay | Admitting: Family Medicine

## 2017-11-14 DIAGNOSIS — K580 Irritable bowel syndrome with diarrhea: Secondary | ICD-10-CM

## 2017-11-14 NOTE — Telephone Encounter (Signed)
-----   Message from [place], [place] sent at 11/14/2017  8:54 AM EDT ----- Pt does want to see GI she said she's had these sxs so long she wants to f/u with a GI, please put referral in and I advise pt our [facility] will call to schedule appt. Pt would like to see GI in [place] if possible

## 2017-11-14 NOTE — Telephone Encounter (Signed)
Ref done  Will route to [facility] 

## 2017-11-14 NOTE — Telephone Encounter (Signed)
Referral faxed to [facility] and patient is aware.

## 2017-11-14 NOTE — Telephone Encounter (Signed)
Called pt back and addressed in result notes  

## 2017-11-14 NOTE — Telephone Encounter (Signed)
Pt returned call about voicemail she received.

## 2017-11-16 ENCOUNTER — Other Ambulatory Visit: Payer: Self-pay | Admitting: Family Medicine

## 2017-11-22 DIAGNOSIS — R140 Abdominal distension (gaseous): Secondary | ICD-10-CM | POA: Diagnosis not present

## 2017-11-22 DIAGNOSIS — K591 Functional diarrhea: Secondary | ICD-10-CM | POA: Diagnosis not present

## 2017-11-22 DIAGNOSIS — R1032 Left lower quadrant pain: Secondary | ICD-10-CM | POA: Diagnosis not present

## 2017-11-22 DIAGNOSIS — R109 Unspecified abdominal pain: Secondary | ICD-10-CM | POA: Diagnosis not present

## 2017-11-22 DIAGNOSIS — R194 Change in bowel habit: Secondary | ICD-10-CM | POA: Diagnosis not present

## 2017-11-22 DIAGNOSIS — R1031 Right lower quadrant pain: Secondary | ICD-10-CM | POA: Diagnosis not present

## 2017-11-23 ENCOUNTER — Other Ambulatory Visit
Admission: RE | Admit: 2017-11-23 | Discharge: 2017-11-23 | Disposition: A | Discharge status: Home / Self Care | Payer: Medicare Other | Source: Ambulatory Visit | Attending: Gastroenterology | Admitting: Gastroenterology

## 2017-11-23 DIAGNOSIS — K591 Functional diarrhea: Secondary | ICD-10-CM | POA: Insufficient documentation

## 2017-11-23 LAB — GASTROINTESTINAL PANEL BY PCR, STOOL (REPLACES STOOL CULTURE)
ADENOVIRUS F40/41: NOT DETECTED
ASTROVIRUS: NOT DETECTED
Campylobacter species: NOT DETECTED
Cryptosporidium: NOT DETECTED
Cyclospora cayetanensis: NOT DETECTED
ENTEROPATHOGENIC E COLI (EPEC): NOT DETECTED
Entamoeba histolytica: NOT DETECTED
Enteroaggregative E coli (EAEC): NOT DETECTED
Enterotoxigenic E coli (ETEC): NOT DETECTED
Giardia lamblia: NOT DETECTED
NOROVIRUS GI/GII: NOT DETECTED
PLESIMONAS SHIGELLOIDES: NOT DETECTED
Rotavirus A: NOT DETECTED
SAPOVIRUS (I, II, IV, AND V): NOT DETECTED
SHIGELLA/ENTEROINVASIVE E COLI (EIEC): NOT DETECTED
Salmonella species: NOT DETECTED
Shiga like toxin producing E coli (STEC): NOT DETECTED
Vibrio cholerae: NOT DETECTED
Vibrio species: NOT DETECTED
Yersinia enterocolitica: NOT DETECTED

## 2017-11-27 LAB — PANCREATIC ELASTASE, FECAL: PANCREATIC ELASTASE-1, STL: 372 ug Elast./g (ref >200)

## 2017-12-04 ENCOUNTER — Other Ambulatory Visit: Payer: Self-pay | Admitting: Family Medicine

## 2017-12-14 ENCOUNTER — Emergency Department: Payer: Medicare Other

## 2017-12-14 ENCOUNTER — Other Ambulatory Visit: Payer: Self-pay

## 2017-12-14 ENCOUNTER — Emergency Department
Admission: EM | Admit: 2017-12-14 | Discharge: 2017-12-14 | Disposition: A | Discharge status: Home / Self Care | Payer: Medicare Other | Attending: Emergency Medicine | Admitting: Emergency Medicine

## 2017-12-14 ENCOUNTER — Encounter: Payer: Self-pay | Admitting: Emergency Medicine

## 2017-12-14 DIAGNOSIS — Y999 Unspecified external cause status: Secondary | ICD-10-CM | POA: Insufficient documentation

## 2017-12-14 DIAGNOSIS — R51 Headache: Secondary | ICD-10-CM | POA: Diagnosis not present

## 2017-12-14 DIAGNOSIS — Z85820 Personal history of malignant melanoma of skin: Secondary | ICD-10-CM | POA: Insufficient documentation

## 2017-12-14 DIAGNOSIS — I10 Essential (primary) hypertension: Secondary | ICD-10-CM | POA: Insufficient documentation

## 2017-12-14 DIAGNOSIS — W1839XA Other fall on same level, initial encounter: Secondary | ICD-10-CM | POA: Diagnosis not present

## 2017-12-14 DIAGNOSIS — S0993XA Unspecified injury of face, initial encounter: Secondary | ICD-10-CM | POA: Diagnosis not present

## 2017-12-14 DIAGNOSIS — Y929 Unspecified place or not applicable: Secondary | ICD-10-CM | POA: Diagnosis not present

## 2017-12-14 DIAGNOSIS — S0012XA Contusion of left eyelid and periocular area, initial encounter: Secondary | ICD-10-CM | POA: Diagnosis not present

## 2017-12-14 DIAGNOSIS — S0990XA Unspecified injury of head, initial encounter: Secondary | ICD-10-CM | POA: Diagnosis not present

## 2017-12-14 DIAGNOSIS — Z7984 Long term (current) use of oral hypoglycemic drugs: Secondary | ICD-10-CM | POA: Insufficient documentation

## 2017-12-14 DIAGNOSIS — Z7982 Long term (current) use of aspirin: Secondary | ICD-10-CM | POA: Diagnosis not present

## 2017-12-14 DIAGNOSIS — Z87891 Personal history of nicotine dependence: Secondary | ICD-10-CM | POA: Insufficient documentation

## 2017-12-14 DIAGNOSIS — S0083XA Contusion of other part of head, initial encounter: Secondary | ICD-10-CM | POA: Diagnosis not present

## 2017-12-14 DIAGNOSIS — S0003XD Contusion of scalp, subsequent encounter: Secondary | ICD-10-CM | POA: Diagnosis not present

## 2017-12-14 DIAGNOSIS — E039 Hypothyroidism, unspecified: Secondary | ICD-10-CM | POA: Insufficient documentation

## 2017-12-14 DIAGNOSIS — Y939 Activity, unspecified: Secondary | ICD-10-CM | POA: Diagnosis not present

## 2017-12-14 DIAGNOSIS — E119 Type 2 diabetes mellitus without complications: Secondary | ICD-10-CM | POA: Insufficient documentation

## 2017-12-14 DIAGNOSIS — S0083XD Contusion of other part of head, subsequent encounter: Secondary | ICD-10-CM

## 2017-12-14 NOTE — ED Notes (Signed)
Pt reports hx of multiple aneurysms with coiling in past. Pt denies taking blood thinners. Pt also denies vision impairment. Family at bedside.

## 2017-12-14 NOTE — Discharge Instructions (Signed)
Return to the ER for new, worsening, or persistent severe pain, any vision changes, vomiting, or any other new or worsening symptoms that concern you.

## 2017-12-14 NOTE — ED Triage Notes (Signed)
Patient to ER for c/o fall last night. States she was seen at urgent care and sent to ER for imaging. Patient denies any vision problems, dizziness, or severe headache. Bruising present around left orbit.

## 2017-12-14 NOTE — ED Notes (Signed)
No CT orders at this time per Dr. Archie Balboa. Verified that patient is not on any blood thinners.

## 2017-12-14 NOTE — ED Provider Notes (Signed)
[facility] Emergency Department Provider Note ____________________________________________   First MD Initiated Contact with Patient 12/14/17 1445     (approximate)  I have reviewed the triage vital signs and the nursing notes.   HISTORY  Chief Complaint Fall and Facial Injury    HPI Andrea Cooper is a 67 y.o. female with PMH as noted below who presents with facial injury, acute onset last night when the patient had a mechanical fall from standing height, associated with redness and swelling around her left eye.  The patient denies LOC, she denies any severe headache or vomiting, and denies blurred vision or diplopia.  She is not on any anticoagulants or antiplatelets.  She went to urgent care but was referred to the ER for imaging.  Past Medical History:  Diagnosis Date  . GERD (gastroesophageal reflux disease)   . History of anxiety   . History of cerebral aneurysm repair    history of cerebral aneurysm   . History of depression   . History of hyperglycemia   . History of hyperlipidemia   . History of hypothyroidism   . History of skin cancer    hx of melanoma left lower arm, recurrance  . IBS (irritable bowel syndrome)   . Thyroid disease     Patient Active Problem List   Diagnosis Date Noted  . Diarrhea 11/07/2017  . Dysphagia 01/26/2017  . Hearing loss 02/14/2016  . Estrogen deficiency 02/14/2016  . GERD (gastroesophageal reflux disease) 08/13/2015  . Fat necrosis (segmental) of breast 08/27/2014  . Visit for screening mammogram 07/25/2014  . Encounter for Medicare annual wellness exam 07/14/2014  . Hypokalemia 12/15/2013  . Other screening mammogram 06/07/2011  . SLEEP APNEA 09/13/2009  . Obesity 01/13/2009  . POSTMENOPAUSAL STATUS 10/01/2008  . BACK PAIN, LUMBAR, WITH RADICULOPATHY 08/10/2008  . SWEATING 07/15/2008  . Diabetes type 2, controlled ([place]) 05/22/2007  . TOBACCO USE, QUIT 05/22/2007  . CEREBRAL ANEURYSM 08/09/2006  .  Hypothyroidism 07/25/2006  . Hyperlipidemia 07/25/2006  . ANXIETY 07/25/2006  . Depression with anxiety 07/25/2006  . Essential hypertension 07/25/2006  . IBS 07/25/2006  . OVEREATING 07/25/2006  . SKIN CANCER, HX OF 07/25/2006    Past Surgical History:  Procedure Laterality Date  . APPENDECTOMY  1966  . BREAST BIOPSY Left 07-29-14    core bx FIBROCYSTIC CHANGES WITH FEW MICROCALCIFICATIONS.  Marland Kitchen cardiolite  12/1997   normal EF 68%  . CEREBRAL ANEURYSM REPAIR  2008, 2009   coiling at [facility]  . EYE SURGERY     RR  . FOOT SURGERY  02/1998   bilateral  . INTRAOPERATIVE ARTERIOGRAM  10/17/2015  . [place]  . TOE SURGERY Left 2015  . TONSILLECTOMY  1964  . TOTAL ABDOMINAL HYSTERECTOMY W/ BILATERAL SALPINGOOPHORECTOMY      Prior to Admission medications   Medication Sig Start Date End Date Taking? Authorizing Provider  aspirin EC 81 MG tablet Take 81 mg by mouth daily.    [provider]  buPROPion (WELLBUTRIN XL) 300 MG 24 hr tablet Take 1 tablet (300 mg total) by mouth daily. 08/07/17   Tower, Wynelle Fanny, MD  FLUoxetine (PROZAC) 40 MG capsule Take 2 capsules (80 mg total) by mouth daily. 08/07/17   Tower, Wynelle Fanny, MD  fluticasone (FLONASE) 50 MCG/ACT nasal spray Place 2 sprays into both nostrils daily. 08/10/17   Elby Beck, FNP  glucose blood (ONE TOUCH ULTRA TEST) test strip USE 1 STRIP TO CHECK GLUCOSE TWICE  DAILY 08/10/17   Tower, Wynelle Fanny, MD  hydrochlorothiazide (HYDRODIURIL) 25 MG tablet Take 1 tablet (25 mg total) by mouth daily. 01/26/17   Tower, Wynelle Fanny, MD  levothyroxine (SYNTHROID, LEVOTHROID) 137 MCG tablet TAKE ONE TABLET BY MOUTH DAILY BEFORE BREAKFAST 12/04/17   Tower, Wynelle Fanny, MD  metFORMIN (GLUCOPHAGE) 500 MG tablet Take 1 tablet (500 mg total) by mouth 2 (two) times daily with a meal. 01/26/17   Tower, Wynelle Fanny, MD  omeprazole (PRILOSEC) 20 MG capsule Take 1 capsule (20 mg total) by mouth daily. 01/26/17   Tower, Wynelle Fanny, MD  [facility] DELICA LANCETS  40G MISC Check blood sugar twice a day and as directed. Dx E11.9 05/31/15   Tower, Wynelle Fanny, MD  potassium chloride (K-DUR,KLOR-CON) 10 MEQ tablet TAKE ONE TABLET BY MOUTH DAILY 11/16/17   Tower, Wynelle Fanny, MD  pravastatin (PRAVACHOL) 20 MG tablet Take 1 tablet (20 mg total) by mouth daily. 01/26/17   Tower, Wynelle Fanny, MD  propranolol ER (INDERAL LA) 80 MG 24 hr capsule Take 1 capsule (80 mg total) by mouth daily. 01/26/17   Tower, Wynelle Fanny, MD    Allergies Codeine; Lisinopril; and Latex  Family History  Problem Relation Age of Onset  . Diabetes Father   . Depression Father   . CAD Father   . Diabetes Mother   . Diverticulosis Mother   . Adrenal disorder Mother   . Breast cancer Other        distant relatives (all on mother's side)  . Cerebral aneurysm Sister     Social History Social History   Tobacco Use  . Smoking status: Former Research scientist (life sciences)  . Smokeless tobacco: Former Systems developer    Quit date: 06/07/2006  Substance Use Topics  . Alcohol use: Yes    Alcohol/week: 1.0 standard drinks    Types: 1 Standard drinks or equivalent per week    Comment: occasional wine  . Drug use: No    Review of Systems  Constitutional: No fever. Eyes: No redness.  No diplopia.   ENT: Positive for mild neck pain. Cardiovascular: Denies chest pain. Respiratory: Denies shortness of breath. Gastrointestinal: No vomiting. Genitourinary: Negative for flank pain. Musculoskeletal: Negative for back pain. Skin: Negative for rash. Neurological: Negative for headache.   ____________________________________________   PHYSICAL EXAM:  VITAL SIGNS: ED Triage Vitals [12/14/17 1233]  Enc Vitals Group     BP (!) 160/78     Pulse Rate 66     Resp 20     Temp 97.6 F (36.4 C)     Temp Source Oral     SpO2 95 %     Weight 200 lb (90.7 kg)     Height 5\' 2"  (1.575 m)     Head Circumference      Peak Flow      Pain Score 1     Pain Loc      Pain Edu?      Excl. in [place]?     Constitutional: Alert and oriented.  Well appearing and in no acute distress. Eyes: Conjunctivae are normal.  EOMI.  PERRLA. Head: Left periorbital and maxillary area swelling and tenderness. Nose: No congestion/rhinnorhea. Mouth/Throat: Mucous membranes are moist.   Neck: Normal range of motion.  No midline cervical spinal tenderness. Cardiovascular: Good peripheral circulation. Respiratory: Normal respiratory effort. Gastrointestinal:  No distention.  Musculoskeletal:  Extremities warm and well perfused.  Neurologic:  Normal speech and language.  Motor and sensory intact in all extremities.  Cranial nerves III through XII intact.  Normal coordination with no ataxia on finger-to-nose.  No gross focal neurologic deficits are appreciated.  Skin:  Skin is warm and dry. No rash noted. Psychiatric: Mood and affect are normal. Speech and behavior are normal.  ____________________________________________   LABS (all labs ordered are listed, but only abnormal results are displayed)  Labs Reviewed - No data to display ____________________________________________  EKG   ____________________________________________  RADIOLOGY  CT head: No ICH CT maxillofacial: No acute fracture  ____________________________________________   PROCEDURES  Procedure(s) performed: No  Procedures  Critical Care performed: No ____________________________________________   INITIAL IMPRESSION / ASSESSMENT AND PLAN / ED COURSE  Pertinent labs & imaging results that were available during my care of the patient were reviewed by me and considered in my medical decision making (see chart for details).  67 year old female with PMH as noted above presents after a mechanical fall from standing height last night in which she struck her left face just below the left eye.  The patient was evaluated in urgent care today and sent to the emergency department for further evaluation and imaging.  On exam the patient is well-appearing.  Her neurologic  exam is nonfocal.  She has no diplopia.  There is some mild periorbital swelling and tenderness to the left maxillary area.  We will obtain a CT maxillofacial to rule out any facial bony fractures, as well as (based on shared decision making with the patient) a CT head given the patient's age although the suspicion for ICH is very low.  ----------------------------------------- 3:29 PM on 12/14/2017 -----------------------------------------  Negative imaging.  The patient is stable for discharge home.  I counseled her on the results of the work-up.  Return precautions given, and she expressed understanding. ____________________________________________   FINAL CLINICAL IMPRESSION(S) / ED DIAGNOSES  Final diagnoses:  Contusion of face, subsequent encounter      NEW MEDICATIONS STARTED DURING THIS VISIT:  New Prescriptions   No medications on file     Note:  This document was prepared using Dragon voice recognition software and may include unintentional dictation errors.   Arta Silence, MD 12/14/17 1530

## 2017-12-14 NOTE — ED Notes (Signed)
Patient transported to CT 

## 2018-01-07 ENCOUNTER — Ambulatory Visit (INDEPENDENT_AMBULATORY_CARE_PROVIDER_SITE_OTHER): Payer: Medicare Other | Admitting: Family Medicine

## 2018-01-07 ENCOUNTER — Encounter: Payer: Self-pay | Admitting: Family Medicine

## 2018-01-07 DIAGNOSIS — L239 Allergic contact dermatitis, unspecified cause: Secondary | ICD-10-CM

## 2018-01-07 MED ORDER — PREDNISONE 20 MG PO TABS: ORAL_TABLET | ORAL | 0 refills | Status: DC

## 2018-01-07 NOTE — Progress Notes (Signed)
Subjective:    Patient ID: Andrea Cooper, female    DOB: 04/20/1950, 67 y.o.   MRN: [id number]  HPI  66 year old female with new onset rash in last 3 days.   She noted the rash first on her lower abdomen, but it has now spread up to chest and neck   Very itchy.  Red splotchy rash, no pustules and blisters.  Using OT anti itch creams, took  oral benadryl.. no relief.  no lip swelling, no tounge swelling, no SOB, no wheezing.  No new exposures No new meds.  HX of psoriasis on scalp    Lab Results  Component Value Date   HGBA1C 5.9 06/01/2017     Review of Systems  Constitutional: Negative for fatigue and fever.  HENT: Negative for congestion.   Eyes: Negative for pain.  Respiratory: Negative for cough and shortness of breath.   Cardiovascular: Negative for chest pain, palpitations and leg swelling.  Gastrointestinal: Negative for abdominal pain.  Genitourinary: Negative for dysuria and vaginal bleeding.  Musculoskeletal: Negative for back pain.  Neurological: Negative for syncope, light-headedness and headaches.  Psychiatric/Behavioral: Negative for dysphoric mood.       Objective:   Physical Exam Constitutional:      General: She is not in acute distress.    Appearance: Normal appearance. She is well-developed. She is not ill-appearing or toxic-appearing.  HENT:     Head: Normocephalic.     Right Ear: Hearing, tympanic membrane, ear canal and external ear normal. Tympanic membrane is not erythematous, retracted or bulging.     Left Ear: Hearing, tympanic membrane, ear canal and external ear normal. Tympanic membrane is not erythematous, retracted or bulging.     Nose: No mucosal edema or rhinorrhea.     Right Sinus: No maxillary sinus tenderness or frontal sinus tenderness.     Left Sinus: No maxillary sinus tenderness or frontal sinus tenderness.     Mouth/Throat:     Pharynx: Uvula midline.  Eyes:     General: Lids are normal. Lids are everted, no foreign  bodies appreciated.     Conjunctiva/sclera: Conjunctivae normal.     Pupils: Pupils are equal, round, and reactive to light.  Neck:     Musculoskeletal: Normal range of motion and neck supple.     Thyroid: No thyroid mass or thyromegaly.     Vascular: No carotid bruit.     Trachea: Trachea normal.  Cardiovascular:     Rate and Rhythm: Normal rate and regular rhythm.     Pulses: Normal pulses.     Heart sounds: Normal heart sounds, S1 normal and S2 normal. No murmur. No friction rub. No gallop.   Pulmonary:     Effort: Pulmonary effort is normal. No tachypnea or respiratory distress.     Breath sounds: Normal breath sounds. No decreased breath sounds, wheezing, rhonchi or rales.  Abdominal:     General: Bowel sounds are normal.     Palpations: Abdomen is soft.     Tenderness: There is no abdominal tenderness.  Skin:    General: Skin is warm and dry.     Findings: Rash present. Rash is macular and papular. Rash is not pustular or vesicular.  Neurological:     Mental Status: She is alert.  Psychiatric:        Mood and Affect: Mood is not anxious or depressed.        Speech: Speech normal.        Behavior:  Behavior normal. Behavior is cooperative.        Thought Content: Thought content normal.        Judgment: Judgment normal.           Assessment & Plan:

## 2018-01-07 NOTE — Assessment & Plan Note (Signed)
Not clear exposure. Treat with oral prednisone , antihistmine and moisturizing cream.

## 2018-01-07 NOTE — Patient Instructions (Signed)
Start moisturizing twice daily with  Eucerin/cetaphil/ aveeno cream.  Complete prednisone taper.  Start claritin daily.  Call if not improving x 2 weeks.

## 2018-01-21 ENCOUNTER — Encounter: Payer: Self-pay | Admitting: Student

## 2018-01-22 ENCOUNTER — Ambulatory Visit
Admission: RE | Admit: 2018-01-22 | Discharge: 2018-01-22 | Disposition: A | Discharge status: Home / Self Care | Payer: Medicare Other | Attending: Internal Medicine | Admitting: Internal Medicine

## 2018-01-22 ENCOUNTER — Ambulatory Visit: Payer: Medicare Other | Admitting: Certified Registered"

## 2018-01-22 ENCOUNTER — Encounter: Payer: Self-pay | Admitting: *Deleted

## 2018-01-22 ENCOUNTER — Encounter
Admission: RE | Disposition: A | Discharge status: Home / Self Care | Payer: Self-pay | Source: Home / Self Care | Attending: Internal Medicine

## 2018-01-22 DIAGNOSIS — F329 Major depressive disorder, single episode, unspecified: Secondary | ICD-10-CM | POA: Diagnosis not present

## 2018-01-22 DIAGNOSIS — K5790 Diverticulosis of intestine, part unspecified, without perforation or abscess without bleeding: Secondary | ICD-10-CM | POA: Diagnosis not present

## 2018-01-22 DIAGNOSIS — F419 Anxiety disorder, unspecified: Secondary | ICD-10-CM | POA: Diagnosis not present

## 2018-01-22 DIAGNOSIS — Z79899 Other long term (current) drug therapy: Secondary | ICD-10-CM | POA: Insufficient documentation

## 2018-01-22 DIAGNOSIS — Z7982 Long term (current) use of aspirin: Secondary | ICD-10-CM | POA: Insufficient documentation

## 2018-01-22 DIAGNOSIS — K5730 Diverticulosis of large intestine without perforation or abscess without bleeding: Secondary | ICD-10-CM | POA: Insufficient documentation

## 2018-01-22 DIAGNOSIS — K219 Gastro-esophageal reflux disease without esophagitis: Secondary | ICD-10-CM | POA: Diagnosis not present

## 2018-01-22 DIAGNOSIS — E7800 Pure hypercholesterolemia, unspecified: Secondary | ICD-10-CM | POA: Diagnosis not present

## 2018-01-22 DIAGNOSIS — K640 First degree hemorrhoids: Secondary | ICD-10-CM | POA: Diagnosis not present

## 2018-01-22 DIAGNOSIS — K591 Functional diarrhea: Secondary | ICD-10-CM | POA: Insufficient documentation

## 2018-01-22 DIAGNOSIS — Z91040 Latex allergy status: Secondary | ICD-10-CM | POA: Insufficient documentation

## 2018-01-22 DIAGNOSIS — R197 Diarrhea, unspecified: Secondary | ICD-10-CM | POA: Diagnosis not present

## 2018-01-22 DIAGNOSIS — R1030 Lower abdominal pain, unspecified: Secondary | ICD-10-CM | POA: Diagnosis not present

## 2018-01-22 DIAGNOSIS — R109 Unspecified abdominal pain: Secondary | ICD-10-CM | POA: Diagnosis not present

## 2018-01-22 DIAGNOSIS — Z885 Allergy status to narcotic agent status: Secondary | ICD-10-CM | POA: Diagnosis not present

## 2018-01-22 DIAGNOSIS — E119 Type 2 diabetes mellitus without complications: Secondary | ICD-10-CM | POA: Diagnosis not present

## 2018-01-22 DIAGNOSIS — Z7984 Long term (current) use of oral hypoglycemic drugs: Secondary | ICD-10-CM | POA: Diagnosis not present

## 2018-01-22 DIAGNOSIS — E039 Hypothyroidism, unspecified: Secondary | ICD-10-CM | POA: Diagnosis not present

## 2018-01-22 DIAGNOSIS — Z85820 Personal history of malignant melanoma of skin: Secondary | ICD-10-CM | POA: Insufficient documentation

## 2018-01-22 DIAGNOSIS — K589 Irritable bowel syndrome without diarrhea: Secondary | ICD-10-CM | POA: Insufficient documentation

## 2018-01-22 DIAGNOSIS — Z888 Allergy status to other drugs, medicaments and biological substances status: Secondary | ICD-10-CM | POA: Diagnosis not present

## 2018-01-22 DIAGNOSIS — E785 Hyperlipidemia, unspecified: Secondary | ICD-10-CM | POA: Insufficient documentation

## 2018-01-22 DIAGNOSIS — I10 Essential (primary) hypertension: Secondary | ICD-10-CM | POA: Diagnosis not present

## 2018-01-22 DIAGNOSIS — R140 Abdominal distension (gaseous): Secondary | ICD-10-CM | POA: Diagnosis not present

## 2018-01-22 DIAGNOSIS — G4730 Sleep apnea, unspecified: Secondary | ICD-10-CM | POA: Diagnosis not present

## 2018-01-22 DIAGNOSIS — F418 Other specified anxiety disorders: Secondary | ICD-10-CM | POA: Diagnosis not present

## 2018-01-22 DIAGNOSIS — K648 Other hemorrhoids: Secondary | ICD-10-CM | POA: Diagnosis not present

## 2018-01-22 HISTORY — DX: Type 2 diabetes mellitus without complications: E11.9

## 2018-01-22 HISTORY — DX: Depression, unspecified: F32.A

## 2018-01-22 HISTORY — DX: Nausea with vomiting, unspecified: R11.2

## 2018-01-22 HISTORY — DX: Personal history of malignant melanoma of skin: Z85.820

## 2018-01-22 HISTORY — DX: Pure hypercholesterolemia, unspecified: E78.00

## 2018-01-22 HISTORY — DX: Anxiety disorder, unspecified: F41.9

## 2018-01-22 HISTORY — PX: COLONOSCOPY WITH PROPOFOL: SHX5780

## 2018-01-22 HISTORY — DX: Major depressive disorder, single episode, unspecified: F32.9

## 2018-01-22 HISTORY — DX: Essential (primary) hypertension: I10

## 2018-01-22 HISTORY — DX: Personal history of other specified conditions: Z87.898

## 2018-01-22 HISTORY — DX: Idiopathic sleep related nonobstructive alveolar hypoventilation: G47.34

## 2018-01-22 HISTORY — DX: Hypothyroidism, unspecified: E03.9

## 2018-01-22 HISTORY — DX: Other specified postprocedural states: Z98.890

## 2018-01-22 SURGERY — COLONOSCOPY WITH PROPOFOL
Anesthesia: General

## 2018-01-22 MED ORDER — PROPOFOL 10 MG/ML IV BOLUS
INTRAVENOUS | Status: DC | PRN
  Administered 2018-01-22: 50 mg via INTRAVENOUS

## 2018-01-22 MED ORDER — SODIUM CHLORIDE 0.9 % IV SOLN
INTRAVENOUS | Status: DC
  Administered 2018-01-22: 07:00:00 via INTRAVENOUS

## 2018-01-22 MED ORDER — PROPOFOL 500 MG/50ML IV EMUL
INTRAVENOUS | Status: AC
  Filled 2018-01-22: qty 50

## 2018-01-22 MED ORDER — PROPOFOL 500 MG/50ML IV EMUL
INTRAVENOUS | Status: DC | PRN
  Administered 2018-01-22: 150 ug/kg/min via INTRAVENOUS

## 2018-01-22 NOTE — Transfer of Care (Signed)
Immediate Anesthesia Transfer of Care Note  Patient: Andrea Cooper  Procedure(s) Performed: COLONOSCOPY WITH PROPOFOL (N/A )  Patient Location: PACU  Anesthesia Type:General  Level of Consciousness: drowsy  Airway & Oxygen Therapy: Patient Spontanous Breathing and Patient connected to nasal cannula oxygen  Post-op Assessment: Report given to RN  Post vital signs: Reviewed and stable  Last Vitals:  Vitals Value Taken Time  BP 103/67 01/22/2018  8:28 AM  Temp 36.1 C 01/22/2018  8:20 AM  Pulse 66 01/22/2018  8:30 AM  Resp 22 01/22/2018  8:30 AM  SpO2 94 % 01/22/2018  8:30 AM  Vitals shown include unvalidated device data.  Last Pain:  Vitals:   01/22/18 0820  TempSrc: Tympanic  PainSc:          Complications: No apparent anesthesia complications

## 2018-01-22 NOTE — H&P (Signed)
Outpatient short stay form Pre-procedure 01/22/2018 8:04 AM Andrea Cooper K. Alice Reichert, M.D.  Primary Physician:  Dr. Glori Bickers  Reason for visit: Functional diarrhea, abdominal pain  History of present illness:  68 y/o female presents for chronic diarrhea with negative stool studiens. 4-5 loose stools per day usually postprandial. No blood or weight loss. Has hx of diverticulosis on last colonoscopy in 2015.     Current Facility-Administered Medications:  .  0.9 %  sodium chloride infusion, , Intravenous, Continuous, Izadora Roehr, Benay Pike, MD  Medications Prior to Admission  Medication Sig Dispense Refill Last Dose  . aspirin EC 81 MG tablet Take 81 mg by mouth daily.   Past Week at Unknown time  . buPROPion (WELLBUTRIN XL) 300 MG 24 hr tablet Take 1 tablet (300 mg total) by mouth daily. 90 tablet 3 01/21/2018 at Unknown time  . calcium carbonate (OSCAL) 1500 (600 Ca) MG TABS tablet Take by mouth 2 (two) times daily with a meal.   01/21/2018 at Unknown time  . FLUoxetine (PROZAC) 40 MG capsule Take 2 capsules (80 mg total) by mouth daily. 180 capsule 3 01/21/2018 at Unknown time  . glucose blood (ONE TOUCH ULTRA TEST) test strip USE 1 STRIP TO CHECK GLUCOSE TWICE DAILY 200 each 0 01/21/2018 at Unknown time  . hydrochlorothiazide (HYDRODIURIL) 25 MG tablet Take 1 tablet (25 mg total) by mouth daily. 90 tablet 3 01/22/2018 at 0500  . hyoscyamine (LEVSIN SL) 0.125 MG SL tablet Place 0.125 mg under the tongue every 4 (four) hours as needed.    at prn  . levothyroxine (SYNTHROID, LEVOTHROID) 137 MCG tablet TAKE ONE TABLET BY MOUTH DAILY BEFORE BREAKFAST 90 tablet 0 01/21/2018 at Unknown time  . LORazepam (ATIVAN) 1 MG tablet Take 1 mg by mouth every 8 (eight) hours.   Past Week at Unknown time  . metFORMIN (GLUCOPHAGE) 500 MG tablet Take 1 tablet (500 mg total) by mouth 2 (two) times daily with a meal. 180 tablet 3 Past Week at Unknown time  . omeprazole (PRILOSEC) 20 MG capsule Take 1 capsule (20 mg total) by mouth daily.  90 capsule 3 01/21/2018 at Unknown time  . ONETOUCH DELICA LANCETS 27O MISC Check blood sugar twice a day and as directed. Dx E11.9 200 each 1 01/21/2018 at Unknown time  . potassium chloride (K-DUR,KLOR-CON) 10 MEQ tablet TAKE ONE TABLET BY MOUTH DAILY 90 tablet 0 01/21/2018 at Unknown time  . pravastatin (PRAVACHOL) 20 MG tablet Take 1 tablet (20 mg total) by mouth daily. 90 tablet 3 01/21/2018 at Unknown time  . propranolol ER (INDERAL LA) 80 MG 24 hr capsule Take 1 capsule (80 mg total) by mouth daily. 90 capsule 3 01/22/2018 at 0500  . fluticasone (FLONASE) 50 MCG/ACT nasal spray Place 2 sprays into both nostrils daily. (Patient not taking: Reported on 01/07/2018) 16 g 6 Not Taking  . predniSONE (DELTASONE) 20 MG tablet 3 tabs by mouth daily x 3 days, then 2 tabs by mouth daily x 2 days then 1 tab by mouth daily x 2 days (Patient not taking: Reported on 01/22/2018) 15 tablet 0 Completed Course at Unknown time     Allergies  Allergen Reactions  . Codeine Nausea And Vomiting    REACTION: nausea and vomiting  . Lisinopril     Cough/throat symptoms   . Latex Rash     Past Medical History:  Diagnosis Date  . Anxiety   . Depression   . Diabetes mellitus without complication ([place])   . GERD (  gastroesophageal reflux disease)   . History of anxiety   . History of cerebral aneurysm repair    history of cerebral aneurysm   . History of depression   . History of hyperglycemia   . History of hyperlipidemia   . History of hypothyroidism   . History of melanoma   . History of motion sickness   . History of skin cancer    hx of melanoma left lower arm, recurrance  . Hypercholesterolemia   . Hypertension   . Hypothyroidism   . IBS (irritable bowel syndrome)   . Nocturnal oxygen desaturation   . PONV (postoperative nausea and vomiting)   . Thyroid disease     Review of systems:  Otherwise negative.    Physical Exam  Gen: Alert, oriented. Appears stated age.  HEENT: [place]/AT. PERRLA. Lungs:  CTA, no wheezes. CV: RR nl S1, S2. Abd: soft, benign, no masses. BS+ Ext: No edema. Pulses 2+    Planned procedures: Proceed with colonoscopy. The patient understands the nature of the planned procedure, indications, risks, alternatives and potential complications including but not limited to bleeding, infection, perforation, damage to internal organs and possible oversedation/side effects from anesthesia. The patient agrees and gives consent to proceed.  Please refer to procedure notes for findings, recommendations and patient disposition/instructions.     Chanceler Pullin K. Alice Reichert, M.D. Gastroenterology 01/22/2018  8:04 AM

## 2018-01-22 NOTE — Anesthesia Preprocedure Evaluation (Addendum)
Anesthesia Evaluation  Patient identified by MRN, date of birth, ID band Patient awake    Reviewed: Allergy & Precautions, H&P , NPO status , Patient's Chart, lab work & pertinent test results  History of Anesthesia Complications (+) PONV and history of anesthetic complications  Airway Mallampati: III       Dental  (+) Chipped   Pulmonary sleep apnea , former smoker,           Cardiovascular hypertension,      Neuro/Psych PSYCHIATRIC DISORDERS Anxiety Depression Cerebral aneurysm    GI/Hepatic Neg liver ROS, GERD  ,  Endo/Other  diabetesHypothyroidism   Renal/GU negative Renal ROS  negative genitourinary   Musculoskeletal   Abdominal   Peds  Hematology negative hematology ROS (+)   Anesthesia Other Findings Past Medical History: No date: Anxiety No date: Depression No date: Diabetes mellitus without complication (HCC) No date: GERD (gastroesophageal reflux disease) No date: History of anxiety No date: History of cerebral aneurysm repair     Comment:  history of cerebral aneurysm  No date: History of depression No date: History of hyperglycemia No date: History of hyperlipidemia No date: History of hypothyroidism No date: History of melanoma No date: History of motion sickness No date: History of skin cancer     Comment:  hx of melanoma left lower arm, recurrance No date: Hypercholesterolemia No date: Hypertension No date: Hypothyroidism No date: IBS (irritable bowel syndrome) No date: Nocturnal oxygen desaturation No date: PONV (postoperative nausea and vomiting) No date: Thyroid disease  Past Surgical History: No date: ABDOMINAL HYSTERECTOMY 1966: APPENDECTOMY 07-29-14: BREAST BIOPSY; Left     Comment:   core bx FIBROCYSTIC CHANGES WITH FEW               MICROCALCIFICATIONS. 12/1997: cardiolite     Comment:  normal EF 68% 2008, 2009: CEREBRAL ANEURYSM REPAIR     Comment:  coiling at [place] No  date: EYE SURGERY     Comment:  RR 02/1998: FOOT SURGERY     Comment:  bilateral 10/17/2015: INTRAOPERATIVE ARTERIOGRAM 1992: MOHS SURGERY 2015: TOE SURGERY; Left 1964: TONSILLECTOMY No date: TOTAL ABDOMINAL HYSTERECTOMY W/ BILATERAL SALPINGOOPHORECTOMY  BMI    Body Mass Index:  36.58 kg/m      Reproductive/Obstetrics negative OB ROS                            Anesthesia Physical Anesthesia Plan  ASA: III  Anesthesia Plan: General   Post-op Pain Management:    Induction:   PONV Risk Score and Plan: Propofol infusion and TIVA  Airway Management Planned: Natural Airway and Nasal Cannula  Additional Equipment:   Intra-op Plan:   Post-operative Plan:   Informed Consent: I have reviewed the patients History and Physical, chart, labs and discussed the procedure including the risks, benefits and alternatives for the proposed anesthesia with the patient or authorized representative who has indicated his/her understanding and acceptance.   Dental Advisory Given  Plan Discussed with: Anesthesiologist, CRNA and Surgeon  Anesthesia Plan Comments:         Anesthesia Quick Evaluation

## 2018-01-22 NOTE — Anesthesia Post-op Follow-up Note (Signed)
Anesthesia QCDR form completed.        

## 2018-01-22 NOTE — Interval H&P Note (Signed)
History and Physical Interval Note:  01/22/2018 8:05 AM  Andrea Cooper  has presented today for surgery, with the diagnosis of DIARRHEA, ABD PAIN , BLOAT, CRAMPING,  The various methods of treatment have been discussed with the patient and family. After consideration of risks, benefits and other options for treatment, the patient has consented to  Procedure(s): COLONOSCOPY WITH PROPOFOL (N/A) as a surgical intervention .  The patient's history has been reviewed, patient examined, no change in status, stable for surgery.  I have reviewed the patient's chart and labs.  Questions were answered to the patient's satisfaction.     [place], [place]

## 2018-01-22 NOTE — Op Note (Signed)
[facility] Gastroenterology Patient Name: Andrea Cooper Procedure Date: 01/22/2018 8:06 AM MRN: [id number] Account #: [id number] Date of Birth: 09-19-1950 Admit Type: Outpatient Age: 68 Room: [facility] ENDO ROOM 2 Gender: Female Note Status: Finalized Procedure:            Colonoscopy Indications:          Lower abdominal pain, Functional diarrhea Providers:            Benay Pike. Alice Reichert MD, MD Referring MD:         Wynelle Fanny. Tower (Referring MD) Medicines:            Propofol per Anesthesia Complications:        No immediate complications. Procedure:            Pre-Anesthesia Assessment:                       - The risks and benefits of the procedure and the                        sedation options and risks were discussed with the                        patient. All questions were answered and informed                        consent was obtained.                       - Patient identification and proposed procedure were                        verified prior to the procedure by the nurse. The                        procedure was verified in the pre-procedure area in the                        procedure room.                       - ASA Grade Assessment: III - A patient with severe                        systemic disease.                       - After reviewing the risks and benefits, the patient                        was deemed in satisfactory condition to undergo the                        procedure.                       After obtaining informed consent, the colonoscope was                        passed under direct vision. Throughout the procedure,  the patient's blood pressure, pulse, and oxygen                        saturations were monitored continuously. The                        Colonoscope was introduced through the anus and                        advanced to the the cecum, identified by appendiceal                        orifice and  ileocecal valve. The colonoscopy was                        performed without difficulty. The patient tolerated the                        procedure well. The quality of the bowel preparation                        was excellent. The ileocecal valve, appendiceal                        orifice, and rectum were photographed. Findings:      The perianal and digital rectal examinations were normal. Pertinent       negatives include normal sphincter tone and no palpable rectal lesions.      A few small-mouthed diverticula were found in the sigmoid colon.      Normal mucosa was found in the entire colon. Biopsies for histology were       taken with a cold forceps from the random colon for evaluation of       microscopic colitis.      The exam was otherwise without abnormality.      Non-bleeding internal hemorrhoids were found during retroflexion. The       hemorrhoids were Grade I (internal hemorrhoids that do not prolapse). Impression:           - Diverticulosis in the sigmoid colon.                       - Normal mucosa in the entire examined colon. Biopsied.                       - The examination was otherwise normal. Recommendation:       - Patient has a contact number available for                        emergencies. The signs and symptoms of potential                        delayed complications were discussed with the patient.                        Return to normal activities tomorrow. Written discharge                        instructions were provided to the patient.                       -  Resume previous diet.                       - Continue present medications.                       - Await pathology results.                       - Repeat colonoscopy in 10 years for screening purposes.                       - Return to physician assistant in 2 months.                       - The findings and recommendations were discussed with                        the patient and their  family. Procedure Code(s):    --- Professional ---                       [phone], Colonoscopy, flexible; with biopsy, single or                        multiple Diagnosis Code(s):    --- Professional ---                       K57.30, Diverticulosis of large intestine without                        perforation or abscess without bleeding                       K59.1, Functional diarrhea                       R10.30, Lower abdominal pain, unspecified CPT copyright 2018 American Medical Association. All rights reserved. The codes documented in this report are preliminary and upon coder review may  be revised to meet current compliance requirements. Efrain Sella MD, MD 01/22/2018 8:26:33 AM This report has been signed electronically. Number of Addenda: 0 Note Initiated On: 01/22/2018 8:06 AM Scope Withdrawal Time: 0 hours 6 minutes 16 seconds  Total Procedure Duration: 0 hours 9 minutes 33 seconds       [facility]

## 2018-01-23 ENCOUNTER — Encounter: Payer: Self-pay | Admitting: Internal Medicine

## 2018-01-23 LAB — SURGICAL PATHOLOGY

## 2018-01-23 NOTE — Anesthesia Postprocedure Evaluation (Signed)
Anesthesia Post Note  Patient: Andrea Cooper  Procedure(s) Performed: COLONOSCOPY WITH PROPOFOL (N/A )  Patient location during evaluation: PACU Anesthesia Type: General Level of consciousness: awake and alert Pain management: pain level controlled Vital Signs Assessment: post-procedure vital signs reviewed and stable Respiratory status: spontaneous breathing, nonlabored ventilation and respiratory function stable Cardiovascular status: blood pressure returned to baseline and stable Postop Assessment: no apparent nausea or vomiting Anesthetic complications: no     Last Vitals:  Vitals:   01/22/18 0840 01/22/18 0850  BP: 112/89 (!) 133/94  Pulse: 65 65  Resp: 18 19  Temp:    SpO2: 97% 98%    Last Pain:  Vitals:   01/23/18 0745  TempSrc:   PainSc: 0-No pain                 Durenda Hurt

## 2018-01-24 ENCOUNTER — Telehealth: Payer: Self-pay | Admitting: Family Medicine

## 2018-01-24 DIAGNOSIS — E039 Hypothyroidism, unspecified: Secondary | ICD-10-CM

## 2018-01-24 DIAGNOSIS — E119 Type 2 diabetes mellitus without complications: Secondary | ICD-10-CM

## 2018-01-24 DIAGNOSIS — E7800 Pure hypercholesterolemia, unspecified: Secondary | ICD-10-CM

## 2018-01-24 DIAGNOSIS — I10 Essential (primary) hypertension: Secondary | ICD-10-CM

## 2018-01-24 NOTE — Telephone Encounter (Signed)
-----   Message from Eustace Pen, LPN sent at 09/20/7074  4:17 PM EST ----- Regarding: Labs 1/10 Lab orders needed. Thank you.  Insurance:  [employer]

## 2018-01-25 ENCOUNTER — Ambulatory Visit (INDEPENDENT_AMBULATORY_CARE_PROVIDER_SITE_OTHER): Payer: Medicare Other

## 2018-01-25 ENCOUNTER — Ambulatory Visit: Payer: Medicare Other

## 2018-01-25 VITALS — BP 130/98 | HR 67 | Temp 98.3°F | Ht 62.0 in | Wt 203.2 lb

## 2018-01-25 DIAGNOSIS — E7800 Pure hypercholesterolemia, unspecified: Secondary | ICD-10-CM

## 2018-01-25 DIAGNOSIS — E039 Hypothyroidism, unspecified: Secondary | ICD-10-CM

## 2018-01-25 DIAGNOSIS — E119 Type 2 diabetes mellitus without complications: Secondary | ICD-10-CM | POA: Diagnosis not present

## 2018-01-25 DIAGNOSIS — I10 Essential (primary) hypertension: Secondary | ICD-10-CM

## 2018-01-25 DIAGNOSIS — Z0000 Encounter for general adult medical examination without abnormal findings: Secondary | ICD-10-CM

## 2018-01-25 LAB — CBC WITH DIFFERENTIAL/PLATELET
Basophils Absolute: 0 10*3/uL (ref 0.0–0.1)
Basophils Relative: 0.7 % (ref 0.0–3.0)
EOS PCT: 1.5 % (ref 0.0–5.0)
Eosinophils Absolute: 0.1 10*3/uL (ref 0.0–0.7)
HEMATOCRIT: 42.4 % (ref 36.0–46.0)
Hemoglobin: 14.3 g/dL (ref 12.0–15.0)
Lymphocytes Relative: 35.2 % (ref 12.0–46.0)
Lymphs Abs: 1.9 10*3/uL (ref 0.7–4.0)
MCHC: 33.7 g/dL (ref 30.0–36.0)
MCV: 89.4 fl (ref 78.0–100.0)
Monocytes Absolute: 0.4 10*3/uL (ref 0.1–1.0)
Monocytes Relative: 8 % (ref 3.0–12.0)
Neutro Abs: 2.9 10*3/uL (ref 1.4–7.7)
Neutrophils Relative %: 54.6 % (ref 43.0–77.0)
Platelets: 257 10*3/uL (ref 150.0–400.0)
RBC: 4.74 Mil/uL (ref 3.87–5.11)
RDW: 13.9 % (ref 11.5–15.5)
WBC: 5.3 10*3/uL (ref 4.0–10.5)

## 2018-01-25 LAB — TSH: TSH: 2.94 u[IU]/mL (ref 0.35–4.50)

## 2018-01-25 LAB — COMPREHENSIVE METABOLIC PANEL
ALT: 24 U/L (ref 0–35)
AST: 17 U/L (ref 0–37)
Albumin: 4.1 g/dL (ref 3.5–5.2)
Alkaline Phosphatase: 43 U/L (ref 39–117)
BUN: 17 mg/dL (ref 6–23)
CHLORIDE: 102 meq/L (ref 96–112)
CO2: 30 mEq/L (ref 19–32)
Calcium: 10.4 mg/dL (ref 8.4–10.5)
Creatinine, Ser: 0.79 mg/dL (ref 0.40–1.20)
GFR: 76.67 mL/min (ref >60.00)
Glucose, Bld: 140 mg/dL — ABNORMAL HIGH (ref 70–99)
POTASSIUM: 4.6 meq/L (ref 3.5–5.1)
Sodium: 142 mEq/L (ref 135–145)
TOTAL PROTEIN: 6.9 g/dL (ref 6.0–8.3)
Total Bilirubin: 0.5 mg/dL (ref 0.2–1.2)

## 2018-01-25 LAB — MICROALBUMIN / CREATININE URINE RATIO
Creatinine,U: 138.8 mg/dL
Microalb Creat Ratio: 6.5 mg/g (ref 0.0–30.0)
Microalb, Ur: 9.1 mg/dL — ABNORMAL HIGH (ref 0.0–1.9)

## 2018-01-25 LAB — HEMOGLOBIN A1C: Hgb A1c MFr Bld: 6.3 % (ref 4.6–6.5)

## 2018-01-25 LAB — LIPID PANEL
Cholesterol: 171 mg/dL (ref 0–200)
HDL: 57.1 mg/dL (ref >39.00)
LDL Cholesterol: 95 mg/dL (ref 0–99)
NonHDL: 113.86
Total CHOL/HDL Ratio: 3
Triglycerides: 93 mg/dL (ref 0.0–149.0)
VLDL: 18.6 mg/dL (ref 0.0–40.0)

## 2018-01-25 NOTE — Progress Notes (Signed)
PCP notes:   Health maintenance:  A1C - completed Microalbumin - completed  Abnormal screenings:   Depression score: 9 Depression screen [facility] 2/9 01/25/2018 01/19/2017 01/19/2016 05/24/2015 07/14/2014  Decreased Interest 1 0 0 0 0  Down, Depressed, Hopeless 1 0 0 0 0  PHQ - 2 Score 2 0 0 0 0  Altered sleeping 2 0 - - -  Tired, decreased energy 1 0 - - -  Change in appetite 2 0 - - -  Feeling bad or failure about yourself  1 0 - - -  Trouble concentrating 1 0 - - -  Moving slowly or fidgety/restless 0 0 - - -  Suicidal thoughts 0 0 - - -  PHQ-9 Score 9 0 - - -  Difficult doing work/chores Not difficult at all Not difficult at all - - -   Patient concerns:   None  Nurse concerns:  None  Next PCP appt:   01/29/18 @ 0830  I reviewed health advisor's note, was available for consultation, and agree with documentation and plan. Loura Pardon MD

## 2018-01-25 NOTE — Progress Notes (Signed)
Subjective:   Andrea Cooper is a 68 y.o. female who presents for Medicare Annual (Subsequent) preventive examination.  Review of Systems:  N/A Cardiac Risk Factors include: advanced age (>52men, >68 women);diabetes mellitus;dyslipidemia;hypertension;obesity (BMI >30kg/m2)     Objective:     Vitals: BP (!) 130/98 (BP Location: Left Arm, Patient Position: Sitting, Cuff Size: Large)   Pulse 67   Temp 98.3 F (36.8 C) (Oral)   Ht 5\' 2"  (1.575 m) Comment: shoes  Wt 203 lb 4 oz (92.2 kg)   SpO2 93%   BMI 37.17 kg/m   Body mass index is 37.17 kg/m.  Advanced Directives 01/25/2018 01/22/2018 12/14/2017 01/19/2017 01/19/2016 05/24/2015  Does Patient Have a Medical Advance Directive? Yes Yes Yes No Yes No  Type of Paramedic of [place];Living will - Living will - [place];Living will -  Copy of [place] in Chart? No - copy requested - - - No - copy requested -  Would patient like information on creating a medical advance directive? - - No - Patient declined No - Patient declined - No - patient declined information    Tobacco Social History   Tobacco Use  Smoking Status Former Smoker  Smokeless Tobacco Never Used     Counseling given: No   Clinical Intake:  Pre-visit preparation completed: Yes  Pain : No/denies pain Pain Score: 0-No pain     Nutritional Status: BMI > 30  Obese Nutritional Risks: None Diabetes: Yes CBG done?: No Did pt. bring in CBG monitor from home?: No  How often do you need to have someone help you when you read instructions, pamphlets, or other written materials from your doctor or pharmacy?: 1 - Never What is the last grade level you completed in school?: 12th grade + some college  Interpreter Needed?: No  Comments: pt lives alone Information entered by :: LPinson, LPN  Past Medical History:  Diagnosis Date  . Anxiety   . Cataract 2019   corrected with surgery  . Depression   .  Diabetes mellitus without complication ([place])   . GERD (gastroesophageal reflux disease)   . History of anxiety   . History of cerebral aneurysm repair    history of cerebral aneurysm   . History of depression   . History of hyperglycemia   . History of hyperlipidemia   . History of hypothyroidism   . History of melanoma   . History of motion sickness   . History of skin cancer    hx of melanoma left lower arm, recurrance  . Hypercholesterolemia   . Hypertension   . Hypothyroidism   . IBS (irritable bowel syndrome)   . Nocturnal oxygen desaturation   . PONV (postoperative nausea and vomiting)   . Thyroid disease    Past Surgical History:  Procedure Laterality Date  . ABDOMINAL HYSTERECTOMY    . APPENDECTOMY  1966  . BREAST BIOPSY Left 07-29-14    core bx FIBROCYSTIC CHANGES WITH FEW MICROCALCIFICATIONS.  Marland Kitchen cardiolite  12/1997   normal EF 68%  . CATARACT EXTRACTION W/ INTRAOCULAR LENS  IMPLANT, BILATERAL Bilateral 04/2017   [facility]  . CEREBRAL ANEURYSM REPAIR  2008, 2009   coiling at [facility]  . COLONOSCOPY WITH PROPOFOL N/A 01/22/2018   Procedure: COLONOSCOPY WITH PROPOFOL;  Surgeon: [place], Benay Pike, MD;  Location: [facility] ENDOSCOPY;  Service: Gastroenterology;  Laterality: N/A;  . EYE SURGERY     RR  . FOOT SURGERY  02/1998   bilateral  . INTRAOPERATIVE ARTERIOGRAM  10/17/2015  . [place]  . TOE SURGERY Left 2015  . TONSILLECTOMY  1964  . TOTAL ABDOMINAL HYSTERECTOMY W/ BILATERAL SALPINGOOPHORECTOMY     Family History  Problem Relation Age of Onset  . Diabetes Father   . Depression Father   . CAD Father   . Diabetes Mother   . Diverticulosis Mother   . Adrenal disorder Mother   . Breast cancer Other        distant relatives (all on mother's side)  . Cerebral aneurysm Sister    Social History   Socioeconomic History  . Marital status: Married    Spouse name: Not on file  . Number of children: Not on file  . Years of education: Not on file  .  Highest education level: Not on file  Occupational History  . Not on file  Social Needs  . Financial resource strain: Not on file  . Food insecurity:    Worry: Not on file    Inability: Not on file  . Transportation needs:    Medical: Not on file    Non-medical: Not on file  Tobacco Use  . Smoking status: Former Research scientist (life sciences)  . Smokeless tobacco: Never Used  Substance and Sexual Activity  . Alcohol use: Yes    Alcohol/week: 1.0 standard drinks    Types: 1 Standard drinks or equivalent per week    Comment: occasional wine  . Drug use: No  . Sexual activity: Never  Lifestyle  . Physical activity:    Days per week: Not on file    Minutes per session: Not on file  . Stress: Not on file  Relationships  . Social connections:    Talks on phone: Not on file    Gets together: Not on file    Attends religious service: Not on file    Active member of club or organization: Not on file    Attends meetings of clubs or organizations: Not on file    Relationship status: Not on file  Other Topics Concern  . Not on file  Social History Narrative  . Not on file    Outpatient Encounter Medications as of 01/25/2018  Medication Sig  . aspirin EC 81 MG tablet Take 81 mg by mouth daily.  Marland Kitchen buPROPion (WELLBUTRIN XL) 300 MG 24 hr tablet Take 1 tablet (300 mg total) by mouth daily.  . calcium carbonate (OSCAL) 1500 (600 Ca) MG TABS tablet Take by mouth 2 (two) times daily with a meal.  . FLUoxetine (PROZAC) 40 MG capsule Take 2 capsules (80 mg total) by mouth daily.  Marland Kitchen glucose blood (ONE TOUCH ULTRA TEST) test strip USE 1 STRIP TO CHECK GLUCOSE TWICE DAILY  . hydrochlorothiazide (HYDRODIURIL) 25 MG tablet Take 1 tablet (25 mg total) by mouth daily.  . hyoscyamine (LEVSIN SL) 0.125 MG SL tablet Place 0.125 mg under the tongue every 4 (four) hours as needed.  Marland Kitchen levothyroxine (SYNTHROID, LEVOTHROID) 137 MCG tablet TAKE ONE TABLET BY MOUTH DAILY BEFORE BREAKFAST  . LORazepam (ATIVAN) 1 MG tablet Take 1  mg by mouth every 8 (eight) hours.  . metFORMIN (GLUCOPHAGE) 500 MG tablet Take 1 tablet (500 mg total) by mouth 2 (two) times daily with a meal.  . omeprazole (PRILOSEC) 20 MG capsule Take 1 capsule (20 mg total) by mouth daily.  [name] DELICA LANCETS 12X MISC Check blood sugar twice a day and as directed. Dx E11.9  .  potassium chloride (K-DUR,KLOR-CON) 10 MEQ tablet TAKE ONE TABLET BY MOUTH DAILY  . pravastatin (PRAVACHOL) 20 MG tablet Take 1 tablet (20 mg total) by mouth daily.  . propranolol ER (INDERAL LA) 80 MG 24 hr capsule Take 1 capsule (80 mg total) by mouth daily.  . [DISCONTINUED] fluticasone (FLONASE) 50 MCG/ACT nasal spray Place 2 sprays into both nostrils daily. (Patient not taking: Reported on 01/07/2018)  . [DISCONTINUED] predniSONE (DELTASONE) 20 MG tablet 3 tabs by mouth daily x 3 days, then 2 tabs by mouth daily x 2 days then 1 tab by mouth daily x 2 days (Patient not taking: Reported on 01/22/2018)   No facility-administered encounter medications on file as of 01/25/2018.     Activities of Daily Living In your present state of health, do you have any difficulty performing the following activities: 01/25/2018  Hearing? Y  Vision? N  Difficulty concentrating or making decisions? N  Walking or climbing stairs? N  Dressing or bathing? N  Doing errands, shopping? N  Preparing Food and eating ? N  Using the Toilet? N  In the past six months, have you accidently leaked urine? N  Do you have problems with loss of bowel control? Y  Managing your Medications? N  Managing your Finances? N  Housekeeping or managing your Housekeeping? N  Some recent data might be hidden    Patient Care Team: Tower, Wynelle Fanny, MD as PCP - General Byrnett, Forest Gleason, MD (General Surgery) Chucky May, MD as Consulting Physician (Psychiatry) Hoy Finlay, MD as Referring Physician (Neurosurgery) Christiane Ha as Consulting Physician (Optometry) Rolm Bookbinder, MD as  Consulting Physician (Dermatology)    Assessment:   This is a routine wellness examination for Cher[place].   Hearing Screening   125Hz  250Hz  500Hz  1000Hz  2000Hz  3000Hz  4000Hz  6000Hz  8000Hz   Right ear:   40 0 40  40    Left ear:   40 40 40  40    Vision Screening Comments: Vision exam in April 2019 @ [facility]   Exercise Activities and Dietary recommendations Current Exercise Habits: The patient does not participate in regular exercise at present, Exercise limited by: None identified  Goals    . Increase physical activity     Starting 01/26/2018,I will attempt to walk for 20 minutes 3 days per week.        Fall Risk Fall Risk  01/25/2018 01/19/2017 01/19/2016 07/15/2015 06/17/2015  Falls in the past year? 1 No Yes (No Data) Yes  Comment Thanksgiving 2019 fell as a result of wet leaves on sidewalk; injury to face; treatment in ER - - no falls since previous visit. no falls since last visit  Number falls in past yr: 0 - 2 or more - -  Injury with Fall? 1 - No - -  Risk Factor Category  - - High Fall Risk - -  Comment - - pt is being referred to PCP for further evaluation - -   Depression Screen PHQ 2/9 Scores 01/25/2018 01/19/2017 01/19/2016 05/24/2015  PHQ - 2 Score 2 0 0 0  PHQ- 9 Score 9 0 - -     Cognitive Function MMSE - Mini Mental State Exam 01/25/2018 01/19/2017 01/19/2016  Orientation to time 5 5 5   Orientation to Place 5 5 5   Registration 3 3 3   Attention/ Calculation 0 0 0  Recall 3 3 3   Language- name 2 objects 0 0 0  Language- repeat 1 1 1   Language- follow  3 step command 3 3 3   Language- read & follow direction 0 0 0  Write a sentence 0 0 0  Copy design 0 0 0  Total score 20 20 20      PLEASE NOTE: A Mini-Cog screen was completed. Maximum score is 20. A value of 0 denotes this part of [place] MMSE was not completed or the patient failed this part of the Mini-Cog screening.   Mini-Cog Screening Orientation to Time - Max 5 pts Orientation to Place - Max 5 pts Registration  - Max 3 pts Recall - Max 3 pts Language Repeat - Max 1 pts Language Follow 3 Step Command - Max 3 pts     Immunization History  Administered Date(s) Administered  . Influenza Whole 01/13/2009  . Influenza, High Dose Seasonal PF 11/06/2016, 10/24/2017  . Influenza,inj,Quad PF,6+ Mos 10/07/2013, 12/21/2015  . Pneumococcal Conjugate-13 07/14/2014, 06/20/2017  . Pneumococcal Polysaccharide-23 10/01/2008, 02/14/2016  . Td 01/08/1997, 04/16/2005  . Zoster 12/15/2013    Screening Tests Health Maintenance  Topic Date Due  . TETANUS/TDAP  04/15/2025 (Originally 04/17/2015)  . FOOT EXAM  01/26/2018  . OPHTHALMOLOGY EXAM  04/17/2018  . MAMMOGRAM  04/21/2018  . HEMOGLOBIN A1C  07/26/2018  . URINE MICROALBUMIN  01/26/2019  . COLONOSCOPY  01/23/2028  . INFLUENZA VACCINE  Completed  . DEXA SCAN  Completed  . Hepatitis C Screening  Completed  . PNA vac Low Risk Adult  Completed      Plan:     I have personally reviewed, addressed, and noted the following in the patient's chart:  A. Medical and social history B. Use of alcohol, tobacco or illicit drugs  C. Current medications and supplements D. Functional ability and status E.  Nutritional status F.  Physical activity G. Advance directives H. List of other physicians I.  Hospitalizations, surgeries, and ER visits in previous 12 months J.  [place] to include hearing, vision, cognitive, depression L. Referrals and appointments - none  In addition, I have reviewed and discussed with patient certain preventive protocols, quality metrics, and best practice recommendations. A written personalized care plan for preventive services as well as general preventive health recommendations were provided to patient.  See attached scanned questionnaire for additional information.   Signed,   Lindell Noe, MHA, BS, LPN Health Coach

## 2018-01-25 NOTE — Patient Instructions (Signed)
[name] , Thank you for taking time to come for your Medicare Wellness Visit. I appreciate your ongoing commitment to your health goals. Please review the following plan we discussed and let me know if I can assist you in the future.   These are the goals we discussed: Goals    . Increase physical activity     Starting 01/26/2018,I will attempt to walk for 20 minutes 3 days per week.        This is a list of the screening recommended for you and due dates:  Health Maintenance  Topic Date Due  . Tetanus Vaccine  04/15/2025*  . Complete foot exam   01/26/2018  . Eye exam for diabetics  04/17/2018  . Mammogram  04/21/2018  . Hemoglobin A1C  07/26/2018  . Urine Protein Check  01/26/2019  . Colon Cancer Screening  01/23/2028  . Flu Shot  Completed  . DEXA scan (bone density measurement)  Completed  .  Hepatitis C: One time screening is recommended by Center for Disease Control  (CDC) for  adults born from [birth year] through 1967.   Completed  . Pneumonia vaccines  Completed  *Topic was postponed. The date shown is not the original due date.   Preventive Care for Adults  A healthy lifestyle and preventive care can promote health and wellness. Preventive health guidelines for adults include the following key practices.  . A routine yearly physical is a good way to check with your health care provider about your health and preventive screening. It is a chance to share any concerns and updates on your health and to receive a thorough exam.  . Visit your dentist for a routine exam and preventive care every 6 months. Brush your teeth twice a day and floss once a day. Good oral hygiene prevents tooth decay and gum disease.  . The frequency of eye exams is based on your age, health, family medical history, use  of contact lenses, and other factors. Follow your health care provider's recommendations for frequency of eye exams.  . Eat a healthy diet. Foods like vegetables, fruits, whole grains,  low-fat dairy products, and lean protein foods contain the nutrients you need without too many calories. Decrease your intake of foods high in solid fats, added sugars, and salt. Eat the right amount of calories for you. Get information about a proper diet from your health care provider, if necessary.  . Regular physical exercise is one of the most important things you can do for your health. Most adults should get at least 150 minutes of moderate-intensity exercise (any activity that increases your heart rate and causes you to sweat) each week. In addition, most adults need muscle-strengthening exercises on 2 or more days a week.  Silver Sneakers may be a benefit available to you. To determine eligibility, you may visit the website: www.silversneakers.com or contact program at 774-348-8880 Mon-Fri between 8AM-8PM.   . Maintain a healthy weight. The body mass index (BMI) is a screening tool to identify possible weight problems. It provides an estimate of body fat based on height and weight. Your health care provider can find your BMI and can help you achieve or maintain a healthy weight.   For adults 20 years and older: ? A BMI below 18.5 is considered underweight. ? A BMI of 18.5 to 24.9 is normal. ? A BMI of 25 to 29.9 is considered overweight. ? A BMI of 30 and above is considered obese.   . Maintain normal  blood lipids and cholesterol levels by exercising and minimizing your intake of saturated fat. Eat a balanced diet with plenty of fruit and vegetables. Blood tests for lipids and cholesterol should begin at age 56 and be repeated every 5 years. If your lipid or cholesterol levels are high, you are over 50, or you are at high risk for heart disease, you may need your cholesterol levels checked more frequently. Ongoing high lipid and cholesterol levels should be treated with medicines if diet and exercise are not working.  . If you smoke, find out from your health care provider how to quit. If  you do not use tobacco, please do not start.  . If you choose to drink alcohol, please do not consume more than 2 drinks per day. One drink is considered to be 12 ounces (355 mL) of beer, 5 ounces (148 mL) of wine, or 1.5 ounces (44 mL) of liquor.  . If you are 19-9 years old, ask your health care provider if you should take aspirin to prevent strokes.  . Use sunscreen. Apply sunscreen liberally and repeatedly throughout the day. You should seek shade when your shadow is shorter than you. Protect yourself by wearing long sleeves, pants, a wide-brimmed hat, and sunglasses year round, whenever you are outdoors.  . Once a month, do a whole body skin exam, using a mirror to look at the skin on your back. Tell your health care provider of new moles, moles that have irregular borders, moles that are larger than a pencil eraser, or moles that have changed in shape or color.

## 2018-01-29 ENCOUNTER — Encounter: Payer: Medicare Other | Admitting: Family Medicine

## 2018-01-29 ENCOUNTER — Telehealth: Payer: Self-pay

## 2018-01-29 NOTE — Telephone Encounter (Signed)
Pt's appt was changed to 02/04/18. [facility] ins [facility] Pt is not sure which meds she will need refills on. Pt will cb with pharmacy info and which meds pt needs refills on. Nothing further needed at this time.

## 2018-01-29 NOTE — Telephone Encounter (Signed)
[facility] and [facility] list updated. Pt said does not need refills on meds until comes in for CPX on 02/04/18. Nothing further needed.

## 2018-02-04 ENCOUNTER — Ambulatory Visit (INDEPENDENT_AMBULATORY_CARE_PROVIDER_SITE_OTHER): Payer: Medicare Other | Admitting: Family Medicine

## 2018-02-04 ENCOUNTER — Encounter: Payer: Self-pay | Admitting: Family Medicine

## 2018-02-04 VITALS — BP 140/82 | HR 72 | Temp 97.6°F | Ht 62.0 in | Wt 205.8 lb

## 2018-02-04 DIAGNOSIS — E7800 Pure hypercholesterolemia, unspecified: Secondary | ICD-10-CM | POA: Diagnosis not present

## 2018-02-04 DIAGNOSIS — I10 Essential (primary) hypertension: Secondary | ICD-10-CM | POA: Diagnosis not present

## 2018-02-04 DIAGNOSIS — F418 Other specified anxiety disorders: Secondary | ICD-10-CM | POA: Diagnosis not present

## 2018-02-04 DIAGNOSIS — Z6837 Body mass index (BMI) 37.0-37.9, adult: Secondary | ICD-10-CM

## 2018-02-04 DIAGNOSIS — E039 Hypothyroidism, unspecified: Secondary | ICD-10-CM | POA: Diagnosis not present

## 2018-02-04 DIAGNOSIS — E119 Type 2 diabetes mellitus without complications: Secondary | ICD-10-CM

## 2018-02-04 MED ORDER — LEVOTHYROXINE SODIUM 137 MCG PO TABS: ORAL_TABLET | ORAL | 3 refills | Status: DC

## 2018-02-04 MED ORDER — PRAVASTATIN SODIUM 20 MG PO TABS: 20.0000 mg | ORAL_TABLET | Freq: Every day | ORAL | 3 refills | Status: DC

## 2018-02-04 MED ORDER — PROPRANOLOL HCL ER 80 MG PO CP24: 80.0000 mg | ORAL_CAPSULE | Freq: Every day | ORAL | 3 refills | Status: DC

## 2018-02-04 MED ORDER — OMEPRAZOLE 20 MG PO CPDR: 20.0000 mg | DELAYED_RELEASE_CAPSULE | Freq: Every day | ORAL | 3 refills | Status: DC

## 2018-02-04 MED ORDER — METFORMIN HCL 500 MG PO TABS: 500.0000 mg | ORAL_TABLET | Freq: Two times a day (BID) | ORAL | 3 refills | Status: DC

## 2018-02-04 MED ORDER — HYDROCHLOROTHIAZIDE 25 MG PO TABS: 25.0000 mg | ORAL_TABLET | Freq: Every day | ORAL | 3 refills | Status: DC

## 2018-02-04 MED ORDER — FLUOXETINE HCL 40 MG PO CAPS: 80.0000 mg | ORAL_CAPSULE | Freq: Every day | ORAL | 3 refills | Status: DC

## 2018-02-04 MED ORDER — BUPROPION HCL ER (XL) 300 MG PO TB24: 300.0000 mg | ORAL_TABLET | Freq: Every day | ORAL | 3 refills | Status: DC

## 2018-02-04 MED ORDER — POTASSIUM CHLORIDE CRYS ER 10 MEQ PO TBCR: 10.0000 meq | EXTENDED_RELEASE_TABLET | Freq: Every day | ORAL | 3 refills | Status: DC

## 2018-02-04 NOTE — Assessment & Plan Note (Signed)
Overall stable with prozac and wellbutrin  No longer sees psychiatrist

## 2018-02-04 NOTE — Assessment & Plan Note (Signed)
bp in fair control at this time  BP Readings from Last 1 Encounters:  02/04/18 140/82   No changes needed Most recent labs reviewed  Disc lifstyle change with low sodium diet and exercise

## 2018-02-04 NOTE — Assessment & Plan Note (Signed)
Disc goals for lipids and reasons to control them Rev last labs with pt Rev low sat fat diet in detail Continues pravastatin

## 2018-02-04 NOTE — Assessment & Plan Note (Signed)
Lab Results  Component Value Date   HGBA1C 6.3 01/25/2018   Up from 5.9 but not as high as expected with recent bad diet and lack of exercise  Plans to get back on track  Also will make her eye exam appt  Lab Results  Component Value Date   MICROALBUR 9.1 (H) 01/25/2018   (pt did have uti at the time she thinks this was done) Will continue to follow  Wt loss enc

## 2018-02-04 NOTE — Patient Instructions (Addendum)
Take care of yourself  Get back on track with healthy diet and exercise  Make sure you drink enough fluids   Continue the probiotic

## 2018-02-04 NOTE — Assessment & Plan Note (Signed)
Hypothyroidism  Pt has no clinical changes No change in energy level/ hair or skin/ edema and no tremor Lab Results  Component Value Date   TSH 2.94 01/25/2018

## 2018-02-04 NOTE — Progress Notes (Signed)
Subjective:    Patient ID: Andrea Cooper, female    DOB: December 29, 1950, 68 y.o.   MRN: [id number]  HPI Here for annual f/u of chronic health problems  Doing ok overall   Wt Readings from Last 3 Encounters:  02/04/18 205 lb 12 oz (93.3 kg)  01/25/18 203 lb 4 oz (92.2 kg)  01/22/18 200 lb (90.7 kg)  not optimal diet/exercise overall  37.63 kg/m   Had a fall at thanksgiving (slipped on wet leaves)  Is more careful now  Went to ED No bleeds (hx of aneurysm)  Rev amw  Depression score of 9 (chronic and stable overall)  Mammogram 4/19 Self breast exam -no new lumps / still has soreness at site of old cyst on L   Colonoscopy 1/20 10 y recall  Taking probiotics  Now she is having a hard time getting back to normal after the prep   dexa 3/18 - normal  She takes ca and vit D One fall No fractures   zostavax 11/15  bp is stable today (she gets uptight from traffic)  No cp or palpitations or headaches or edema  No side effects to medicines  BP Readings from Last 3 Encounters:  02/04/18 140/82  01/25/18 (!) 130/98  01/22/18 (!) 133/94     DM2 Lab Results  Component Value Date   HGBA1C 6.3 01/25/2018  this is up from 5.9 (holiday eating) Trying to get back to normal eating  Will walk dog for exercise  Metformin  Eye exam utd  Hypothyroidism  Pt has no clinical changes No change in energy level/ hair or skin/ edema and no tremor Lab Results  Component Value Date   TSH 2.94 01/25/2018     Hyperlipidemia Lab Results  Component Value Date   CHOL 171 01/25/2018   CHOL 173 01/19/2017   CHOL 172 01/19/2016   Lab Results  Component Value Date   HDL 57.10 01/25/2018   HDL 54.00 01/19/2017   HDL 58.00 01/19/2016   Lab Results  Component Value Date   LDLCALC 95 01/25/2018   [place] 95 01/19/2017   LDLCALC 101 (H) 01/19/2016   Lab Results  Component Value Date   TRIG 93.0 01/25/2018   TRIG 123.0 01/19/2017   TRIG 61.0 01/19/2016   Lab Results  Component  Value Date   CHOLHDL 3 01/25/2018   CHOLHDL 3 01/19/2017   CHOLHDL 3 01/19/2016   Lab Results  Component Value Date   LDLDIRECT 160.7 01/14/2007   LDLDIRECT 202.7 08/01/2006   pravastatin and diet   Lab Results  Component Value Date   CREATININE 0.79 01/25/2018   BUN 17 01/25/2018   NA 142 01/25/2018   K 4.6 01/25/2018   CL 102 01/25/2018   CO2 30 01/25/2018   Lab Results  Component Value Date   ALT 24 01/25/2018   AST 17 01/25/2018   ALKPHOS 43 01/25/2018   BILITOT 0.5 01/25/2018     Patient Active Problem List   Diagnosis Date Noted  . Allergic dermatitis 01/07/2018  . Diarrhea 11/07/2017  . Dysphagia 01/26/2017  . Hearing loss 02/14/2016  . Estrogen deficiency 02/14/2016  . GERD (gastroesophageal reflux disease) 08/13/2015  . Fat necrosis (segmental) of breast 08/27/2014  . Visit for screening mammogram 07/25/2014  . Encounter for Medicare annual wellness exam 07/14/2014  . Hypokalemia 12/15/2013  . Other screening mammogram 06/07/2011  . SLEEP APNEA 09/13/2009  . Obesity 01/13/2009  . POSTMENOPAUSAL STATUS 10/01/2008  . BACK PAIN, LUMBAR, WITH  RADICULOPATHY 08/10/2008  . SWEATING 07/15/2008  . Diabetes type 2, controlled ([place]) 05/22/2007  . TOBACCO USE, QUIT 05/22/2007  . CEREBRAL ANEURYSM 08/09/2006  . Hypothyroidism 07/25/2006  . Hyperlipidemia 07/25/2006  . ANXIETY 07/25/2006  . Depression with anxiety 07/25/2006  . Essential hypertension 07/25/2006  . IBS 07/25/2006  . OVEREATING 07/25/2006  . SKIN CANCER, HX OF 07/25/2006   Past Medical History:  Diagnosis Date  . Anxiety   . Cataract 2019   corrected with surgery  . Depression   . Diabetes mellitus without complication ([place])   . GERD (gastroesophageal reflux disease)   . History of anxiety   . History of cerebral aneurysm repair    history of cerebral aneurysm   . History of depression   . History of hyperglycemia   . History of hyperlipidemia   . History of hypothyroidism   . History  of melanoma   . History of motion sickness   . History of skin cancer    hx of melanoma left lower arm, recurrance  . Hypercholesterolemia   . Hypertension   . Hypothyroidism   . IBS (irritable bowel syndrome)   . Nocturnal oxygen desaturation   . PONV (postoperative nausea and vomiting)   . Thyroid disease    Past Surgical History:  Procedure Laterality Date  . ABDOMINAL HYSTERECTOMY    . APPENDECTOMY  1966  . BREAST BIOPSY Left 07-29-14    core bx FIBROCYSTIC CHANGES WITH FEW MICROCALCIFICATIONS.  Marland Kitchen cardiolite  12/1997   normal EF 68%  . CATARACT EXTRACTION W/ INTRAOCULAR LENS  IMPLANT, BILATERAL Bilateral 04/2017   [facility]  . CEREBRAL ANEURYSM REPAIR  2008, 2009   coiling at [facility]  . COLONOSCOPY WITH PROPOFOL N/A 01/22/2018   Procedure: COLONOSCOPY WITH PROPOFOL;  Surgeon: Toledo, Benay Pike, MD;  Location: [facility] ENDOSCOPY;  Service: Gastroenterology;  Laterality: N/A;  . EYE SURGERY     RR  . FOOT SURGERY  02/1998   bilateral  . INTRAOPERATIVE ARTERIOGRAM  10/17/2015  . [place]  . TOE SURGERY Left 2015  . TONSILLECTOMY  1964  . TOTAL ABDOMINAL HYSTERECTOMY W/ BILATERAL SALPINGOOPHORECTOMY     Social History   Tobacco Use  . Smoking status: Former Research scientist (life sciences)  . Smokeless tobacco: Never Used  Substance Use Topics  . Alcohol use: Yes    Alcohol/week: 1.0 standard drinks    Types: 1 Standard drinks or equivalent per week    Comment: occasional wine  . Drug use: No   Family History  Problem Relation Age of Onset  . Diabetes Father   . Depression Father   . CAD Father   . Diabetes Mother   . Diverticulosis Mother   . Adrenal disorder Mother   . Breast cancer Other        distant relatives (all on mother's side)  . Cerebral aneurysm Sister    Allergies  Allergen Reactions  . Codeine Nausea And Vomiting    REACTION: nausea and vomiting  . Lisinopril     Cough/throat symptoms   . Latex Rash   Current Outpatient Medications on File Prior to Visit   Medication Sig Dispense Refill  . aspirin EC 81 MG tablet Take 81 mg by mouth daily.    . calcium carbonate (OSCAL) 1500 (600 Ca) MG TABS tablet Take by mouth 2 (two) times daily with a meal.    . glucose blood (ONE TOUCH ULTRA TEST) test strip USE 1 STRIP TO CHECK GLUCOSE TWICE DAILY  200 each 0  . hyoscyamine (LEVSIN SL) 0.125 MG SL tablet Place 0.125 mg under the tongue every 4 (four) hours as needed.    Marland Kitchen LORazepam (ATIVAN) 1 MG tablet Take 1 mg by mouth every 8 (eight) hours.    [name] DELICA LANCETS 30S MISC Check blood sugar twice a day and as directed. Dx E11.9 200 each 1   No current facility-administered medications on file prior to visit.     Review of Systems  Constitutional: Negative for activity change, appetite change, fatigue, fever and unexpected weight change.  HENT: Negative for congestion, ear pain, rhinorrhea, sinus pressure and sore throat.   Eyes: Negative for pain, redness and visual disturbance.  Respiratory: Negative for cough, shortness of breath and wheezing.   Cardiovascular: Negative for chest pain and palpitations.  Gastrointestinal: Negative for abdominal pain, blood in stool, constipation and diarrhea.  Endocrine: Negative for polydipsia and polyuria.  Genitourinary: Negative for dysuria, frequency and urgency.  Musculoskeletal: Negative for arthralgias, back pain and myalgias.  Skin: Negative for pallor and rash.  Allergic/Immunologic: Negative for environmental allergies.  Neurological: Negative for dizziness, syncope and headaches.  Hematological: Negative for adenopathy. Does not bruise/bleed easily.  Psychiatric/Behavioral: Positive for dysphoric mood. Negative for decreased concentration. The patient is not nervous/anxious.        Objective:   Physical Exam Constitutional:      General: She is not in acute distress.    Appearance: She is well-developed. She is obese.  HENT:     Head: Normocephalic and atraumatic.     Right Ear: Tympanic  membrane, ear canal and external ear normal.     Left Ear: Tympanic membrane, ear canal and external ear normal.     Nose: Nose normal.     Mouth/Throat:     Pharynx: Oropharynx is clear.  Eyes:     General: No scleral icterus.    Conjunctiva/sclera: Conjunctivae normal.     Pupils: Pupils are equal, round, and reactive to light.  Neck:     Musculoskeletal: Normal range of motion and neck supple.     Thyroid: No thyromegaly.     Vascular: No carotid bruit or JVD.  Cardiovascular:     Rate and Rhythm: Normal rate and regular rhythm.     Pulses: Normal pulses.     Heart sounds: Normal heart sounds. No gallop.   Pulmonary:     Effort: Pulmonary effort is normal. No respiratory distress.     Breath sounds: Normal breath sounds. No wheezing.  Chest:     Chest wall: No tenderness.  Abdominal:     General: Bowel sounds are normal. There is no distension or abdominal bruit.     Palpations: Abdomen is soft. There is no mass.     Tenderness: There is no abdominal tenderness.  Musculoskeletal: Normal range of motion.        General: No tenderness.     Right lower leg: No edema.     Left lower leg: No edema.  Lymphadenopathy:     Cervical: No cervical adenopathy.  Skin:    General: Skin is warm and dry.     Capillary Refill: Capillary refill takes less than 2 seconds.     Coloration: Skin is not pale.     Findings: No erythema or rash.     Comments: Solar lentigines diffusely   Neurological:     General: No focal deficit present.     Mental Status: She is alert. Mental status is  at baseline.     Cranial Nerves: No cranial nerve deficit.     Motor: No abnormal muscle tone.     Coordination: Coordination normal.     Deep Tendon Reflexes: Reflexes are normal and symmetric. Reflexes normal.  Psychiatric:        Mood and Affect: Mood normal.           Assessment & Plan:   Problem List Items Addressed This Visit      Cardiovascular and Mediastinum   Essential hypertension -  Primary    bp in fair control at this time  BP Readings from Last 1 Encounters:  02/04/18 140/82   No changes needed Most recent labs reviewed  Disc lifstyle change with low sodium diet and exercise        Relevant Medications   hydrochlorothiazide (HYDRODIURIL) 25 MG tablet   pravastatin (PRAVACHOL) 20 MG tablet   propranolol ER (INDERAL LA) 80 MG 24 hr capsule     Endocrine   Hypothyroidism    Hypothyroidism  Pt has no clinical changes No change in energy level/ hair or skin/ edema and no tremor Lab Results  Component Value Date   TSH 2.94 01/25/2018          Relevant Medications   levothyroxine (SYNTHROID, LEVOTHROID) 137 MCG tablet   propranolol ER (INDERAL LA) 80 MG 24 hr capsule   Diabetes type 2, controlled ([facility])    Lab Results  Component Value Date   HGBA1C 6.3 01/25/2018   Up from 5.9 but not as high as expected with recent bad diet and lack of exercise  Plans to get back on track  Also will make her eye exam appt  Lab Results  Component Value Date   MICROALBUR 9.1 (H) 01/25/2018   (pt did have uti at the time she thinks this was done) Will continue to follow  Wt loss enc      Relevant Medications   metFORMIN (GLUCOPHAGE) 500 MG tablet   pravastatin (PRAVACHOL) 20 MG tablet     Other   Hyperlipidemia    Disc goals for lipids and reasons to control them Rev last labs with pt Rev low sat fat diet in detail Continues pravastatin        Relevant Medications   hydrochlorothiazide (HYDRODIURIL) 25 MG tablet   pravastatin (PRAVACHOL) 20 MG tablet   propranolol ER (INDERAL LA) 80 MG 24 hr capsule   Obesity    .Discussed how this problem influences overall health and the risks it imposes  Reviewed plan for weight loss with lower calorie diet (via better food choices and also portion control or program like weight watchers) and exercise building up to or more than 30 minutes 5 days per week including some aerobic activity         Relevant  Medications   metFORMIN (GLUCOPHAGE) 500 MG tablet   Depression with anxiety    Overall stable with prozac and wellbutrin  No longer sees psychiatrist       Relevant Medications   buPROPion (WELLBUTRIN XL) 300 MG 24 hr tablet   FLUoxetine (PROZAC) 40 MG capsule

## 2018-02-04 NOTE — Assessment & Plan Note (Signed)
Discussed how this problem influences overall health and the risks it imposes  Reviewed plan for weight loss with lower calorie diet (via better food choices and also portion control or program like weight watchers) and exercise building up to or more than 30 minutes 5 days per week including some aerobic activity    

## 2018-02-08 DIAGNOSIS — L821 Other seborrheic keratosis: Secondary | ICD-10-CM | POA: Diagnosis not present

## 2018-02-08 DIAGNOSIS — D2262 Melanocytic nevi of left upper limb, including shoulder: Secondary | ICD-10-CM | POA: Diagnosis not present

## 2018-02-08 DIAGNOSIS — D225 Melanocytic nevi of trunk: Secondary | ICD-10-CM | POA: Diagnosis not present

## 2018-02-08 DIAGNOSIS — L538 Other specified erythematous conditions: Secondary | ICD-10-CM | POA: Diagnosis not present

## 2018-02-08 DIAGNOSIS — D2272 Melanocytic nevi of left lower limb, including hip: Secondary | ICD-10-CM | POA: Diagnosis not present

## 2018-02-08 DIAGNOSIS — H5340 Unspecified visual field defects: Secondary | ICD-10-CM | POA: Diagnosis not present

## 2018-02-08 DIAGNOSIS — L820 Inflamed seborrheic keratosis: Secondary | ICD-10-CM | POA: Diagnosis not present

## 2018-02-08 DIAGNOSIS — D2261 Melanocytic nevi of right upper limb, including shoulder: Secondary | ICD-10-CM | POA: Diagnosis not present

## 2018-02-08 DIAGNOSIS — D2271 Melanocytic nevi of right lower limb, including hip: Secondary | ICD-10-CM | POA: Diagnosis not present

## 2018-06-16 ENCOUNTER — Telehealth: Payer: Self-pay | Admitting: Family Medicine

## 2018-06-20 MED ORDER — GLUCOSE BLOOD VI STRP: ORAL_STRIP | 1 refills | Status: DC

## 2018-06-20 NOTE — Telephone Encounter (Signed)
Patient stated that the pharmacy was going to be sending this prescription request back to [place]. They advised the patient that the code on the prescription was incorrect.   Patient's best #  [phone]

## 2018-06-20 NOTE — Telephone Encounter (Signed)
I assume the diagnosis code  I used E11.9 for diabetes type 2  Is that not correct ?  Do they need me to downgrade to testing once daily due to that dx?   Thanks

## 2018-06-20 NOTE — Telephone Encounter (Signed)
Called pharmacy and they said that we just need to put the diagnosis code on the Rx. Rx resent with Dr. Marliss Coots diagnosis code

## 2018-06-20 NOTE — Addendum Note (Signed)
Addended by: Tammi Sou on: 06/20/2018 10:39 AM   Modules accepted: Orders

## 2018-06-25 DIAGNOSIS — Z6833 Body mass index (BMI) 33.0-33.9, adult: Secondary | ICD-10-CM | POA: Diagnosis not present

## 2018-06-25 DIAGNOSIS — E119 Type 2 diabetes mellitus without complications: Secondary | ICD-10-CM | POA: Diagnosis not present

## 2018-06-25 DIAGNOSIS — E6609 Other obesity due to excess calories: Secondary | ICD-10-CM | POA: Diagnosis not present

## 2018-06-25 DIAGNOSIS — K219 Gastro-esophageal reflux disease without esophagitis: Secondary | ICD-10-CM | POA: Diagnosis not present

## 2018-06-25 DIAGNOSIS — K580 Irritable bowel syndrome with diarrhea: Secondary | ICD-10-CM | POA: Diagnosis not present

## 2018-06-25 DIAGNOSIS — E039 Hypothyroidism, unspecified: Secondary | ICD-10-CM | POA: Diagnosis not present

## 2018-07-01 DIAGNOSIS — E119 Type 2 diabetes mellitus without complications: Secondary | ICD-10-CM | POA: Diagnosis not present

## 2018-07-01 DIAGNOSIS — Z961 Presence of intraocular lens: Secondary | ICD-10-CM | POA: Diagnosis not present

## 2018-07-03 DIAGNOSIS — I671 Cerebral aneurysm, nonruptured: Secondary | ICD-10-CM | POA: Diagnosis not present

## 2018-07-04 ENCOUNTER — Other Ambulatory Visit: Payer: Medicare Other

## 2018-07-04 DIAGNOSIS — I671 Cerebral aneurysm, nonruptured: Secondary | ICD-10-CM | POA: Diagnosis not present

## 2018-07-04 DIAGNOSIS — Z9889 Other specified postprocedural states: Secondary | ICD-10-CM | POA: Diagnosis not present

## 2018-07-04 DIAGNOSIS — Z8679 Personal history of other diseases of the circulatory system: Secondary | ICD-10-CM | POA: Diagnosis not present

## 2018-07-05 ENCOUNTER — Telehealth: Payer: Self-pay

## 2018-07-05 NOTE — Telephone Encounter (Signed)
Left detailed VM w COVID screen and back door lab info Also asking come after 8am

## 2018-07-08 ENCOUNTER — Telehealth: Payer: Self-pay | Admitting: Family Medicine

## 2018-07-08 DIAGNOSIS — I10 Essential (primary) hypertension: Secondary | ICD-10-CM

## 2018-07-08 DIAGNOSIS — E7800 Pure hypercholesterolemia, unspecified: Secondary | ICD-10-CM

## 2018-07-08 DIAGNOSIS — E119 Type 2 diabetes mellitus without complications: Secondary | ICD-10-CM

## 2018-07-08 NOTE — Telephone Encounter (Signed)
Lab order

## 2018-07-09 ENCOUNTER — Other Ambulatory Visit: Payer: Self-pay

## 2018-07-09 ENCOUNTER — Other Ambulatory Visit (INDEPENDENT_AMBULATORY_CARE_PROVIDER_SITE_OTHER): Payer: Medicare Other

## 2018-07-09 ENCOUNTER — Ambulatory Visit: Payer: Medicare Other | Admitting: Family Medicine

## 2018-07-09 ENCOUNTER — Telehealth: Payer: Self-pay

## 2018-07-09 DIAGNOSIS — E7800 Pure hypercholesterolemia, unspecified: Secondary | ICD-10-CM | POA: Diagnosis not present

## 2018-07-09 DIAGNOSIS — I10 Essential (primary) hypertension: Secondary | ICD-10-CM | POA: Diagnosis not present

## 2018-07-09 DIAGNOSIS — E119 Type 2 diabetes mellitus without complications: Secondary | ICD-10-CM

## 2018-07-09 LAB — COMPREHENSIVE METABOLIC PANEL
ALT: 22 U/L (ref 0–35)
AST: 16 U/L (ref 0–37)
Albumin: 4.5 g/dL (ref 3.5–5.2)
Alkaline Phosphatase: 44 U/L (ref 39–117)
BUN: 22 mg/dL (ref 6–23)
CO2: 32 mEq/L (ref 19–32)
Calcium: 10.3 mg/dL (ref 8.4–10.5)
Chloride: 100 mEq/L (ref 96–112)
Creatinine, Ser: 0.85 mg/dL (ref 0.40–1.20)
GFR: 66.21 mL/min (ref >60.00)
Glucose, Bld: 154 mg/dL — ABNORMAL HIGH (ref 70–99)
Potassium: 5.1 mEq/L (ref 3.5–5.1)
Sodium: 140 mEq/L (ref 135–145)
Total Bilirubin: 0.5 mg/dL (ref 0.2–1.2)
Total Protein: 6.8 g/dL (ref 6.0–8.3)

## 2018-07-09 LAB — LIPID PANEL
Cholesterol: 198 mg/dL (ref 0–200)
HDL: 58 mg/dL (ref >39.00)
LDL Cholesterol: 111 mg/dL — ABNORMAL HIGH (ref 0–99)
NonHDL: 139.56
Total CHOL/HDL Ratio: 3
Triglycerides: 141 mg/dL (ref 0.0–149.0)
VLDL: 28.2 mg/dL (ref 0.0–40.0)

## 2018-07-09 LAB — HEMOGLOBIN A1C: Hgb A1c MFr Bld: 6 % (ref 4.6–6.5)

## 2018-07-09 LAB — HM DIABETES EYE EXAM

## 2018-07-09 NOTE — Telephone Encounter (Signed)
If someone can see her that would be great -otherwise I will see her as planned on 6/30 If she develops cp or sob or headache- go to ED

## 2018-07-09 NOTE — Telephone Encounter (Signed)
Pt went to neuro for an appt last week and her BP was 182/100 (in Care Everywhere), pt was advised then it was high so she bought a home monitor and has been checking it and it has constantly been around 180s/ 100-110 every time she checks BP. Pt doesn't have any new sxs. no light headed, no dizzy, no vision changes, no HA. Pt said she feels like her normal self. Pt wanted me to let Dr. Glori Bickers know but since PCP is out of the office also routed call to doctor in office. CB# [phone]

## 2018-07-09 NOTE — Telephone Encounter (Signed)
Appears patient has an appointment on 07/16/2018. Given how high her blood pressure is, it may be worth offering an earlier appointment to get examined and to consider medication.   Andrea Cooper

## 2018-07-09 NOTE — Telephone Encounter (Signed)
Pt left v/m wanting to speak with Shapale CMA about BP problems. Unable to reach pt by phone.

## 2018-07-10 ENCOUNTER — Encounter: Payer: Self-pay | Admitting: Family Medicine

## 2018-07-10 ENCOUNTER — Ambulatory Visit (INDEPENDENT_AMBULATORY_CARE_PROVIDER_SITE_OTHER): Payer: Medicare Other | Admitting: Family Medicine

## 2018-07-10 ENCOUNTER — Other Ambulatory Visit: Payer: Self-pay

## 2018-07-10 VITALS — BP 146/96 | HR 75 | Temp 97.7°F | Wt 204.2 lb

## 2018-07-10 DIAGNOSIS — I10 Essential (primary) hypertension: Secondary | ICD-10-CM

## 2018-07-10 NOTE — Telephone Encounter (Signed)
Left VM requesting pt to call the office back and to schedule an appt ASAP, and also left ER guidelines on VM also

## 2018-07-10 NOTE — Telephone Encounter (Signed)
Pt called back and scheduled an appt for today with Tor Netters, NP

## 2018-07-10 NOTE — Progress Notes (Signed)
Subjective:    Patient ID: Andrea Cooper, female    DOB: 06-Mar-1950, 68 y.o.   MRN: [id number]  HPI This is a 68 yo female who presents today with concerns for elevated blood pressure. She was at [facility] last week for routine imaging and had elevated blood pressure 170/110. She reports that she was not particularly anxious about the appointment and was feeling fine. She purchased an electronic wrist blood pressure monitor and has been trying to check her readings at home but has mostly gotten error messages. She is very sensitive to having her blood pressure taken on her upper arm as this causes pain and bruising. She admits to eating a lot of salt on her foods and drinking very little water.  She has not had any chest pain, SOB, DOE, headache, visual changes or LE edema. She has had her annual eye exam with in the last two weeks and everything was normal.  She has an appointment with her PCP next week.   Past Medical History:  Diagnosis Date  . Anxiety   . Cataract 2019   corrected with surgery  . Depression   . Diabetes mellitus without complication ([place])   . GERD (gastroesophageal reflux disease)   . History of anxiety   . History of cerebral aneurysm repair    history of cerebral aneurysm   . History of depression   . History of hyperglycemia   . History of hyperlipidemia   . History of hypothyroidism   . History of melanoma   . History of motion sickness   . History of skin cancer    hx of melanoma left lower arm, recurrance  . Hypercholesterolemia   . Hypertension   . Hypothyroidism   . IBS (irritable bowel syndrome)   . Nocturnal oxygen desaturation   . PONV (postoperative nausea and vomiting)   . Thyroid disease    Past Surgical History:  Procedure Laterality Date  . ABDOMINAL HYSTERECTOMY    . APPENDECTOMY  1966  . BREAST BIOPSY Left 07-29-14    core bx FIBROCYSTIC CHANGES WITH FEW MICROCALCIFICATIONS.  Marland Kitchen cardiolite  12/1997   normal EF 68%  . CATARACT EXTRACTION  W/ INTRAOCULAR LENS  IMPLANT, BILATERAL Bilateral 04/2017   [facility]  . CEREBRAL ANEURYSM REPAIR  2008, 2009   coiling at [facility]  . COLONOSCOPY WITH PROPOFOL N/A 01/22/2018   Procedure: COLONOSCOPY WITH PROPOFOL;  Surgeon: Toledo, Benay Pike, MD;  Location: [facility] ENDOSCOPY;  Service: Gastroenterology;  Laterality: N/A;  . EYE SURGERY     RR  . FOOT SURGERY  02/1998   bilateral  . INTRAOPERATIVE ARTERIOGRAM  10/17/2015  . [place]  . TOE SURGERY Left 2015  . TONSILLECTOMY  1964  . TOTAL ABDOMINAL HYSTERECTOMY W/ BILATERAL SALPINGOOPHORECTOMY     Family History  Problem Relation Age of Onset  . Diabetes Father   . Depression Father   . CAD Father   . Diabetes Mother   . Diverticulosis Mother   . Adrenal disorder Mother   . Breast cancer Other        distant relatives (all on mother's side)  . Cerebral aneurysm Sister    Social History   Tobacco Use  . Smoking status: Former Research scientist (life sciences)  . Smokeless tobacco: Never Used  Substance Use Topics  . Alcohol use: Yes    Alcohol/week: 1.0 standard drinks    Types: 1 Standard drinks or equivalent per week    Comment: occasional wine  .  Drug use: No      Review of Systems Per HPI    Objective:   Physical Exam Vitals signs reviewed.  Constitutional:      General: She is not in acute distress.    Appearance: Normal appearance. She is obese. She is not ill-appearing, toxic-appearing or diaphoretic.  HENT:     Head: Normocephalic and atraumatic.  Eyes:     Conjunctiva/sclera: Conjunctivae normal.  Cardiovascular:     Rate and Rhythm: Normal rate.  Pulmonary:     Effort: Pulmonary effort is normal.  Musculoskeletal:     Right lower leg: No edema.     Left lower leg: No edema.  Skin:    General: Skin is warm and dry.  Neurological:     Mental Status: She is alert and oriented to person, place, and time.  Psychiatric:        Mood and Affect: Mood normal.        Behavior: Behavior normal.        Thought Content:  Thought content normal.        Judgment: Judgment normal.       BP (!) 146/96 (BP Location: Left Arm, Patient Position: Sitting, Cuff Size: Large)   Pulse 75   Temp 97.7 F (36.5 C) (Tympanic)   Wt 204 lb 4 oz (92.6 kg)   SpO2 95%   BMI 37.36 kg/m  Wt Readings from Last 3 Encounters:  07/10/18 204 lb 4 oz (92.6 kg)  02/04/18 205 lb 12 oz (93.3 kg)  01/25/18 203 lb 4 oz (92.2 kg)   BP Readings from Last 3 Encounters:  07/10/18 (!) 146/96  02/04/18 140/82  01/25/18 (!) 130/98       Assessment & Plan:  1. Essential hypertension - mildly elevated in office, when I attempted to recheck her BP, she was very sensitive to having the cuff wrapped around her arm and pumped to pressure, I sense this may be increasing her anxiety and likely her blood pressure to have it checked.  - I advised her to return her wrist cuff as the reading was not consistent with what was obtained in the office. I do not think she could tolerate an upper arm automatic cuff and we discussed this - Her nephew works at a nearby fire station and she can have her blood pressure checked there as needed - continue HCTZ, propranolol and follow up with PCP next week - provided her information about the DASH diet and encouraged her to decrease added salt and increase water    Clarene Reamer, FNP-BC  [facility], [place]  07/11/2018 8:06 AM

## 2018-07-10 NOTE — Patient Instructions (Signed)
Your blood pressure is slightly elevated.   Please watch your salt intake.   Increase water until your urine is light yellow    DASH Eating Plan DASH stands for "Dietary Approaches to Stop Hypertension." The DASH eating plan is a healthy eating plan that has been shown to reduce high blood pressure (hypertension). It may also reduce your risk for type 2 diabetes, heart disease, and stroke. The DASH eating plan may also help with weight loss. What are tips for following this plan?  General guidelines  Avoid eating more than 2,300 mg (milligrams) of salt (sodium) a day. If you have hypertension, you may need to reduce your sodium intake to 1,500 mg a day.  Limit alcohol intake to no more than 1 drink a day for nonpregnant women and 2 drinks a day for men. One drink equals 12 oz of beer, 5 oz of wine, or 1 oz of hard liquor.  Work with your health care provider to maintain a healthy body weight or to lose weight. Ask what an ideal weight is for you.  Get at least 30 minutes of exercise that causes your heart to beat faster (aerobic exercise) most days of the week. Activities may include walking, swimming, or biking.  Work with your health care provider or diet and nutrition specialist (dietitian) to adjust your eating plan to your individual calorie needs. Reading food labels   Check food labels for the amount of sodium per serving. Choose foods with less than 5 percent of the Daily Value of sodium. Generally, foods with less than 300 mg of sodium per serving fit into this eating plan.  To find whole grains, look for the word "whole" as the first word in the ingredient list. Shopping  Buy products labeled as "low-sodium" or "no salt added."  Buy fresh foods. Avoid canned foods and premade or frozen meals. Cooking  Avoid adding salt when cooking. Use salt-free seasonings or herbs instead of table salt or sea salt. Check with your health care provider or pharmacist before using salt  substitutes.  Do not fry foods. Cook foods using healthy methods such as baking, boiling, grilling, and broiling instead.  Cook with heart-healthy oils, such as olive, canola, soybean, or sunflower oil. Meal planning  Eat a balanced diet that includes: ? 5 or more servings of fruits and vegetables each day. At each meal, try to fill half of your plate with fruits and vegetables. ? Up to 6-8 servings of whole grains each day. ? Less than 6 oz of lean meat, poultry, or fish each day. A 3-oz serving of meat is about the same size as a deck of cards. One egg equals 1 oz. ? 2 servings of low-fat dairy each day. ? A serving of nuts, seeds, or beans 5 times each week. ? Heart-healthy fats. Healthy fats called Omega-3 fatty acids are found in foods such as flaxseeds and coldwater fish, like sardines, salmon, and mackerel.  Limit how much you eat of the following: ? Canned or prepackaged foods. ? Food that is high in trans fat, such as fried foods. ? Food that is high in saturated fat, such as fatty meat. ? Sweets, desserts, sugary drinks, and other foods with added sugar. ? Full-fat dairy products.  Do not salt foods before eating.  Try to eat at least 2 vegetarian meals each week.  Eat more home-cooked food and less restaurant, buffet, and fast food.  When eating at a restaurant, ask that your food be prepared  with less salt or no salt, if possible. What foods are recommended? The items listed may not be a complete list. Talk with your dietitian about what dietary choices are best for you. Grains Whole-grain or whole-wheat bread. Whole-grain or whole-wheat pasta. Brown rice. [place]. Bulgur. Whole-grain and low-sodium cereals. Pita bread. Low-fat, low-sodium crackers. Whole-wheat flour tortillas. Vegetables Fresh or frozen vegetables (raw, steamed, roasted, or grilled). Low-sodium or reduced-sodium tomato and vegetable juice. Low-sodium or reduced-sodium tomato sauce and tomato  paste. Low-sodium or reduced-sodium canned vegetables. Fruits All fresh, dried, or frozen fruit. Canned fruit in natural juice (without added sugar). Meat and other protein foods Skinless chicken or [place]. Ground chicken or [place]. Pork with fat trimmed off. Fish and seafood. Egg whites. Dried beans, peas, or lentils. Unsalted nuts, nut butters, and seeds. Unsalted canned beans. Lean cuts of beef with fat trimmed off. Low-sodium, lean deli meat. Dairy Low-fat (1%) or fat-free (skim) milk. Fat-free, low-fat, or reduced-fat cheeses. Nonfat, low-sodium ricotta or cottage cheese. Low-fat or nonfat yogurt. Low-fat, low-sodium cheese. Fats and oils Soft margarine without trans fats. Vegetable oil. Low-fat, reduced-fat, or light mayonnaise and salad dressings (reduced-sodium). Canola, safflower, olive, soybean, and sunflower oils. Avocado. Seasoning and other foods Herbs. Spices. Seasoning mixes without salt. Unsalted popcorn and pretzels. Fat-free sweets. What foods are not recommended? The items listed may not be a complete list. Talk with your dietitian about what dietary choices are best for you. Grains Baked goods made with fat, such as croissants, muffins, or some breads. Dry pasta or rice meal packs. Vegetables Creamed or fried vegetables. Vegetables in a cheese sauce. Regular canned vegetables (not low-sodium or reduced-sodium). Regular canned tomato sauce and paste (not low-sodium or reduced-sodium). Regular tomato and vegetable juice (not low-sodium or reduced-sodium). Angie Fava. Olives. Fruits Canned fruit in a light or heavy syrup. Fried fruit. Fruit in cream or butter sauce. Meat and other protein foods Fatty cuts of meat. Ribs. Fried meat. Berniece Salines. Sausage. Bologna and other processed lunch meats. Salami. Fatback. Hotdogs. Bratwurst. Salted nuts and seeds. Canned beans with added salt. Canned or smoked fish. Whole eggs or egg yolks. Chicken or [place] with skin. Dairy Whole or 2% milk,  cream, and half-and-half. Whole or full-fat cream cheese. Whole-fat or sweetened yogurt. Full-fat cheese. Nondairy creamers. Whipped toppings. Processed cheese and cheese spreads. Fats and oils Butter. Stick margarine. Lard. Shortening. Ghee. Bacon fat. Tropical oils, such as coconut, palm kernel, or palm oil. Seasoning and other foods Salted popcorn and pretzels. Onion salt, garlic salt, seasoned salt, table salt, and sea salt. Worcestershire sauce. Tartar sauce. Barbecue sauce. Teriyaki sauce. Soy sauce, including reduced-sodium. Steak sauce. Canned and packaged gravies. Fish sauce. Oyster sauce. Cocktail sauce. Horseradish that you find on the shelf. Ketchup. Mustard. Meat flavorings and tenderizers. Bouillon cubes. Hot sauce and Tabasco sauce. Premade or packaged marinades. Premade or packaged taco seasonings. Relishes. Regular salad dressings. Where to find more information:  National Heart, Lung, and [place]: https://wilson-eaton.com/  American Heart Association: www.heart.org Summary  The DASH eating plan is a healthy eating plan that has been shown to reduce high blood pressure (hypertension). It may also reduce your risk for type 2 diabetes, heart disease, and stroke.  With the DASH eating plan, you should limit salt (sodium) intake to 2,300 mg a day. If you have hypertension, you may need to reduce your sodium intake to 1,500 mg a day.  When on the DASH eating plan, aim to eat more fresh fruits and vegetables, whole grains, lean  proteins, low-fat dairy, and heart-healthy fats.  Work with your health care provider or diet and nutrition specialist (dietitian) to adjust your eating plan to your individual calorie needs. This information is not intended to replace advice given to you by your health care provider. Make sure you discuss any questions you have with your health care provider. Document Released: 12/22/2010 Document Revised: 12/27/2015 Document Reviewed: 12/27/2015 Elsevier  Interactive Patient Education  2019 [employer].

## 2018-07-11 ENCOUNTER — Encounter: Payer: Self-pay | Admitting: Family Medicine

## 2018-07-16 ENCOUNTER — Other Ambulatory Visit: Payer: Self-pay

## 2018-07-16 ENCOUNTER — Ambulatory Visit (INDEPENDENT_AMBULATORY_CARE_PROVIDER_SITE_OTHER): Payer: Medicare Other | Admitting: Family Medicine

## 2018-07-16 ENCOUNTER — Encounter: Payer: Self-pay | Admitting: Family Medicine

## 2018-07-16 VITALS — BP 135/88 | HR 73 | Temp 96.1°F | Ht 62.0 in | Wt 202.5 lb

## 2018-07-16 DIAGNOSIS — I10 Essential (primary) hypertension: Secondary | ICD-10-CM

## 2018-07-16 DIAGNOSIS — E119 Type 2 diabetes mellitus without complications: Secondary | ICD-10-CM | POA: Diagnosis not present

## 2018-07-16 DIAGNOSIS — Z6837 Body mass index (BMI) 37.0-37.9, adult: Secondary | ICD-10-CM | POA: Diagnosis not present

## 2018-07-16 DIAGNOSIS — I671 Cerebral aneurysm, nonruptured: Secondary | ICD-10-CM

## 2018-07-16 DIAGNOSIS — E7800 Pure hypercholesterolemia, unspecified: Secondary | ICD-10-CM | POA: Diagnosis not present

## 2018-07-16 NOTE — Assessment & Plan Note (Signed)
LDL is up a bit with pravastatin  Disc goals for lipids and reasons to control them Rev last labs with pt Rev low sat fat diet in detail Enc to watch diet more carefully for fats (trans)

## 2018-07-16 NOTE — Assessment & Plan Note (Signed)
Discussed how this problem influences overall health and the risks it imposes  Reviewed plan for weight loss with lower calorie diet (via better food choices and also portion control or program like weight watchers) and exercise building up to or more than 30 minutes 5 days per week including some aerobic activity   Commended on wt loss so far  

## 2018-07-16 NOTE — Assessment & Plan Note (Signed)
Doing well with recent neuro surgery f/u

## 2018-07-16 NOTE — Assessment & Plan Note (Signed)
Improved Lab Results  Component Value Date   HGBA1C 6.0 07/09/2018   Enc her to keep working on healthy diet and exercise  Continue good eye and foot care  Taking statin  disc imp of low glycemic diet and wt loss to prevent DM2

## 2018-07-16 NOTE — Progress Notes (Signed)
Subjective:    Patient ID: Andrea Cooper, female    DOB: Mar 20, 1950, 68 y.o.   MRN: [id number]  HPI Here for 6 mo f/u of chronic health problems   Wt Readings from Last 3 Encounters:  07/16/18 202 lb 8 oz (91.9 kg)  07/10/18 204 lb 4 oz (92.6 kg)  02/04/18 205 lb 12 oz (93.3 kg)  down 3 lb from January Is eating quite a bit better now - getting tired of cooking   37.04 kg/m   bp is stable today  Re check BP: 135/88   No cp or palpitations or headaches or edema  No side effects to medicines  BP Readings from Last 3 Encounters:  07/16/18 (!) 142/86  07/10/18 (!) 146/96  02/04/18 140/82   she walks early in the park - to get some exercise   She gets pain when her arm is squeezed and she gets very nervous  It was very high at [facility] for her neuro surg  Pulse Readings from Last 3 Encounters:  07/16/18 73  07/10/18 75  02/04/18 72    DM2 Lab Results  Component Value Date   HGBA1C 6.0 07/09/2018  wonderful! This is down from 6.3 last time in January  Eye care-utd last week  Foot care-good   Statin -pravastatin  Drinking more wine lately - she watches this   Lab Results  Component Value Date   CHOL 198 07/09/2018   CHOL 171 01/25/2018   CHOL 173 01/19/2017   Lab Results  Component Value Date   HDL 58.00 07/09/2018   HDL 57.10 01/25/2018   HDL 54.00 01/19/2017   Lab Results  Component Value Date   LDLCALC 111 (H) 07/09/2018   [place] 95 01/25/2018   [place] 95 01/19/2017   Lab Results  Component Value Date   TRIG 141.0 07/09/2018   TRIG 93.0 01/25/2018   TRIG 123.0 01/19/2017   Lab Results  Component Value Date   CHOLHDL 3 07/09/2018   CHOLHDL 3 01/25/2018   CHOLHDL 3 01/19/2017   Lab Results  Component Value Date   LDLDIRECT 160.7 01/14/2007   LDLDIRECT 202.7 08/01/2006  ? Why her LDL went up a bit  More fried food lately   Colonoscopy was good  Also re check from neuro surg was good   Patient Active Problem List   Diagnosis Date Noted   . Allergic dermatitis 01/07/2018  . Diarrhea 11/07/2017  . Dysphagia 01/26/2017  . Hearing loss 02/14/2016  . Estrogen deficiency 02/14/2016  . GERD (gastroesophageal reflux disease) 08/13/2015  . Fat necrosis (segmental) of breast 08/27/2014  . Visit for screening mammogram 07/25/2014  . Encounter for Medicare annual wellness exam 07/14/2014  . Hypokalemia 12/15/2013  . Other screening mammogram 06/07/2011  . SLEEP APNEA 09/13/2009  . Obesity 01/13/2009  . POSTMENOPAUSAL STATUS 10/01/2008  . BACK PAIN, LUMBAR, WITH RADICULOPATHY 08/10/2008  . SWEATING 07/15/2008  . Diabetes type 2, controlled ([place]) 05/22/2007  . TOBACCO USE, QUIT 05/22/2007  . CEREBRAL ANEURYSM 08/09/2006  . Hypothyroidism 07/25/2006  . Hyperlipidemia 07/25/2006  . ANXIETY 07/25/2006  . Depression with anxiety 07/25/2006  . Essential hypertension 07/25/2006  . IBS 07/25/2006  . OVEREATING 07/25/2006  . SKIN CANCER, HX OF 07/25/2006   Past Medical History:  Diagnosis Date  . Anxiety   . Cataract 2019   corrected with surgery  . Depression   . Diabetes mellitus without complication ([place])   . GERD (gastroesophageal reflux disease)   . History of anxiety   .  History of cerebral aneurysm repair    history of cerebral aneurysm   . History of depression   . History of hyperglycemia   . History of hyperlipidemia   . History of hypothyroidism   . History of melanoma   . History of motion sickness   . History of skin cancer    hx of melanoma left lower arm, recurrance  . Hypercholesterolemia   . Hypertension   . Hypothyroidism   . IBS (irritable bowel syndrome)   . Nocturnal oxygen desaturation   . PONV (postoperative nausea and vomiting)   . Thyroid disease    Past Surgical History:  Procedure Laterality Date  . ABDOMINAL HYSTERECTOMY    . APPENDECTOMY  1966  . BREAST BIOPSY Left 07-29-14    core bx FIBROCYSTIC CHANGES WITH FEW MICROCALCIFICATIONS.  Marland Kitchen cardiolite  12/1997   normal EF 68%  .  CATARACT EXTRACTION W/ INTRAOCULAR LENS  IMPLANT, BILATERAL Bilateral 04/2017   [facility]  . CEREBRAL ANEURYSM REPAIR  2008, 2009   coiling at [facility]  . COLONOSCOPY WITH PROPOFOL N/A 01/22/2018   Procedure: COLONOSCOPY WITH PROPOFOL;  Surgeon: Toledo, Benay Pike, MD;  Location: [facility] ENDOSCOPY;  Service: Gastroenterology;  Laterality: N/A;  . EYE SURGERY     RR  . FOOT SURGERY  02/1998   bilateral  . INTRAOPERATIVE ARTERIOGRAM  10/17/2015  . [place]  . TOE SURGERY Left 2015  . TONSILLECTOMY  1964  . TOTAL ABDOMINAL HYSTERECTOMY W/ BILATERAL SALPINGOOPHORECTOMY     Social History   Tobacco Use  . Smoking status: Former Research scientist (life sciences)  . Smokeless tobacco: Never Used  Substance Use Topics  . Alcohol use: Yes    Alcohol/week: 1.0 standard drinks    Types: 1 Standard drinks or equivalent per week    Comment: occasional wine  . Drug use: No   Family History  Problem Relation Age of Onset  . Diabetes Father   . Depression Father   . CAD Father   . Diabetes Mother   . Diverticulosis Mother   . Adrenal disorder Mother   . Breast cancer Other        distant relatives (all on mother's side)  . Cerebral aneurysm Sister    Allergies  Allergen Reactions  . Codeine Nausea And Vomiting    REACTION: nausea and vomiting  . Lisinopril     Cough/throat symptoms   . Latex Rash   Current Outpatient Medications on File Prior to Visit  Medication Sig Dispense Refill  . aspirin EC 81 MG tablet Take 81 mg by mouth daily.    Marland Kitchen buPROPion (WELLBUTRIN XL) 300 MG 24 hr tablet Take 1 tablet (300 mg total) by mouth daily. 90 tablet 3  . calcium carbonate (OSCAL) 1500 (600 Ca) MG TABS tablet Take by mouth 2 (two) times daily with a meal.    . FLUoxetine (PROZAC) 40 MG capsule Take 2 capsules (80 mg total) by mouth daily. 180 capsule 3  . glucose blood (ONETOUCH ULTRA) test strip USE 1 STRIP TO CHECK GLUCOSE TWICE DAILY AS DIRECTED (daignosis code: E11.9) 200 each 1  . hydrochlorothiazide  (HYDRODIURIL) 25 MG tablet Take 1 tablet (25 mg total) by mouth daily. 90 tablet 3  . hyoscyamine (LEVSIN SL) 0.125 MG SL tablet Place 0.125 mg under the tongue every 4 (four) hours as needed.    Marland Kitchen levothyroxine (SYNTHROID, LEVOTHROID) 137 MCG tablet TAKE ONE TABLET BY MOUTH DAILY BEFORE BREAKFAST 90 tablet 3  . metFORMIN (  GLUCOPHAGE) 500 MG tablet Take 1 tablet (500 mg total) by mouth 2 (two) times daily with a meal. 180 tablet 3  . omeprazole (PRILOSEC) 20 MG capsule Take 1 capsule (20 mg total) by mouth daily. 90 capsule 3  . ONETOUCH DELICA LANCETS 79T MISC Check blood sugar twice a day and as directed. Dx E11.9 200 each 1  . potassium chloride (K-DUR,KLOR-CON) 10 MEQ tablet Take 1 tablet (10 mEq total) by mouth daily. 90 tablet 3  . pravastatin (PRAVACHOL) 20 MG tablet Take 1 tablet (20 mg total) by mouth daily. 90 tablet 3  . propranolol ER (INDERAL LA) 80 MG 24 hr capsule Take 1 capsule (80 mg total) by mouth daily. 90 capsule 3   No current facility-administered medications on file prior to visit.      Review of Systems  Constitutional: Negative for activity change, appetite change, fatigue, fever and unexpected weight change.  HENT: Negative for congestion, ear pain, rhinorrhea, sinus pressure and sore throat.   Eyes: Negative for pain, redness and visual disturbance.  Respiratory: Negative for cough, shortness of breath and wheezing.   Cardiovascular: Negative for chest pain and palpitations.  Gastrointestinal: Negative for abdominal pain, blood in stool, constipation and diarrhea.  Endocrine: Negative for polydipsia and polyuria.  Genitourinary: Negative for dysuria, frequency and urgency.  Musculoskeletal: Negative for arthralgias, back pain and myalgias.  Skin: Negative for pallor and rash.  Allergic/Immunologic: Negative for environmental allergies.  Neurological: Negative for dizziness, syncope and headaches.  Hematological: Negative for adenopathy. Does not bruise/bleed  easily.  Psychiatric/Behavioral: Negative for decreased concentration and dysphoric mood. The patient is not nervous/anxious.        Objective:   Physical Exam Constitutional:      General: She is not in acute distress.    Appearance: She is well-developed.  HENT:     Head: Normocephalic and atraumatic.     Nose: Nose normal.     Mouth/Throat:     Mouth: Mucous membranes are moist.     Pharynx: Oropharynx is clear.  Eyes:     General: No scleral icterus.       Right eye: No discharge.        Left eye: No discharge.     Conjunctiva/sclera: Conjunctivae normal.     Pupils: Pupils are equal, round, and reactive to light.  Neck:     Musculoskeletal: Normal range of motion and neck supple.     Thyroid: No thyromegaly.     Vascular: No carotid bruit or JVD.  Cardiovascular:     Rate and Rhythm: Normal rate and regular rhythm.     Heart sounds: Normal heart sounds. No gallop.   Pulmonary:     Effort: Pulmonary effort is normal. No respiratory distress.     Breath sounds: Normal breath sounds. No wheezing or rales.  Abdominal:     General: Bowel sounds are normal. There is no distension.     Palpations: Abdomen is soft. There is no mass.     Tenderness: There is no abdominal tenderness.  Musculoskeletal:        General: No tenderness.     Right lower leg: No edema.     Left lower leg: No edema.  Lymphadenopathy:     Cervical: No cervical adenopathy.  Skin:    General: Skin is warm and dry.     Coloration: Skin is not pale.     Findings: No erythema or rash.  Neurological:     Mental Status:  She is alert. Mental status is at baseline.     Cranial Nerves: No cranial nerve deficit.     Motor: No abnormal muscle tone.     Coordination: Coordination normal.     Gait: Gait normal.     Deep Tendon Reflexes: Reflexes are normal and symmetric. Reflexes normal.  Psychiatric:        Mood and Affect: Mood normal.           Assessment & Plan:   Problem List Items Addressed  This Visit      Cardiovascular and Mediastinum   Essential hypertension    bp in fair control at this time  BP Readings from Last 1 Encounters:  07/16/18 135/88   No changes needed Most recent labs reviewed  Disc lifstyle change with low sodium diet and exercise        CEREBRAL ANEURYSM    Doing well with recent neuro surgery f/u        Endocrine   Diabetes type 2, controlled ([place]) - Primary    Improved Lab Results  Component Value Date   HGBA1C 6.0 07/09/2018   Enc her to keep working on healthy diet and exercise  Continue good eye and foot care  Taking statin  disc imp of low glycemic diet and wt loss to prevent DM2         Other   Hyperlipidemia    LDL is up a bit with pravastatin  Disc goals for lipids and reasons to control them Rev last labs with pt Rev low sat fat diet in detail Enc to watch diet more carefully for fats (trans)       Obesity    Discussed how this problem influences overall health and the risks it imposes  Reviewed plan for weight loss with lower calorie diet (via better food choices and also portion control or program like weight watchers) and exercise building up to or more than 30 minutes 5 days per week including some aerobic activity   Commended on wt loss so far

## 2018-07-16 NOTE — Assessment & Plan Note (Signed)
bp in fair control at this time  BP Readings from Last 1 Encounters:  07/16/18 135/88   No changes needed Most recent labs reviewed  Disc lifstyle change with low sodium diet and exercise

## 2018-07-16 NOTE — Patient Instructions (Signed)
Take care of yourself  Drink lots of fluids in the heat and try to get out and walk in the ams Keep watching your diet and loosing weight  Labs look ok  Blood pressure is acceptable

## 2018-08-22 ENCOUNTER — Other Ambulatory Visit: Payer: Self-pay | Admitting: Family Medicine

## 2018-08-22 DIAGNOSIS — Z1231 Encounter for screening mammogram for malignant neoplasm of breast: Secondary | ICD-10-CM

## 2018-09-26 ENCOUNTER — Ambulatory Visit
Admission: RE | Admit: 2018-09-26 | Discharge: 2018-09-26 | Disposition: A | Discharge status: Home / Self Care | Payer: Medicare Other | Source: Ambulatory Visit | Attending: Family Medicine | Admitting: Family Medicine

## 2018-09-26 DIAGNOSIS — Z1231 Encounter for screening mammogram for malignant neoplasm of breast: Secondary | ICD-10-CM | POA: Insufficient documentation

## 2018-09-28 DIAGNOSIS — Z23 Encounter for immunization: Secondary | ICD-10-CM | POA: Diagnosis not present

## 2018-11-23 ENCOUNTER — Other Ambulatory Visit: Payer: Self-pay | Admitting: Family Medicine

## 2018-12-17 ENCOUNTER — Other Ambulatory Visit: Payer: Self-pay

## 2018-12-17 MED ORDER — ONETOUCH ULTRA VI STRP: ORAL_STRIP | 1 refills | Status: DC

## 2018-12-19 MED ORDER — ONETOUCH ULTRA VI STRP: ORAL_STRIP | 1 refills | Status: DC

## 2018-12-19 NOTE — Addendum Note (Signed)
Addended by: Tammi Sou on: 12/19/2018 11:30 AM   Modules accepted: Orders

## 2018-12-19 NOTE — Telephone Encounter (Signed)
Received fax saying insurance will only cover once daily check of BS since pt isn't insulin dependent resent Rx

## 2019-01-12 ENCOUNTER — Encounter: Payer: Self-pay | Admitting: Emergency Medicine

## 2019-01-12 ENCOUNTER — Emergency Department
Admission: EM | Admit: 2019-01-12 | Discharge: 2019-01-12 | Disposition: A | Discharge status: Home / Self Care | Payer: Medicare Other | Attending: Emergency Medicine | Admitting: Emergency Medicine

## 2019-01-12 DIAGNOSIS — Z87891 Personal history of nicotine dependence: Secondary | ICD-10-CM | POA: Diagnosis not present

## 2019-01-12 DIAGNOSIS — Z79899 Other long term (current) drug therapy: Secondary | ICD-10-CM | POA: Diagnosis not present

## 2019-01-12 DIAGNOSIS — I10 Essential (primary) hypertension: Secondary | ICD-10-CM | POA: Diagnosis not present

## 2019-01-12 DIAGNOSIS — Z7984 Long term (current) use of oral hypoglycemic drugs: Secondary | ICD-10-CM | POA: Diagnosis not present

## 2019-01-12 DIAGNOSIS — E119 Type 2 diabetes mellitus without complications: Secondary | ICD-10-CM | POA: Diagnosis not present

## 2019-01-12 DIAGNOSIS — E039 Hypothyroidism, unspecified: Secondary | ICD-10-CM | POA: Diagnosis not present

## 2019-01-12 DIAGNOSIS — R519 Headache, unspecified: Secondary | ICD-10-CM | POA: Diagnosis not present

## 2019-01-12 DIAGNOSIS — M542 Cervicalgia: Secondary | ICD-10-CM | POA: Diagnosis not present

## 2019-01-12 DIAGNOSIS — R002 Palpitations: Secondary | ICD-10-CM | POA: Diagnosis not present

## 2019-01-12 DIAGNOSIS — Z7982 Long term (current) use of aspirin: Secondary | ICD-10-CM | POA: Insufficient documentation

## 2019-01-12 DIAGNOSIS — H539 Unspecified visual disturbance: Secondary | ICD-10-CM | POA: Diagnosis not present

## 2019-01-12 DIAGNOSIS — H538 Other visual disturbances: Secondary | ICD-10-CM | POA: Diagnosis not present

## 2019-01-12 DIAGNOSIS — Z85828 Personal history of other malignant neoplasm of skin: Secondary | ICD-10-CM | POA: Diagnosis not present

## 2019-01-12 LAB — CBC WITH DIFFERENTIAL/PLATELET
Abs Immature Granulocytes: 0.02 10*3/uL (ref 0.00–0.07)
Basophils Absolute: 0.1 10*3/uL (ref 0.0–0.1)
Basophils Relative: 1 %
Eosinophils Absolute: 0.1 10*3/uL (ref 0.0–0.5)
Eosinophils Relative: 1 %
HCT: 42.5 % (ref 36.0–46.0)
Hemoglobin: 14.7 g/dL (ref 12.0–15.0)
Immature Granulocytes: 0 %
Lymphocytes Relative: 39 %
Lymphs Abs: 2.8 10*3/uL (ref 0.7–4.0)
MCH: 29.9 pg (ref 26.0–34.0)
MCHC: 34.6 g/dL (ref 30.0–36.0)
MCV: 86.4 fL (ref 80.0–100.0)
Monocytes Absolute: 0.6 10*3/uL (ref 0.1–1.0)
Monocytes Relative: 9 %
Neutro Abs: 3.5 10*3/uL (ref 1.7–7.7)
Neutrophils Relative %: 50 %
Platelets: 277 10*3/uL (ref 150–400)
RBC: 4.92 MIL/uL (ref 3.87–5.11)
RDW: 13.1 % (ref 11.5–15.5)
WBC: 7.1 10*3/uL (ref 4.0–10.5)
nRBC: 0 % (ref 0.0–0.2)

## 2019-01-12 LAB — BASIC METABOLIC PANEL
Anion gap: 12 (ref 5–15)
BUN: 28 mg/dL — ABNORMAL HIGH (ref 8–23)
CO2: 23 mmol/L (ref 22–32)
Calcium: 9.4 mg/dL (ref 8.9–10.3)
Chloride: 103 mmol/L (ref 98–111)
Creatinine, Ser: 1 mg/dL (ref 0.44–1.00)
GFR calc Af Amer: >60 mL/min (ref >60)
GFR calc non Af Amer: 57 mL/min — ABNORMAL LOW (ref >60)
Glucose, Bld: 89 mg/dL (ref 70–99)
Potassium: 3.2 mmol/L — ABNORMAL LOW (ref 3.5–5.1)
Sodium: 138 mmol/L (ref 135–145)

## 2019-01-12 LAB — TROPONIN I (HIGH SENSITIVITY): Troponin I (High Sensitivity): 6 ng/L (ref <18)

## 2019-01-12 MED ORDER — LABETALOL HCL 5 MG/ML IV SOLN
10.0000 mg | Freq: Once | INTRAVENOUS | Status: AC
  Administered 2019-01-12: 10 mg via INTRAVENOUS
  Filled 2019-01-12: qty 4

## 2019-01-12 MED ORDER — POTASSIUM CHLORIDE CRYS ER 20 MEQ PO TBCR
40.0000 meq | EXTENDED_RELEASE_TABLET | Freq: Once | ORAL | Status: AC
  Administered 2019-01-12: 40 meq via ORAL
  Filled 2019-01-12: qty 2

## 2019-01-12 NOTE — Discharge Instructions (Addendum)
Please seek medical attention for any high fevers, chest pain, shortness of breath, change in behavior, persistent vomiting, bloody stool or any other new or concerning symptoms.  

## 2019-01-12 NOTE — ED Provider Notes (Signed)
[facility] Emergency Department Provider Note   ____________________________________________   I have reviewed the triage vital signs and the nursing notes.   HISTORY  Chief Complaint Hypertension   History limited by: Not Limited   HPI Andrea Cooper is a 68 y.o. female who presents to the emergency department today with concern for elevated blood pressure, blurry vision, headache and neck pain. The symptoms started roughly 3 days ago. Says she has history of headaches and that this headache is not worse than previous headaches. Denies any trauma to her head or neck. The patient says that she has a history of high blood pressure and that she is on medication for it. Additionally has history of aneurysms for which she is being followed at [facility]. Denies any weakness or numbness in her extremities. Denies fevers.   Records reviewed. Per medical record review patient has a history of cerebral aneurysm.   Past Medical History:  Diagnosis Date  . Anxiety   . Cataract 2019   corrected with surgery  . Depression   . Diabetes mellitus without complication ([place])   . GERD (gastroesophageal reflux disease)   . History of anxiety   . History of cerebral aneurysm repair    history of cerebral aneurysm   . History of depression   . History of hyperglycemia   . History of hyperlipidemia   . History of hypothyroidism   . History of melanoma   . History of motion sickness   . History of skin cancer    hx of melanoma left lower arm, recurrance  . Hypercholesterolemia   . Hypertension   . Hypothyroidism   . IBS (irritable bowel syndrome)   . Nocturnal oxygen desaturation   . PONV (postoperative nausea and vomiting)   . Thyroid disease     Patient Active Problem List   Diagnosis Date Noted  . Allergic dermatitis 01/07/2018  . Diarrhea 11/07/2017  . Dysphagia 01/26/2017  . Hearing loss 02/14/2016  . Estrogen deficiency 02/14/2016  . GERD  (gastroesophageal reflux disease) 08/13/2015  . Fat necrosis (segmental) of breast 08/27/2014  . Visit for screening mammogram 07/25/2014  . Encounter for Medicare annual wellness exam 07/14/2014  . Hypokalemia 12/15/2013  . Other screening mammogram 06/07/2011  . SLEEP APNEA 09/13/2009  . Obesity 01/13/2009  . POSTMENOPAUSAL STATUS 10/01/2008  . BACK PAIN, LUMBAR, WITH RADICULOPATHY 08/10/2008  . SWEATING 07/15/2008  . Diabetes type 2, controlled ([place]) 05/22/2007  . TOBACCO USE, QUIT 05/22/2007  . CEREBRAL ANEURYSM 08/09/2006  . Hypothyroidism 07/25/2006  . Hyperlipidemia 07/25/2006  . ANXIETY 07/25/2006  . Depression with anxiety 07/25/2006  . Essential hypertension 07/25/2006  . IBS 07/25/2006  . OVEREATING 07/25/2006  . SKIN CANCER, HX OF 07/25/2006    Past Surgical History:  Procedure Laterality Date  . ABDOMINAL HYSTERECTOMY    . APPENDECTOMY  1966  . BREAST BIOPSY Left 07-29-14    core bx FIBROCYSTIC CHANGES WITH FEW MICROCALCIFICATIONS.  Marland Kitchen cardiolite  12/1997   normal EF 68%  . CATARACT EXTRACTION W/ INTRAOCULAR LENS  IMPLANT, BILATERAL Bilateral 04/2017   [facility]  . CEREBRAL ANEURYSM REPAIR  2008, 2009   coiling at [facility]  . COLONOSCOPY WITH PROPOFOL N/A 01/22/2018   Procedure: COLONOSCOPY WITH PROPOFOL;  Surgeon: Toledo, Benay Pike, MD;  Location: [facility] ENDOSCOPY;  Service: Gastroenterology;  Laterality: N/A;  . EYE SURGERY     RR  . FOOT SURGERY  02/1998   bilateral  . INTRAOPERATIVE ARTERIOGRAM  10/17/2015  .  [place]  . TOE SURGERY Left 2015  . TONSILLECTOMY  1964  . TOTAL ABDOMINAL HYSTERECTOMY W/ BILATERAL SALPINGOOPHORECTOMY      Prior to Admission medications   Medication Sig Start Date End Date Taking? Authorizing Provider  aspirin EC 81 MG tablet Take 81 mg by mouth daily.    [provider]  buPROPion (WELLBUTRIN XL) 300 MG 24 hr tablet TAKE 1 TABLET DAILY 11/26/18   Tower, [place] A, MD  calcium carbonate (OSCAL) 1500 (600  Ca) MG TABS tablet Take by mouth 2 (two) times daily with a meal.    [provider]  FLUoxetine (PROZAC) 40 MG capsule TAKE 2 CAPSULES DAILY 11/26/18   Tower, [place] A, MD  glucose blood (ONETOUCH ULTRA) test strip USE 1 STRIP TO CHECK GLUCOSE ONCE DAILYH AS DIRECTED (daignosis code: E11.9) 12/19/18   Tower, Wynelle Fanny, MD  hydrochlorothiazide (HYDRODIURIL) 25 MG tablet TAKE 1 TABLET DAILY 11/26/18   Tower, [place] A, MD  hyoscyamine (LEVSIN SL) 0.125 MG SL tablet Place 0.125 mg under the tongue every 4 (four) hours as needed.    [provider]  levothyroxine (SYNTHROID) 137 MCG tablet TAKE 1 TABLET DAILY BEFORE BREAKFAST 11/26/18   Tower, Wynelle Fanny, MD  metFORMIN (GLUCOPHAGE) 500 MG tablet TAKE 1 TABLET TWICE A DAY WITH MEALS 11/26/18   Tower, [place] A, MD  omeprazole (PRILOSEC) 20 MG capsule TAKE 1 CAPSULE DAILY 11/26/18   Tower, Wynelle Fanny, MD  [facility] DELICA LANCETS [id number] MISC Check blood sugar twice a day and as directed. Dx E11.9 05/31/15   Tower, Wynelle Fanny, MD  potassium chloride (KLOR-CON) 10 MEQ tablet TAKE 1 TABLET DAILY 11/26/18   Tower, [place] A, MD  pravastatin (PRAVACHOL) 20 MG tablet TAKE 1 TABLET DAILY 11/26/18   Tower, Wynelle Fanny, MD  propranolol ER (INDERAL LA) 80 MG 24 hr capsule TAKE 1 CAPSULE DAILY 11/26/18   Tower, Wynelle Fanny, MD    Allergies Codeine, Lisinopril, and Latex  Family History  Problem Relation Age of Onset  . Diabetes Father   . Depression Father   . CAD Father   . Diabetes Mother   . Diverticulosis Mother   . Adrenal disorder Mother   . Breast cancer Other        distant relatives (all on mother's side)  . Cerebral aneurysm Sister     Social History Social History   Tobacco Use  . Smoking status: Former Research scientist (life sciences)  . Smokeless tobacco: Never Used  Substance Use Topics  . Alcohol use: Yes    Alcohol/week: 1.0 standard drinks    Types: 1 Standard drinks or equivalent per week    Comment: occasional wine  . Drug use: No    Review of  Systems Constitutional: No fever/chills Eyes: Blurry vision. ENT: No sore throat. Cardiovascular: Denies chest pain. Respiratory: Denies shortness of breath. Gastrointestinal: No abdominal pain.  No nausea, no vomiting.  No diarrhea.   Genitourinary: Negative for dysuria. Musculoskeletal: Positive for neck pain.  Skin: Positive for rash to left arm.  Neurological: Positive for headache.  ____________________________________________   PHYSICAL EXAM:  VITAL SIGNS: ED Triage Vitals [01/12/19 1506]  Enc Vitals Group     BP      Pulse      Resp      Temp      Temp src      SpO2      Weight 200 lb (90.7 kg)     Height 5\' 2"  (1.575 m)  Head Circumference      Peak Flow      Pain Score 0    Constitutional: Alert and oriented.  Eyes: Conjunctivae are normal.  ENT      Head: Normocephalic and atraumatic.      Nose: No congestion/rhinnorhea.      Mouth/Throat: Mucous membranes are moist.      Neck: No stridor. Hematological/Lymphatic/Immunilogical: No cervical lymphadenopathy. Cardiovascular: Normal rate, regular rhythm.  No murmurs, rubs, or gallops.  Respiratory: Normal respiratory effort without tachypnea nor retractions. Breath sounds are clear and equal bilaterally. No wheezes/rales/rhonchi. Gastrointestinal: Soft and non tender. No rebound. No guarding.  Genitourinary: Deferred Musculoskeletal: Normal range of motion in all extremities. No lower extremity edema. Neurologic:  Normal speech and language. No gross focal neurologic deficits are appreciated.  Skin:  Skin is warm, dry and intact. No rash noted. Psychiatric: Mood and affect are normal. Speech and behavior are normal. Patient exhibits appropriate insight and judgment.  ____________________________________________    LABS (pertinent positives/negatives)  Trop hs 6 CBC wbc 7.1, hgb 14.7, plt 277 BMP k 3.2, bun 28, cr 1.00  ____________________________________________   EKG  I, Nance Pear,  attending physician, personally viewed and interpreted this EKG  EKG Time: 1507 Rate: 73 Rhythm: sinus rhythm Axis: left axis deviation Intervals: qtc 521 QRS: incomplete LBBB ST changes: no st elevation Impression: abnormal ekg  ____________________________________________    RADIOLOGY  None ____________________________________________   PROCEDURES  Procedures  ____________________________________________   INITIAL IMPRESSION / ASSESSMENT AND PLAN / ED COURSE  Pertinent labs & imaging results that were available during my care of the patient were reviewed by me and considered in my medical decision making (see chart for details).   Patient presented to the emergency department today because of concern for elevated blood pressure. She does have some blurry vision, headache and neck pain with this. Blood work showed a mild hypokalemia. Patient was given IV antihypertensive here in the emergency department and did have improvement of her symptoms. At this time will plan to discharge. Discussed with patient importance of follow up with PCP in the next day or two for blood pressure management.  ____________________________________________   FINAL CLINICAL IMPRESSION(S) / ED DIAGNOSES  Final diagnoses:  Hypertension, unspecified type     Note: This dictation was prepared with Dragon dictation. Any transcriptional errors that result from this process are unintentional     Nance Pear, MD 01/12/19 1742

## 2019-01-12 NOTE — ED Triage Notes (Signed)
Pt to ED by EMS from [facility]. Pt was sent to ED for evaluation of hypertension. Pt's BP was reported to be 208/140 by [facility] and EMS obtained BP of 187/113. Pt denies chest pain at this time.

## 2019-01-12 NOTE — ED Notes (Addendum)
Pt verbalized understanding of discharge instructions. NAD at this time. Signature pad not working so hard copy of signature page printed and signed by patient and this RN.

## 2019-01-13 ENCOUNTER — Ambulatory Visit (INDEPENDENT_AMBULATORY_CARE_PROVIDER_SITE_OTHER): Payer: Medicare Other | Admitting: Family Medicine

## 2019-01-13 ENCOUNTER — Other Ambulatory Visit: Payer: Self-pay

## 2019-01-13 ENCOUNTER — Encounter: Payer: Self-pay | Admitting: Family Medicine

## 2019-01-13 VITALS — BP 166/94 | HR 84 | Temp 96.7°F | Ht 62.0 in | Wt 202.2 lb

## 2019-01-13 DIAGNOSIS — E876 Hypokalemia: Secondary | ICD-10-CM | POA: Diagnosis not present

## 2019-01-13 DIAGNOSIS — I10 Essential (primary) hypertension: Secondary | ICD-10-CM | POA: Diagnosis not present

## 2019-01-13 DIAGNOSIS — E119 Type 2 diabetes mellitus without complications: Secondary | ICD-10-CM | POA: Diagnosis not present

## 2019-01-13 DIAGNOSIS — Z6837 Body mass index (BMI) 37.0-37.9, adult: Secondary | ICD-10-CM

## 2019-01-13 MED ORDER — LOSARTAN POTASSIUM 50 MG PO TABS: 50.0000 mg | ORAL_TABLET | Freq: Every day | ORAL | 3 refills | Status: DC

## 2019-01-13 NOTE — Assessment & Plan Note (Signed)
Seen recently in UC/ ED for headache/symptoms assoc with inc bp and high readings Reviewed hospital records, lab results and studies in detail  Re assuring bp today BP: (!) 166/94 still elevated but not as high  Pt has some AM headaches (but not like with her cerebral aneurysm)  Hereditary HTN plus worse lifestyle habits lately  inst to continue propranolol and hctz Will add losartan 50 mg today (had cough with lisinopril)  inst to update if any side eff  Rev lifestyle changes needed (wt loss/diet/exercise when tolerated) F/u mid jan as planned/ also to check on DM and lipids

## 2019-01-13 NOTE — Assessment & Plan Note (Signed)
Discussed how this problem influences overall health and the risks it imposes  Reviewed plan for weight loss with lower calorie diet (via better food choices and also portion control or program like weight watchers) and exercise building up to or more than 30 minutes 5 days per week including some aerobic activity    

## 2019-01-13 NOTE — Assessment & Plan Note (Signed)
Mildly low in ED Enc pt to continue her K supplement  Is on hctz -likely cause  Re check in 2 wk for next visit

## 2019-01-13 NOTE — Progress Notes (Signed)
Subjective:    Patient ID: Andrea Cooper, female    DOB: December 09, 1950, 68 y.o.   MRN: [id number]  This visit occurred during the SARS-CoV-2 public health emergency.  Safety protocols were in place, including screening questions prior to the visit, additional usage of staff PPE, and extensive cleaning of exam room while observing appropriate contact time as indicated for disinfecting solutions.    HPI Here for f/u of ED visit for HTN   Wt Readings from Last 3 Encounters:  01/13/19 202 lb 4 oz (91.7 kg)  01/12/19 200 lb (90.7 kg)  07/16/18 202 lb 8 oz (91.9 kg)   36.99 kg/m   Pt presented to UC for headache/neck pain and elevated BP She was sent to ED Given IV labetalol  No acute changes in EKG  She had noted some pm palpitations / waking up with a headache  Did have a headache this am  Better now   Lab Results  Component Value Date   CREATININE 1.00 01/12/2019   BUN 28 (H) 01/12/2019   NA 138 01/12/2019   K 3.2 (L) 01/12/2019   CL 103 01/12/2019   CO2 23 01/12/2019   Lab Results  Component Value Date   WBC 7.1 01/12/2019   HGB 14.7 01/12/2019   HCT 42.5 01/12/2019   MCV 86.4 01/12/2019   PLT 277 01/12/2019   K was supplemented   Note from ED provider Patient presented to the emergency department today because of concern for elevated blood pressure. She does have some blurry vision, headache and neck pain with this. Blood work showed a mild hypokalemia. Patient was given IV antihypertensive here in the emergency department and did have improvement of her symptoms. At this time will plan to discharge. Discussed with patient importance of follow up with PCP in the next day or two for blood pressure management.   BP Readings from Last 3 Encounters:  01/13/19 (!) 166/94  01/12/19 (!) 183/95  07/16/18 135/88   Currently takes  hctz 25 mg  klor con 10 meq Propranolol LA 80 mg   Per pt she has been bad with her diet and exercise over the past 3 mo  With the  pandemic -being bored    In the past cough/throat symptoms from lisinopril   Pulse Readings from Last 3 Encounters:  01/13/19 84  01/12/19 74  07/16/18 73    DM2 Lab Results  Component Value Date   HGBA1C 6.0 07/09/2018   Lab Results  Component Value Date   CHOL 198 07/09/2018   HDL 58.00 07/09/2018   LDLCALC 111 (H) 07/09/2018   LDLDIRECT 160.7 01/14/2007   TRIG 141.0 07/09/2018   CHOLHDL 3 07/09/2018   Lab Results  Component Value Date   TSH 2.94 01/25/2018   Patient Active Problem List   Diagnosis Date Noted  . Allergic dermatitis 01/07/2018  . Diarrhea 11/07/2017  . Dysphagia 01/26/2017  . Hearing loss 02/14/2016  . Estrogen deficiency 02/14/2016  . GERD (gastroesophageal reflux disease) 08/13/2015  . Fat necrosis (segmental) of breast 08/27/2014  . Visit for screening mammogram 07/25/2014  . Encounter for Medicare annual wellness exam 07/14/2014  . Hypokalemia 12/15/2013  . Other screening mammogram 06/07/2011  . SLEEP APNEA 09/13/2009  . Obesity 01/13/2009  . POSTMENOPAUSAL STATUS 10/01/2008  . BACK PAIN, LUMBAR, WITH RADICULOPATHY 08/10/2008  . SWEATING 07/15/2008  . Diabetes type 2, controlled ([place]) 05/22/2007  . TOBACCO USE, QUIT 05/22/2007  . CEREBRAL ANEURYSM 08/09/2006  . Hypothyroidism 07/25/2006  .  Hyperlipidemia associated with type 2 diabetes mellitus ([place]) 07/25/2006  . ANXIETY 07/25/2006  . Depression with anxiety 07/25/2006  . Essential hypertension 07/25/2006  . IBS 07/25/2006  . OVEREATING 07/25/2006  . SKIN CANCER, HX OF 07/25/2006   Past Medical History:  Diagnosis Date  . Anxiety   . Cataract 2019   corrected with surgery  . Depression   . Diabetes mellitus without complication ([place])   . GERD (gastroesophageal reflux disease)   . History of anxiety   . History of cerebral aneurysm repair    history of cerebral aneurysm   . History of depression   . History of hyperglycemia   . History of hyperlipidemia   . History of  hypothyroidism   . History of melanoma   . History of motion sickness   . History of skin cancer    hx of melanoma left lower arm, recurrance  . Hypercholesterolemia   . Hypertension   . Hypothyroidism   . IBS (irritable bowel syndrome)   . Nocturnal oxygen desaturation   . PONV (postoperative nausea and vomiting)   . Thyroid disease    Past Surgical History:  Procedure Laterality Date  . ABDOMINAL HYSTERECTOMY    . APPENDECTOMY  1966  . BREAST BIOPSY Left 07-29-14    core bx FIBROCYSTIC CHANGES WITH FEW MICROCALCIFICATIONS.  Marland Kitchen cardiolite  12/1997   normal EF 68%  . CATARACT EXTRACTION W/ INTRAOCULAR LENS  IMPLANT, BILATERAL Bilateral 04/2017   [facility]  . CEREBRAL ANEURYSM REPAIR  2008, 2009   coiling at [facility]  . COLONOSCOPY WITH PROPOFOL N/A 01/22/2018   Procedure: COLONOSCOPY WITH PROPOFOL;  Surgeon: Toledo, Benay Pike, MD;  Location: [facility] ENDOSCOPY;  Service: Gastroenterology;  Laterality: N/A;  . EYE SURGERY     RR  . FOOT SURGERY  02/1998   bilateral  . INTRAOPERATIVE ARTERIOGRAM  10/17/2015  . [place]  . TOE SURGERY Left 2015  . TONSILLECTOMY  1964  . TOTAL ABDOMINAL HYSTERECTOMY W/ BILATERAL SALPINGOOPHORECTOMY     Social History   Tobacco Use  . Smoking status: Former Research scientist (life sciences)  . Smokeless tobacco: Never Used  Substance Use Topics  . Alcohol use: Yes    Alcohol/week: 1.0 standard drinks    Types: 1 Standard drinks or equivalent per week    Comment: occasional wine  . Drug use: No   Family History  Problem Relation Age of Onset  . Diabetes Father   . Depression Father   . CAD Father   . Diabetes Mother   . Diverticulosis Mother   . Adrenal disorder Mother   . Breast cancer Other        distant relatives (all on mother's side)  . Cerebral aneurysm Sister    Allergies  Allergen Reactions  . Codeine Nausea And Vomiting    REACTION: nausea and vomiting  . Lisinopril     Cough/throat symptoms   . Latex Rash   Current Outpatient  Medications on File Prior to Visit  Medication Sig Dispense Refill  . aspirin EC 81 MG tablet Take 81 mg by mouth daily.    Marland Kitchen buPROPion (WELLBUTRIN XL) 300 MG 24 hr tablet TAKE 1 TABLET DAILY 90 tablet 3  . calcium carbonate (OSCAL) 1500 (600 Ca) MG TABS tablet Take by mouth 2 (two) times daily with a meal.    . FLUoxetine (PROZAC) 40 MG capsule TAKE 2 CAPSULES DAILY 180 capsule 0  . glucose blood (ONETOUCH ULTRA) test strip USE 1 STRIP  TO CHECK GLUCOSE ONCE DAILYH AS DIRECTED (daignosis code: E11.9) 100 each 1  . hydrochlorothiazide (HYDRODIURIL) 25 MG tablet TAKE 1 TABLET DAILY 90 tablet 3  . hyoscyamine (LEVSIN SL) 0.125 MG SL tablet Place 0.125 mg under the tongue every 4 (four) hours as needed.    Marland Kitchen levothyroxine (SYNTHROID) 137 MCG tablet TAKE 1 TABLET DAILY BEFORE BREAKFAST 90 tablet 0  . metFORMIN (GLUCOPHAGE) 500 MG tablet TAKE 1 TABLET TWICE A DAY WITH MEALS 180 tablet 0  . omeprazole (PRILOSEC) 20 MG capsule TAKE 1 CAPSULE DAILY 90 capsule 0  . ONETOUCH DELICA LANCETS [id number] MISC Check blood sugar twice a day and as directed. Dx E11.9 200 each 1  . potassium chloride (KLOR-CON) 10 MEQ tablet TAKE 1 TABLET DAILY 90 tablet 0  . pravastatin (PRAVACHOL) 20 MG tablet TAKE 1 TABLET DAILY 90 tablet 0  . propranolol ER (INDERAL LA) 80 MG 24 hr capsule TAKE 1 CAPSULE DAILY 90 capsule 0   No current facility-administered medications on file prior to visit.     Review of Systems  Constitutional: Negative for activity change, appetite change, fatigue, fever and unexpected weight change.  HENT: Negative for congestion, ear pain, rhinorrhea, sinus pressure and sore throat.   Eyes: Negative for pain, redness and visual disturbance.  Respiratory: Negative for cough, shortness of breath and wheezing.   Cardiovascular: Negative for chest pain and palpitations.  Gastrointestinal: Negative for abdominal pain, blood in stool, constipation and diarrhea.  Endocrine: Negative for polydipsia and polyuria.    Genitourinary: Negative for dysuria, frequency and urgency.  Musculoskeletal: Negative for arthralgias, back pain and myalgias.       Neck pain is better  Skin: Negative for pallor and rash.  Allergic/Immunologic: Negative for environmental allergies.  Neurological: Positive for headaches. Negative for dizziness, syncope, facial asymmetry, speech difficulty, weakness, light-headedness and numbness.  Hematological: Negative for adenopathy. Does not bruise/bleed easily.  Psychiatric/Behavioral: Negative for decreased concentration and dysphoric mood. The patient is not nervous/anxious.        Emotional eating persists       Objective:   Physical Exam Constitutional:      General: She is not in acute distress.    Appearance: Normal appearance. She is well-developed. She is obese. She is not ill-appearing or diaphoretic.  HENT:     Head: Normocephalic and atraumatic.     Mouth/Throat:     Mouth: Mucous membranes are moist.  Eyes:     General: No scleral icterus.    Conjunctiva/sclera: Conjunctivae normal.     Pupils: Pupils are equal, round, and reactive to light.  Neck:     Thyroid: No thyromegaly.     Vascular: No carotid bruit or JVD.  Cardiovascular:     Rate and Rhythm: Normal rate and regular rhythm.     Heart sounds: Normal heart sounds. No gallop.   Pulmonary:     Effort: Pulmonary effort is normal. No respiratory distress.     Breath sounds: Normal breath sounds. No wheezing or rales.  Abdominal:     General: Bowel sounds are normal. There is no distension or abdominal bruit.     Palpations: Abdomen is soft. There is no mass.     Tenderness: There is no abdominal tenderness.  Musculoskeletal:     Cervical back: Normal range of motion and neck supple. No rigidity or tenderness.     Right lower leg: No edema.     Left lower leg: No edema.  Lymphadenopathy:  Cervical: No cervical adenopathy.  Skin:    General: Skin is warm and dry.     Coloration: Skin is not  pale.     Findings: No erythema or rash.  Neurological:     Mental Status: She is alert. Mental status is at baseline.     Cranial Nerves: No cranial nerve deficit.     Sensory: No sensory deficit.     Motor: No weakness.     Coordination: Coordination normal.     Gait: Gait normal.     Deep Tendon Reflexes: Reflexes are normal and symmetric. Reflexes normal.  Psychiatric:        Mood and Affect: Mood normal.        Cognition and Memory: Cognition and memory normal.           Assessment & Plan:   Problem List Items Addressed This Visit      Cardiovascular and Mediastinum   Essential hypertension - Primary    Seen recently in UC/ ED for headache/symptoms assoc with inc bp and high readings Reviewed hospital records, lab results and studies in detail  Re assuring bp today BP: (!) 166/94 still elevated but not as high  Pt has some AM headaches (but not like with her cerebral aneurysm)  Hereditary HTN plus worse lifestyle habits lately  inst to continue propranolol and hctz Will add losartan 50 mg today (had cough with lisinopril)  inst to update if any side eff  Rev lifestyle changes needed (wt loss/diet/exercise when tolerated) F/u mid jan as planned/ also to check on DM and lipids       Relevant Medications   losartan (COZAAR) 50 MG tablet     Endocrine   Diabetes type 2, controlled (H[facility])    Recent inc bp Trial of losartan (had cough with ace)  Also for renal protection F/u 2 wk       Relevant Medications   losartan (COZAAR) 50 MG tablet     Other   Obesity    Discussed how this problem influences overall health and the risks it imposes  Reviewed plan for weight loss with lower calorie diet (via better food choices and also portion control or program like weight watchers) and exercise building up to or more than 30 minutes 5 days per week including some aerobic activity         Hypokalemia    Mildly low in ED Enc pt to continue her K supplement  Is on  hctz -likely cause  Re check in 2 wk for next visit

## 2019-01-13 NOTE — Patient Instructions (Addendum)
Brainstorm a plan for exercise at home or outdoors weather depending    Cook at home  Try to get most of your carbohydrates from produce (with the exception of white potatoes)  Eat less bread/pasta/rice/snack foods/cereals/sweets and other items from the middle of the grocery store (processed carbs)   Start losartan 50 mg once daily  If any side effects please let me know   We will see you next month as planned

## 2019-01-13 NOTE — Assessment & Plan Note (Signed)
Recent inc bp Trial of losartan (had cough with ace)  Also for renal protection F/u 2 wk

## 2019-01-28 ENCOUNTER — Telehealth: Payer: Self-pay

## 2019-01-28 NOTE — Telephone Encounter (Signed)
Does she have any side effects taking one pill daily?   How is her BP doing? (if the checks at home)   When she takes 2 pills daily and feels terrible/ what sort of side effects is she having?  Thanks

## 2019-01-28 NOTE — Telephone Encounter (Signed)
Pt said she didn't have as many side eff when she took only 1 table but even taking the 1 pills was causing her to have tachycardia.  Pt said that her BP is doing fine she has a wrist monitor so she doesn't know how accurate it is but every time she checks it it's in the 120s/70s.   Pt said when she took 2 tablets she not only had that tachycardia she also had nausea, dizziness to the point it was making her unsteady on her feet, and also really fatigue with no energy.

## 2019-01-28 NOTE — Telephone Encounter (Signed)
Pt notified of Dr. Marliss Coots instructions and verbalized understanding. Med removed from med list and added to allergy list. Pt also has a CPE with is next week so we can recheck BP at that time

## 2019-01-28 NOTE — Telephone Encounter (Signed)
Stay off of it entirely if it gives her that much trouble and keep monitoring her bp.   If BP goes up again or she has symptoms from elevated bp let me know  Please remove losartan from med list and add to med intol list

## 2019-01-28 NOTE — Telephone Encounter (Signed)
[place] Night - Client TELEPHONE ADVICE RECORD [facility] Patient Name: Andrea Cooper Gender: Female DOB: 11/27/1950 Age: 69 Y 65 M 65 D Return Phone Number: [phone] (Primary) Address: City/State/Zip: [place] [address] Client [facility] Night - Client Client Site [facility], Roque Lias - MD Contact Type Call Who Is Calling Patient / Member / Family / Caregiver Call Type Triage / Clinical Relationship To Patient Self Return Phone Number [phone] (Primary) Chief Complaint Medication Question (non symptomatic) Reason for Call Symptomatic / Request for [name] states that she started on Losartan 50mg  a few days ago and she has questions about the medication. Translation No Nurse Assessment Nurse: Jimmye Norman, RN, [name] Date/Time (Eastern Time): 01/28/2019 7:51:54 AM Confirm and document reason for call. If symptomatic, describe symptoms. ---Caller states that she started on Losartan 50mg  last Monday and she has questions about the medication. She took it as prescribed until Saturday. States she took one in the morning and one in the afternoon on Saturday. States yesterday she took 2 at the same time and has felt horrible ever since. She was told if it didn't work she would increase the dosage. States she has no energy, sick to her stomach, blurry vision (not currently, states it was yesterday). Has the patient had close contact with a person known or suspected to have the novel coronavirus illness OR traveled / lives in area with major community spread (including international travel) in the last 14 days from the onset of symptoms? * If Asymptomatic, screen for exposure and travel within the last 14 days. ---No Does the patient have any new or worsening symptoms? ---Yes Will a triage be completed? ---Yes Related visit to physician within the  last 2 weeks? ---No Does the PT have any chronic conditions? (i.e. diabetes, asthma, this includes High risk factors for pregnancy, etc.) ---Yes List chronic conditions. ---hypertension Is this a behavioral health or substance abuse call? ---No PLEASE NOTE: All timestamps contained within this report are represented as [place] Standard Time. CONFIDENTIALTY NOTICE: This fax transmission is intended only for the addressee. It contains information that is legally privileged, confidential or otherwise protected from use or disclosure. If you are not the intended recipient, you are strictly prohibited from reviewing, disclosing, copying using or disseminating any of this information or taking any action in reliance on or regarding this information. If you have received this fax in error, please notify us immediately by telephone so that we can arrange for its return to [place]. Phone: [phone], Toll-Free: [phone], Fax: [phone] Page: 2 of 2 Call Id: [id number] Guidelines Guideline Title Affirmed Question Affirmed Notes Nurse Date/Time Eilene Ghazi Time) Nausea Taking prescription medication that could cause nausea (e.g., narcotics/opiates, antibiotics, OCPs, many others) Jimmye Norman, RN, [name] 01/28/2019 7:55:53 AM Disp. Time Eilene Ghazi Time) Disposition Final User 01/28/2019 7:58:40 AM Call PCP within 24 Hours Yes Jimmye Norman, RN, [name] Caller Disagree/Comply Comply Caller Understands Yes PreDisposition InappropriateToAsk Care Advice Given Per Guideline CALL PCP WITHIN 24 HOURS: * IF OFFICE WILL BE OPEN: Call the office when it opens tomorrow morning. CALL BACK IF: * You become worse.

## 2019-01-30 ENCOUNTER — Other Ambulatory Visit: Payer: Self-pay

## 2019-01-30 ENCOUNTER — Telehealth: Payer: Self-pay | Admitting: Family Medicine

## 2019-01-30 ENCOUNTER — Emergency Department
Admission: EM | Admit: 2019-01-30 | Discharge: 2019-01-30 | Disposition: A | Discharge status: Home / Self Care | Payer: Medicare Other | Attending: Emergency Medicine | Admitting: Emergency Medicine

## 2019-01-30 ENCOUNTER — Telehealth: Payer: Self-pay

## 2019-01-30 DIAGNOSIS — I10 Essential (primary) hypertension: Secondary | ICD-10-CM | POA: Diagnosis not present

## 2019-01-30 DIAGNOSIS — R0902 Hypoxemia: Secondary | ICD-10-CM | POA: Diagnosis not present

## 2019-01-30 DIAGNOSIS — Z7982 Long term (current) use of aspirin: Secondary | ICD-10-CM | POA: Insufficient documentation

## 2019-01-30 DIAGNOSIS — R110 Nausea: Secondary | ICD-10-CM | POA: Diagnosis not present

## 2019-01-30 DIAGNOSIS — Z87891 Personal history of nicotine dependence: Secondary | ICD-10-CM | POA: Diagnosis not present

## 2019-01-30 DIAGNOSIS — Z7984 Long term (current) use of oral hypoglycemic drugs: Secondary | ICD-10-CM | POA: Insufficient documentation

## 2019-01-30 DIAGNOSIS — Z79899 Other long term (current) drug therapy: Secondary | ICD-10-CM | POA: Insufficient documentation

## 2019-01-30 DIAGNOSIS — Z91040 Latex allergy status: Secondary | ICD-10-CM | POA: Diagnosis not present

## 2019-01-30 DIAGNOSIS — R42 Dizziness and giddiness: Secondary | ICD-10-CM | POA: Diagnosis not present

## 2019-01-30 DIAGNOSIS — E039 Hypothyroidism, unspecified: Secondary | ICD-10-CM | POA: Diagnosis not present

## 2019-01-30 DIAGNOSIS — E119 Type 2 diabetes mellitus without complications: Secondary | ICD-10-CM

## 2019-01-30 DIAGNOSIS — E1169 Type 2 diabetes mellitus with other specified complication: Secondary | ICD-10-CM

## 2019-01-30 LAB — BASIC METABOLIC PANEL
Anion gap: 11 (ref 5–15)
BUN: 23 mg/dL (ref 8–23)
CO2: 25 mmol/L (ref 22–32)
Calcium: 9.8 mg/dL (ref 8.9–10.3)
Chloride: 104 mmol/L (ref 98–111)
Creatinine, Ser: 0.77 mg/dL (ref 0.44–1.00)
GFR calc Af Amer: >60 mL/min (ref >60)
GFR calc non Af Amer: >60 mL/min (ref >60)
Glucose, Bld: 114 mg/dL — ABNORMAL HIGH (ref 70–99)
Potassium: 3.8 mmol/L (ref 3.5–5.1)
Sodium: 140 mmol/L (ref 135–145)

## 2019-01-30 LAB — CBC
HCT: 41.3 % (ref 36.0–46.0)
Hemoglobin: 13.8 g/dL (ref 12.0–15.0)
MCH: 30.5 pg (ref 26.0–34.0)
MCHC: 33.4 g/dL (ref 30.0–36.0)
MCV: 91.4 fL (ref 80.0–100.0)
Platelets: 269 10*3/uL (ref 150–400)
RBC: 4.52 MIL/uL (ref 3.87–5.11)
RDW: 13.1 % (ref 11.5–15.5)
WBC: 6.7 10*3/uL (ref 4.0–10.5)
nRBC: 0 % (ref 0.0–0.2)

## 2019-01-30 LAB — TSH: TSH: 5.461 u[IU]/mL — ABNORMAL HIGH (ref 0.350–4.500)

## 2019-01-30 MED ORDER — AMLODIPINE BESYLATE 5 MG PO TABS
5.0000 mg | ORAL_TABLET | Freq: Once | ORAL | Status: AC
  Administered 2019-01-30: 11:00:00 5 mg via ORAL
  Filled 2019-01-30: qty 1

## 2019-01-30 MED ORDER — AMLODIPINE BESYLATE 5 MG PO TABS: 5.0000 mg | ORAL_TABLET | Freq: Once | ORAL | Status: DC

## 2019-01-30 NOTE — Telephone Encounter (Signed)
-----   Message from Ellamae Sia sent at 01/22/2019  4:04 PM EST ----- Regarding: Lab orders for Friday, 1.15.21 Patient is scheduled for CPX labs, please order future labs, Thanks , Karna Christmas

## 2019-01-30 NOTE — Discharge Instructions (Addendum)
Follow-up with your regular doctor.  Return emergency department worsening.  Do discuss your thyroid with your regular doctor.  Your labs today are normal except for your thyroid test.  The level was too high which means your medication is not working.  The level is 5.4

## 2019-01-30 NOTE — Telephone Encounter (Signed)
I will watch chart for notes

## 2019-01-30 NOTE — ED Notes (Signed)
First nurse note: pt here via ems from home with c/o htn, dizziness and palpitations. NSR per EMS. 180/106 #18R hand, 95%RA, BG 158.

## 2019-01-30 NOTE — Telephone Encounter (Signed)
[facility] Night - Client TELEPHONE ADVICE RECORD [facility] Patient Name: Andrea Cooper Gender: Female DOB: [birth year] Age: 69 Y 65 M 8 D Return Phone Number: [phone] (Primary), [phone] (Secondary) Address: City/State/Zip: [place] [address] Client [facility] Night - Client Client Site [facility], Roque Lias - MD Contact Type Call Who Is Calling Patient / Member / Family / Caregiver Call Type Triage / Clinical Relationship To Patient Self Return Phone Number [phone] (Primary) Chief Complaint BREATHING - shortness of breath or sounds breathless Reason for Call Symptomatic / Request for [place] has palpitations (heart racing), shortness of breath, BP 190/109. Translation No Nurse Assessment Nurse: Lovena Le, RN, Santiago Glad Date/Time Eilene Ghazi Time): 01/30/2019 6:12:56 AM Confirm and document reason for call. If symptomatic, describe symptoms. ---Caller states that she is having heart palpitations and her bp is 190/109. She feels that her heart is racing. No chest pain but is nauseated. She is on bp meds and has her bp meds this morning. Has the patient had close contact with a person known or suspected to have the novel coronavirus illness OR traveled / lives in area with major community spread (including international travel) in the last 14 days from the onset of symptoms? * If Asymptomatic, screen for exposure and travel within the last 14 days. ---No Does the patient have any new or worsening symptoms? ---Yes Will a triage be completed? ---Yes Related visit to physician within the last 2 weeks? ---Yes Does the PT have any chronic conditions? (i.e. diabetes, asthma, this includes High risk factors for pregnancy, etc.) ---Yes List chronic conditions. 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 ---No Guidelines Guideline Title Affirmed Question Affirmed Notes Nurse Date/Time (Eastern Time) Heart Rate and Heartbeat Questions Dizziness, lightheadedness, or weakness Corrie Dandy, Santiago Glad 01/30/2019 6:16:12 AM Disp. Time Eilene Ghazi Time) Disposition Final User 01/30/2019 6:11:03 AM Send to Urgent Queue [place] NOTE: All timestamps contained within this report are represented as [place] Standard Time. CONFIDENTIALTY NOTICE: This fax transmission is intended only for the addressee. It contains information that is legally privileged, confidential or otherwise protected from use or disclosure. If you are not the intended recipient, you are strictly prohibited from reviewing, disclosing, copying using or disseminating any of this information or taking any action in reliance on or regarding this information. If you have received this fax in error, please notify us immediately by telephone so that we can arrange for its return to [place]. Phone: [phone], Toll-Free: [phone], Fax: [phone] Page: 2 of 2 Call Id: [id number] 01/30/2019 6:18:36 AM Go to ED Now Yes Lovena Le, RN, [place] Disagree/Comply Comply Caller Understands Yes PreDisposition Go to ED Care Advice Given Per Guideline GO TO ED NOW: * Another adult should drive. CARE ADVICE given per Heart Rate and Heartbeat Questions (Adult) guideline. Comments User: Moishe Spice, RN Date/Time Eilene Ghazi Time): 01/30/2019 6:16:36 AM she is having some dyspnea but no distress User: Moishe Spice, RN Date/Time Eilene Ghazi Time): 01/30/2019 6:18:31 AM caller states she will call ems so she will not have to wait Referrals [facility] - ED

## 2019-01-30 NOTE — ED Provider Notes (Signed)
[facility] Emergency Department Provider Note  ____________________________________________   First MD Initiated Contact with Patient 01/30/19 1045     (approximate)  I have reviewed the triage vital signs and the nursing notes.   HISTORY  Chief Complaint Hypertension    HPI Andrea Cooper is a 69 y.o. female presents via EMS from home.  She complained of palpitations and hypertension.  States she was placed on losartan which made her feel really bad and she quit taking the medication.  She has an appointment with her doctor for blood work tomorrow and a physical in a few days.  She also states her hair is falling out.  Patient has a history of hypothyroidism.  She denies any chest pain or shortness of breath.  She states she feels much better now.  "I think I overreacted this morning "    Past Medical History:  Diagnosis Date  . Anxiety   . Cataract 2019   corrected with surgery  . Depression   . Diabetes mellitus without complication ([place])   . GERD (gastroesophageal reflux disease)   . History of anxiety   . History of cerebral aneurysm repair    history of cerebral aneurysm   . History of depression   . History of hyperglycemia   . History of hyperlipidemia   . History of hypothyroidism   . History of melanoma   . History of motion sickness   . History of skin cancer    hx of melanoma left lower arm, recurrance  . Hypercholesterolemia   . Hypertension   . Hypothyroidism   . IBS (irritable bowel syndrome)   . Nocturnal oxygen desaturation   . PONV (postoperative nausea and vomiting)   . Thyroid disease     Patient Active Problem List   Diagnosis Date Noted  . Allergic dermatitis 01/07/2018  . Diarrhea 11/07/2017  . Dysphagia 01/26/2017  . Hearing loss 02/14/2016  . Estrogen deficiency 02/14/2016  . GERD (gastroesophageal reflux disease) 08/13/2015  . Fat necrosis (segmental) of breast 08/27/2014  . Visit for screening mammogram  07/25/2014  . Encounter for Medicare annual wellness exam 07/14/2014  . Hypokalemia 12/15/2013  . Other screening mammogram 06/07/2011  . SLEEP APNEA 09/13/2009  . Obesity 01/13/2009  . POSTMENOPAUSAL STATUS 10/01/2008  . BACK PAIN, LUMBAR, WITH RADICULOPATHY 08/10/2008  . SWEATING 07/15/2008  . Diabetes type 2, controlled ([place]) 05/22/2007  . TOBACCO USE, QUIT 05/22/2007  . CEREBRAL ANEURYSM 08/09/2006  . Hypothyroidism 07/25/2006  . Hyperlipidemia associated with type 2 diabetes mellitus ([place]) 07/25/2006  . ANXIETY 07/25/2006  . Depression with anxiety 07/25/2006  . Essential hypertension 07/25/2006  . IBS 07/25/2006  . OVEREATING 07/25/2006  . SKIN CANCER, HX OF 07/25/2006    Past Surgical History:  Procedure Laterality Date  . ABDOMINAL HYSTERECTOMY    . APPENDECTOMY  1966  . BREAST BIOPSY Left 07-29-14    core bx FIBROCYSTIC CHANGES WITH FEW MICROCALCIFICATIONS.  Marland Kitchen cardiolite  12/1997   normal EF 68%  . CATARACT EXTRACTION W/ INTRAOCULAR LENS  IMPLANT, BILATERAL Bilateral 04/2017   [facility]  . CEREBRAL ANEURYSM REPAIR  2008, 2009   coiling at [facility]  . COLONOSCOPY WITH PROPOFOL N/A 01/22/2018   Procedure: COLONOSCOPY WITH PROPOFOL;  Surgeon: Toledo, Benay Pike, MD;  Location: [facility] ENDOSCOPY;  Service: Gastroenterology;  Laterality: N/A;  . EYE SURGERY     RR  . FOOT SURGERY  02/1998   bilateral  . INTRAOPERATIVE ARTERIOGRAM  10/17/2015  .  [place]  . TOE SURGERY Left 2015  . TONSILLECTOMY  1964  . TOTAL ABDOMINAL HYSTERECTOMY W/ BILATERAL SALPINGOOPHORECTOMY      Prior to Admission medications   Medication Sig Start Date End Date Taking? Authorizing Provider  aspirin EC 81 MG tablet Take 81 mg by mouth daily.    [provider]  buPROPion (WELLBUTRIN XL) 300 MG 24 hr tablet TAKE 1 TABLET DAILY 11/26/18   Tower, [place] A, MD  calcium carbonate (OSCAL) 1500 (600 Ca) MG TABS tablet Take by mouth 2 (two) times daily with a meal.    [provider]  FLUoxetine (PROZAC) 40 MG capsule TAKE 2 CAPSULES DAILY 11/26/18   Tower, [place] A, MD  glucose blood (ONETOUCH ULTRA) test strip USE 1 STRIP TO CHECK GLUCOSE ONCE DAILYH AS DIRECTED (daignosis code: E11.9) 12/19/18   Tower, Wynelle Fanny, MD  hydrochlorothiazide (HYDRODIURIL) 25 MG tablet TAKE 1 TABLET DAILY 11/26/18   Tower, [place] A, MD  hyoscyamine (LEVSIN SL) 0.125 MG SL tablet Place 0.125 mg under the tongue every 4 (four) hours as needed.    [provider]  levothyroxine (SYNTHROID) 137 MCG tablet TAKE 1 TABLET DAILY BEFORE BREAKFAST 11/26/18   Tower, Wynelle Fanny, MD  metFORMIN (GLUCOPHAGE) 500 MG tablet TAKE 1 TABLET TWICE A DAY WITH MEALS 11/26/18   Tower, [place] A, MD  omeprazole (PRILOSEC) 20 MG capsule TAKE 1 CAPSULE DAILY 11/26/18   Tower, Wynelle Fanny, MD  [facility] DELICA LANCETS [id number] MISC Check blood sugar twice a day and as directed. Dx E11.9 05/31/15   Tower, Wynelle Fanny, MD  potassium chloride (KLOR-CON) 10 MEQ tablet TAKE 1 TABLET DAILY 11/26/18   Tower, [place] A, MD  pravastatin (PRAVACHOL) 20 MG tablet TAKE 1 TABLET DAILY 11/26/18   Tower, Wynelle Fanny, MD  propranolol ER (INDERAL LA) 80 MG 24 hr capsule TAKE 1 CAPSULE DAILY 11/26/18   Tower, Wynelle Fanny, MD    Allergies Codeine, Lisinopril, Latex, and Losartan  Family History  Problem Relation Age of Onset  . Diabetes Father   . Depression Father   . CAD Father   . Diabetes Mother   . Diverticulosis Mother   . Adrenal disorder Mother   . Breast cancer Other        distant relatives (all on mother's side)  . Cerebral aneurysm Sister     Social History Social History   Tobacco Use  . Smoking status: Former Research scientist (life sciences)  . Smokeless tobacco: Never Used  Substance Use Topics  . Alcohol use: Yes    Alcohol/week: 1.0 standard drinks    Types: 1 Standard drinks or equivalent per week    Comment: occasional wine  . Drug use: No    Review of Systems  Constitutional: No fever/chills Eyes: No visual changes. ENT: No sore  throat. Respiratory: Denies cough Cardiovascular: Denies chest pain, positive for feelings of palpitations Gastrointestinal: Denies abdominal pain Genitourinary: Negative for dysuria. Musculoskeletal: Negative for back pain. Skin: Negative for rash. Psychiatric: no mood changes,     ____________________________________________   PHYSICAL EXAM:  VITAL SIGNS: ED Triage Vitals  Enc Vitals Group     BP 01/30/19 0729 (!) 164/97     Pulse Rate 01/30/19 0729 78     Resp 01/30/19 0729 16     Temp 01/30/19 0729 98.7 F (37.1 C)     Temp Source 01/30/19 0729 Oral     SpO2 01/30/19 0729 94 %     Weight 01/30/19 0725 200  lb (90.7 kg)     Height 01/30/19 0725 5\' 2"  (1.575 m)     Head Circumference --      Peak Flow --      Pain Score 01/30/19 0725 0     Pain Loc --      Pain Edu? --      Excl. in [place]? --     Constitutional: Alert and oriented. Well appearing and in no acute distress. Eyes: Conjunctivae are normal.  Head: Atraumatic. Nose: No congestion/rhinnorhea. Mouth/Throat: Mucous membranes are moist.   Neck:  supple no lymphadenopathy noted Cardiovascular: Normal rate, regular rhythm. Heart sounds are normal Respiratory: Normal respiratory effort.  No retractions, lungs c t a  GU: deferred Musculoskeletal: FROM all extremities, warm and well perfused Neurologic:  Normal speech and language.  Skin:  Skin is warm, dry and intact. No rash noted. Psychiatric: Mood and affect are normal. Speech and behavior are normal.  ____________________________________________   LABS (all labs ordered are listed, but only abnormal results are displayed)  Labs Reviewed  BASIC METABOLIC PANEL - Abnormal; Notable for the following components:      Result Value   Glucose, Bld 114 (*)    All other components within normal limits  TSH - Abnormal; Notable for the following components:   TSH 5.461 (*)    All other components within normal limits  CBC    ____________________________________________   ____________________________________________  RADIOLOGY    ____________________________________________   PROCEDURES  Procedure(s) performed: EKG shows normal sinus rhythm   Procedures    ____________________________________________   INITIAL IMPRESSION / ASSESSMENT AND PLAN / ED COURSE  Pertinent labs & imaging results that were available during my care of the patient were reviewed by me and considered in my medical decision making (see chart for details).   Patient is a 69 year old female with history of hypothyroidism and hypertension along with other medical problems who presents emergency department with concerns of palpitations.  See HPI  Physical exam shows patient to appear well.  Lungs are clear.  Heart sounds normal.  And EKG is showing normal sinus rhythm.   Basic metabolic panel is normal, CBC is normal, TSH shows an elevated level of 5.46  Explained all findings to the patient.  Explained her she is to call her regular doctor about elevated TSH as her medication is not working.  Had a long discussion with her about how to take the thyroid medication.  She has been taking it with her other medications I explained her she needs to take this pill separately from all other medications and without food.  She states she understands will comply.  Blood pressure has become elevated while here in the ED.  Gave her amlodipine 5 mg p.o.  Blood pressure dropped back down to 164/92 which is an appropriate discharge level.  She is discharged stable condition.  Andrea Cooper was evaluated in Emergency Department on 01/30/2019 for the symptoms described in the history of present illness. She was evaluated in the context of the global COVID-19 pandemic, which necessitated consideration that the patient might be at risk for infection with the SARS-CoV-2 virus that causes COVID-19. Institutional protocols and algorithms that pertain to  the evaluation of patients at risk for COVID-19 are in a state of rapid change based on information released by regulatory bodies including the CDC and federal and state organizations. These policies and algorithms were followed during the patient's care in the ED.   As part of my  medical decision making, I reviewed the following data within the [place] notes reviewed and incorporated, Labs reviewed see above, EKG interpreted NSR, Old chart reviewed, Notes from prior ED visits and [place] Controlled Substance Database  ____________________________________________   FINAL CLINICAL IMPRESSION(S) / ED DIAGNOSES  Final diagnoses:  Essential hypertension  Hypothyroidism, unspecified type      NEW MEDICATIONS STARTED DURING THIS VISIT:  Discharge Medication List as of 01/30/2019 11:30 AM       Note:  This document was prepared using Dragon voice recognition software and may include unintentional dictation errors.    Versie Starks, PA-C 01/30/19 1231    Arta Silence, MD 01/30/19 1420

## 2019-01-30 NOTE — Telephone Encounter (Signed)
Per chart review tab pt went to [facility] ED today.

## 2019-01-30 NOTE — ED Triage Notes (Signed)
Pt here via EMS from home with c/o intermittent palpitations and HTN. Was recently started on losartan, however stated she was having some side effects from that so she stopped it about 3 days ago. Denies cp, appears in nad.

## 2019-01-31 ENCOUNTER — Other Ambulatory Visit: Payer: Self-pay

## 2019-01-31 ENCOUNTER — Other Ambulatory Visit (INDEPENDENT_AMBULATORY_CARE_PROVIDER_SITE_OTHER): Payer: Medicare Other

## 2019-01-31 ENCOUNTER — Ambulatory Visit (INDEPENDENT_AMBULATORY_CARE_PROVIDER_SITE_OTHER): Payer: Medicare Other

## 2019-01-31 ENCOUNTER — Ambulatory Visit: Payer: Medicare Other

## 2019-01-31 DIAGNOSIS — Z0000 Encounter for general adult medical examination without abnormal findings: Secondary | ICD-10-CM | POA: Diagnosis not present

## 2019-01-31 DIAGNOSIS — E1169 Type 2 diabetes mellitus with other specified complication: Secondary | ICD-10-CM | POA: Diagnosis not present

## 2019-01-31 DIAGNOSIS — I10 Essential (primary) hypertension: Secondary | ICD-10-CM | POA: Diagnosis not present

## 2019-01-31 DIAGNOSIS — E119 Type 2 diabetes mellitus without complications: Secondary | ICD-10-CM | POA: Diagnosis not present

## 2019-01-31 DIAGNOSIS — E785 Hyperlipidemia, unspecified: Secondary | ICD-10-CM

## 2019-01-31 LAB — COMPREHENSIVE METABOLIC PANEL
ALT: 22 U/L (ref 0–35)
AST: 15 U/L (ref 0–37)
Albumin: 4.3 g/dL (ref 3.5–5.2)
Alkaline Phosphatase: 45 U/L (ref 39–117)
BUN: 25 mg/dL — ABNORMAL HIGH (ref 6–23)
CO2: 30 mEq/L (ref 19–32)
Calcium: 9.6 mg/dL (ref 8.4–10.5)
Chloride: 101 mEq/L (ref 96–112)
Creatinine, Ser: 0.77 mg/dL (ref 0.40–1.20)
GFR: 74.09 mL/min (ref >60.00)
Glucose, Bld: 135 mg/dL — ABNORMAL HIGH (ref 70–99)
Potassium: 4.3 mEq/L (ref 3.5–5.1)
Sodium: 140 mEq/L (ref 135–145)
Total Bilirubin: 0.4 mg/dL (ref 0.2–1.2)
Total Protein: 7.1 g/dL (ref 6.0–8.3)

## 2019-01-31 LAB — HEMOGLOBIN A1C: Hgb A1c MFr Bld: 5.9 % (ref 4.6–6.5)

## 2019-01-31 LAB — LIPID PANEL
Cholesterol: 177 mg/dL (ref 0–200)
HDL: 57.9 mg/dL (ref >39.00)
LDL Cholesterol: 96 mg/dL (ref 0–99)
NonHDL: 118.87
Total CHOL/HDL Ratio: 3
Triglycerides: 116 mg/dL (ref 0.0–149.0)
VLDL: 23.2 mg/dL (ref 0.0–40.0)

## 2019-01-31 NOTE — Progress Notes (Signed)
Subjective:   Andrea Cooper is a 69 y.o. female who presents for Medicare Annual (Subsequent) preventive examination.  Review of Systems: N/A   This visit is being conducted through telemedicine via telephone at the nurse health advisor's home address due to the COVID-19 pandemic. This patient has given me verbal consent via doximity to conduct this visit, patient states they are participating from their home address. Patient and myself are on the telephone call. There is no referral for this visit. Some vital signs may be absent or patient reported.    Patient identification: identified by name, DOB, and current address   Cardiac Risk Factors include: advanced age (>32men, >39 women);diabetes mellitus;hypertension;dyslipidemia     Objective:     Vitals: There were no vitals taken for this visit.  There is no height or weight on file to calculate BMI.  Advanced Directives 01/31/2019 01/25/2018 01/22/2018 12/14/2017 01/19/2017 01/19/2016 05/24/2015  Does Patient Have a Medical Advance Directive? Yes Yes Yes Yes No Yes No  Type of Paramedic of [place];Living will [place];Living will - Living will - [place];Living will -  Copy of [place] in Chart? No - copy requested No - copy requested - - - No - copy requested -  Would patient like information on creating a medical advance directive? - - - No - Patient declined No - Patient declined - No - patient declined information    Tobacco Social History   Tobacco Use  Smoking Status Former Smoker  Smokeless Tobacco Never Used     Counseling given: Not Answered   Clinical Intake:  Pre-visit preparation completed: Yes  Pain : No/denies pain Pain Score: 0-No pain     Nutritional Risks: Nausea/ vomitting/ diarrhea(some nausea) Diabetes: Yes CBG done?: No Did pt. bring in CBG monitor from home?: No  How often do you need to have someone help you when  you read instructions, pamphlets, or other written materials from your doctor or pharmacy?: 1 - Never What is the last grade level you completed in school?: college  Interpreter Needed?: No  Information entered by :: CJohnson, LPN  Past Medical History:  Diagnosis Date  . Anxiety   . Cataract 2019   corrected with surgery  . Depression   . Diabetes mellitus without complication ([place])   . GERD (gastroesophageal reflux disease)   . History of anxiety   . History of cerebral aneurysm repair    history of cerebral aneurysm   . History of depression   . History of hyperglycemia   . History of hyperlipidemia   . History of hypothyroidism   . History of melanoma   . History of motion sickness   . History of skin cancer    hx of melanoma left lower arm, recurrance  . Hypercholesterolemia   . Hypertension   . Hypothyroidism   . IBS (irritable bowel syndrome)   . Nocturnal oxygen desaturation   . PONV (postoperative nausea and vomiting)   . Thyroid disease    Past Surgical History:  Procedure Laterality Date  . ABDOMINAL HYSTERECTOMY    . APPENDECTOMY  1966  . BREAST BIOPSY Left 07-29-14    core bx FIBROCYSTIC CHANGES WITH FEW MICROCALCIFICATIONS.  Marland Kitchen cardiolite  12/1997   normal EF 68%  . CATARACT EXTRACTION W/ INTRAOCULAR LENS  IMPLANT, BILATERAL Bilateral 04/2017   [facility]  . CEREBRAL ANEURYSM REPAIR  2008, 2009   coiling at [facility]  .  COLONOSCOPY WITH PROPOFOL N/A 01/22/2018   Procedure: COLONOSCOPY WITH PROPOFOL;  Surgeon: Toledo, Benay Pike, MD;  Location: [facility] ENDOSCOPY;  Service: Gastroenterology;  Laterality: N/A;  . EYE SURGERY     RR  . FOOT SURGERY  02/1998   bilateral  . INTRAOPERATIVE ARTERIOGRAM  10/17/2015  . [place]  . TOE SURGERY Left 2015  . TONSILLECTOMY  1964  . TOTAL ABDOMINAL HYSTERECTOMY W/ BILATERAL SALPINGOOPHORECTOMY     Family History  Problem Relation Age of Onset  . Diabetes Father   . Depression Father   . CAD Father   .  Diabetes Mother   . Diverticulosis Mother   . Adrenal disorder Mother   . Breast cancer Other        distant relatives (all on mother's side)  . Cerebral aneurysm Sister    Social History   Socioeconomic History  . Marital status: Married    Spouse name: Not on file  . Number of children: Not on file  . Years of education: Not on file  . Highest education level: Not on file  Occupational History  . Not on file  Tobacco Use  . Smoking status: Former Research scientist (life sciences)  . Smokeless tobacco: Never Used  Substance and Sexual Activity  . Alcohol use: Yes    Alcohol/week: 1.0 standard drinks    Types: 1 Standard drinks or equivalent per week    Comment: occasional wine  . Drug use: No  . Sexual activity: Never  Other Topics Concern  . Not on file  Social History Narrative  . Not on file   Social Determinants of Health   Financial Resource Strain: Low Risk   . Difficulty of Paying Living Expenses: Not hard at all  Food Insecurity: No Food Insecurity  . Worried About Charity fundraiser in the Last Year: Never true  . Ran Out of Food in the Last Year: Never true  Transportation Needs: No Transportation Needs  . Lack of Transportation (Medical): No  . Lack of Transportation (Non-Medical): No  Physical Activity: Inactive  . Days of Exercise per Week: 0 days  . Minutes of Exercise per Session: 0 min  Stress: No Stress Concern Present  . Feeling of Stress : Only a little  Social Connections:   . Frequency of Communication with Friends and Family: Not on file  . Frequency of Social Gatherings with Friends and Family: Not on file  . Attends Religious Services: Not on file  . Active Member of Clubs or Organizations: Not on file  . Attends Archivist Meetings: Not on file  . Marital Status: Not on file    Outpatient Encounter Medications as of 01/31/2019  Medication Sig  . aspirin EC 81 MG tablet Take 81 mg by mouth daily.  Marland Kitchen buPROPion (WELLBUTRIN XL) 300 MG 24 hr tablet TAKE  1 TABLET DAILY  . calcium carbonate (OSCAL) 1500 (600 Ca) MG TABS tablet Take by mouth 2 (two) times daily with a meal.  . FLUoxetine (PROZAC) 40 MG capsule TAKE 2 CAPSULES DAILY  . glucose blood (ONETOUCH ULTRA) test strip USE 1 STRIP TO CHECK GLUCOSE ONCE DAILYH AS DIRECTED (daignosis code: E11.9)  . hydrochlorothiazide (HYDRODIURIL) 25 MG tablet TAKE 1 TABLET DAILY  . hyoscyamine (LEVSIN SL) 0.125 MG SL tablet Place 0.125 mg under the tongue every 4 (four) hours as needed.  Marland Kitchen levothyroxine (SYNTHROID) 137 MCG tablet TAKE 1 TABLET DAILY BEFORE BREAKFAST  . metFORMIN (GLUCOPHAGE) 500 MG tablet TAKE 1  TABLET TWICE A DAY WITH MEALS  . omeprazole (PRILOSEC) 20 MG capsule TAKE 1 CAPSULE DAILY  . ONETOUCH DELICA LANCETS 99991111 MISC Check blood sugar twice a day and as directed. Dx E11.9  . potassium chloride (KLOR-CON) 10 MEQ tablet TAKE 1 TABLET DAILY  . pravastatin (PRAVACHOL) 20 MG tablet TAKE 1 TABLET DAILY  . propranolol ER (INDERAL LA) 80 MG 24 hr capsule TAKE 1 CAPSULE DAILY   No facility-administered encounter medications on file as of 01/31/2019.    Activities of Daily Living In your present state of health, do you have any difficulty performing the following activities: 01/31/2019  Hearing? N  Vision? N  Difficulty concentrating or making decisions? N  Walking or climbing stairs? N  Dressing or bathing? N  Doing errands, shopping? N  Preparing Food and eating ? N  Using the Toilet? N  In the past six months, have you accidently leaked urine? N  Do you have problems with loss of bowel control? Y  Comment some diarrhea at times  Managing your Medications? N  Managing your Finances? N  Housekeeping or managing your Housekeeping? N  Some recent data might be hidden    Patient Care Team: Tower, Wynelle Fanny, MD as PCP - General Byrnett, Forest Gleason, MD (General Surgery) Chucky May, MD as Consulting Physician (Psychiatry) Hoy Finlay, MD as Referring Physician  (Neurosurgery) Christiane Ha as Consulting Physician (Optometry) Rolm Bookbinder, MD as Consulting Physician (Dermatology)    Assessment:   This is a routine wellness examination for Dean.  Exercise Activities and Dietary recommendations Current Exercise Habits: The patient does not participate in regular exercise at present, Exercise limited by: None identified  Goals    . Increase physical activity     Starting 01/26/2018,I will attempt to walk for 20 minutes 3 days per week.     . Patient Stated     01/31/2019, I will work on exercising more daily.        Fall Risk Fall Risk  01/31/2019 01/25/2018 01/19/2017 01/19/2016 07/15/2015  Falls in the past year? 0 1 No Yes (No Data)  Comment - Thanksgiving 2019 fell as a result of wet leaves on sidewalk; injury to face; treatment in ER - - no falls since previous visit.  Number falls in past yr: 0 0 - 2 or more -  Injury with Fall? 0 1 - No -  Risk Factor Category  - - - High Fall Risk -  Comment - - - pt is being referred to PCP for further evaluation -  Risk for fall due to : Medication side effect - - - -  Follow up Falls evaluation completed;Falls prevention discussed - - - -   Is the patient's home free of loose throw rugs in walkways, pet beds, electrical cords, etc?   yes      Grab bars in the bathroom? yes      Handrails on the stairs?   yes      Adequate lighting?   yes  Timed Get Up and Go performed: N/A  Depression Screen PHQ 2/9 Scores 01/31/2019 01/25/2018 01/19/2017 01/19/2016  PHQ - 2 Score 0 2 0 0  PHQ- 9 Score 0 9 0 -     Cognitive Function MMSE - Mini Mental State Exam 01/31/2019 01/25/2018 01/19/2017 01/19/2016  Orientation to time 5 5 5 5   Orientation to Place 5 5 5 5   Registration 3 3 3 3   Attention/ Calculation 5 0 0 0  Recall 3 3 3 3   Language- name 2 objects - 0 0 0  Language- repeat 1 1 1 1   Language- follow 3 step command - 3 3 3   Language- read & follow direction - 0 0 0  Write a sentence - 0 0 0  Copy  design - 0 0 0  Total score - 20 20 20   Mini Cog  Mini-Cog screen was completed. Maximum score is 22. A value of 0 denotes this part of the MMSE was not completed or the patient failed this part of the Mini-Cog screening.       Immunization History  Administered Date(s) Administered  . Fluad Quad(high Dose 65+) 09/28/2018  . Influenza Whole 01/13/2009  . Influenza, High Dose Seasonal PF 11/06/2016, 10/24/2017  . Influenza,inj,Quad PF,6+ Mos 10/07/2013, 12/21/2015  . Pneumococcal Conjugate-13 07/14/2014, 06/20/2017  . Pneumococcal Polysaccharide-23 10/01/2008, 02/14/2016  . Td 01/08/1997, 04/16/2005  . Zoster 12/15/2013    Qualifies for Shingles Vaccine? Yes  Screening Tests Health Maintenance  Topic Date Due  . URINE MICROALBUMIN  01/26/2019  . TETANUS/TDAP  04/15/2025 (Originally 04/17/2015)  . OPHTHALMOLOGY EXAM  07/09/2019  . HEMOGLOBIN A1C  07/31/2019  . MAMMOGRAM  09/26/2019  . FOOT EXAM  01/13/2020  . COLONOSCOPY  01/23/2028  . INFLUENZA VACCINE  Completed  . DEXA SCAN  Completed  . Hepatitis C Screening  Completed  . PNA vac Low Risk Adult  Completed    Cancer Screenings: Lung: Low Dose CT Chest recommended if Age 19-80 years, 30 pack-year currently smoking OR have quit w/in 15 years. Patient does not qualify. Breast:  Up to date on Mammogram? Yes   Up to date of Bone Density/Dexa? Yes Colorectal: completed 01/22/2018  Additional Screenings:  Hepatitis C Screening: 01/19/2016     Plan:   Patient will work on exercising more daily.   I have personally reviewed and noted the following in the patient's chart:   . Medical and social history . Use of alcohol, tobacco or illicit drugs  . Current medications and supplements . Functional ability and status . Nutritional status . Physical activity . Advanced directives . List of other physicians . Hospitalizations, surgeries, and ER visits in previous 12 months . Vitals . Screenings to include cognitive,  depression, and falls . Referrals and appointments  In addition, I have reviewed and discussed with patient certain preventive protocols, quality metrics, and best practice recommendations. A written personalized care plan for preventive services as well as general preventive health recommendations were provided to patient.     Andrez Grime, LPN  [phone]

## 2019-01-31 NOTE — Patient Instructions (Signed)
Andrea Cooper , Thank you for taking time to come for your Medicare Wellness Visit. I appreciate your ongoing commitment to your health goals. Please review the following plan we discussed and let me know if I can assist you in the future.   Screening recommendations/referrals: Colonoscopy: Up to date, completed 01/22/2018 Mammogram: Up to date, completed 09/26/2018 Bone Density: Up to date, completed 03/23/2016 Recommended yearly ophthalmology/optometry visit for glaucoma screening and checkup Recommended yearly dental visit for hygiene and checkup  Vaccinations: Influenza vaccine: Up to date, completed 09/28/2018 Pneumococcal vaccine: Completed series Tdap vaccine: decline Shingles vaccine: discussed    Advanced directives: Please bring a copy of your POA (Power of Attorney) and/or Living Will to your next appointment.   Conditions/risks identified: diabetes, hypertension, hyperlipidemia  Next appointment: 02/04/2019 @ 8:30 am    Preventive Care 65 Years and Older, Female Preventive care refers to lifestyle choices and visits with your health care provider that can promote health and wellness. What does preventive care include?  A yearly physical exam. This is also called an annual well check.  Dental exams once or twice a year.  Routine eye exams. Ask your health care provider how often you should have your eyes checked.  Personal lifestyle choices, including:  Daily care of your teeth and gums.  Regular physical activity.  Eating a healthy diet.  Avoiding tobacco and drug use.  Limiting alcohol use.  Practicing safe sex.  Taking low-dose aspirin every day.  Taking vitamin and mineral supplements as recommended by your health care provider. What happens during an annual well check? The services and screenings done by your health care provider during your annual well check will depend on your age, overall health, lifestyle risk factors, and family history of  disease. Counseling  Your health care provider may ask you questions about your:  Alcohol use.  Tobacco use.  Drug use.  Emotional well-being.  Home and relationship well-being.  Sexual activity.  Eating habits.  History of falls.  Memory and ability to understand (cognition).  Work and work Statistician.  Reproductive health. Screening  You may have the following tests or measurements:  Height, weight, and BMI.  Blood pressure.  Lipid and cholesterol levels. These may be checked every 5 years, or more frequently if you are over 85 years old.  Skin check.  Lung cancer screening. You may have this screening every year starting at age 23 if you have a 30-pack-year history of smoking and currently smoke or have quit within the past 15 years.  Fecal occult blood test (FOBT) of the stool. You may have this test every year starting at age 26.  Flexible sigmoidoscopy or colonoscopy. You may have a sigmoidoscopy every 5 years or a colonoscopy every 10 years starting at age 48.  Hepatitis C blood test.  Hepatitis B blood test.  Sexually transmitted disease (STD) testing.  Diabetes screening. This is done by checking your blood sugar (glucose) after you have not eaten for a while (fasting). You may have this done every 1-3 years.  Bone density scan. This is done to screen for osteoporosis. You may have this done starting at age 48.  Mammogram. This may be done every 1-2 years. Talk to your health care provider about how often you should have regular mammograms. Talk with your health care provider about your test results, treatment options, and if necessary, the need for more tests. Vaccines  Your health care provider may recommend certain vaccines, such as:  Influenza vaccine.  This is recommended every year.  Tetanus, diphtheria, and acellular pertussis (Tdap, Td) vaccine. You may need a Td booster every 10 years.  Zoster vaccine. You may need this after age  40.  Pneumococcal 13-valent conjugate (PCV13) vaccine. One dose is recommended after age 73.  Pneumococcal polysaccharide (PPSV23) vaccine. One dose is recommended after age 31. Talk to your health care provider about which screenings and vaccines you need and how often you need them. This information is not intended to replace advice given to you by your health care provider. Make sure you discuss any questions you have with your health care provider. Document Released: 01/29/2015 Document Revised: 09/22/2015 Document Reviewed: 11/03/2014 Elsevier Interactive Patient Education  2017 [place] Prevention in the Home Falls can cause injuries. They can happen to people of all ages. There are many things you can do to make your home safe and to help prevent falls. What can I do on the outside of my home?  Regularly fix the edges of walkways and driveways and fix any cracks.  Remove anything that might make you trip as you walk through a door, such as a raised step or threshold.  Trim any bushes or trees on the path to your home.  Use bright outdoor lighting.  Clear any walking paths of anything that might make someone trip, such as rocks or tools.  Regularly check to see if handrails are loose or broken. Make sure that both sides of any steps have handrails.  Any raised decks and porches should have guardrails on the edges.  Have any leaves, snow, or ice cleared regularly.  Use sand or salt on walking paths during winter.  Clean up any spills in your garage right away. This includes oil or grease spills. What can I do in the bathroom?  Use night lights.  Install grab bars by the toilet and in the tub and shower. Do not use towel bars as grab bars.  Use non-skid mats or decals in the tub or shower.  If you need to sit down in the shower, use a plastic, non-slip stool.  Keep the floor dry. Clean up any water that spills on the floor as soon as it happens.  Remove  soap buildup in the tub or shower regularly.  Attach bath mats securely with double-sided non-slip rug tape.  Do not have throw rugs and other things on the floor that can make you trip. What can I do in the bedroom?  Use night lights.  Make sure that you have a light by your bed that is easy to reach.  Do not use any sheets or blankets that are too big for your bed. They should not hang down onto the floor.  Have a firm chair that has side arms. You can use this for support while you get dressed.  Do not have throw rugs and other things on the floor that can make you trip. What can I do in the kitchen?  Clean up any spills right away.  Avoid walking on wet floors.  Keep items that you use a lot in easy-to-reach places.  If you need to reach something above you, use a strong step stool that has a grab bar.  Keep electrical cords out of the way.  Do not use floor polish or wax that makes floors slippery. If you must use wax, use non-skid floor wax.  Do not have throw rugs and other things on the floor that can make  you trip. What can I do with my stairs?  Do not leave any items on the stairs.  Make sure that there are handrails on both sides of the stairs and use them. Fix handrails that are broken or loose. Make sure that handrails are as long as the stairways.  Check any carpeting to make sure that it is firmly attached to the stairs. Fix any carpet that is loose or worn.  Avoid having throw rugs at the top or bottom of the stairs. If you do have throw rugs, attach them to the floor with carpet tape.  Make sure that you have a light switch at the top of the stairs and the bottom of the stairs. If you do not have them, ask someone to add them for you. What else can I do to help prevent falls?  Wear shoes that:  Do not have high heels.  Have rubber bottoms.  Are comfortable and fit you well.  Are closed at the toe. Do not wear sandals.  If you use a  stepladder:  Make sure that it is fully opened. Do not climb a closed stepladder.  Make sure that both sides of the stepladder are locked into place.  Ask someone to hold it for you, if possible.  Clearly mark and make sure that you can see:  Any grab bars or handrails.  First and last steps.  Where the edge of each step is.  Use tools that help you move around (mobility aids) if they are needed. These include:  Canes.  Walkers.  Scooters.  Crutches.  Turn on the lights when you go into a dark area. Replace any light bulbs as soon as they burn out.  Set up your furniture so you have a clear path. Avoid moving your furniture around.  If any of your floors are uneven, fix them.  If there are any pets around you, be aware of where they are.  Review your medicines with your doctor. Some medicines can make you feel dizzy. This can increase your chance of falling. Ask your doctor what other things that you can do to help prevent falls. This information is not intended to replace advice given to you by your health care provider. Make sure you discuss any questions you have with your health care provider. Document Released: 10/29/2008 Document Revised: 06/10/2015 Document Reviewed: 02/06/2014 Elsevier Interactive Patient Education  2017 [employer].

## 2019-01-31 NOTE — Progress Notes (Signed)
PCP notes:  Health Maintenance: No gaps noted   Abnormal Screenings: none   Patient concerns: High blood pressure Itchy rash on left wrist TSH level abnormal and experiencing hair loss   Nurse concerns: none   Next PCP appt.: 02/04/2019 @ 8:30 am

## 2019-02-04 ENCOUNTER — Ambulatory Visit (INDEPENDENT_AMBULATORY_CARE_PROVIDER_SITE_OTHER): Payer: Medicare Other | Admitting: Family Medicine

## 2019-02-04 ENCOUNTER — Encounter: Payer: Self-pay | Admitting: Family Medicine

## 2019-02-04 ENCOUNTER — Other Ambulatory Visit: Payer: Self-pay

## 2019-02-04 VITALS — BP 138/90 | HR 73 | Temp 97.2°F | Ht 61.5 in | Wt 202.2 lb

## 2019-02-04 DIAGNOSIS — Z6837 Body mass index (BMI) 37.0-37.9, adult: Secondary | ICD-10-CM

## 2019-02-04 DIAGNOSIS — I10 Essential (primary) hypertension: Secondary | ICD-10-CM

## 2019-02-04 DIAGNOSIS — E1169 Type 2 diabetes mellitus with other specified complication: Secondary | ICD-10-CM | POA: Diagnosis not present

## 2019-02-04 DIAGNOSIS — E119 Type 2 diabetes mellitus without complications: Secondary | ICD-10-CM | POA: Diagnosis not present

## 2019-02-04 DIAGNOSIS — E785 Hyperlipidemia, unspecified: Secondary | ICD-10-CM

## 2019-02-04 DIAGNOSIS — F418 Other specified anxiety disorders: Secondary | ICD-10-CM

## 2019-02-04 DIAGNOSIS — E039 Hypothyroidism, unspecified: Secondary | ICD-10-CM

## 2019-02-04 MED ORDER — PRAVASTATIN SODIUM 20 MG PO TABS: 20.0000 mg | ORAL_TABLET | Freq: Every day | ORAL | 3 refills | Status: DC

## 2019-02-04 MED ORDER — PROPRANOLOL HCL ER 80 MG PO CP24: 80.0000 mg | ORAL_CAPSULE | Freq: Every day | ORAL | 3 refills | Status: DC

## 2019-02-04 MED ORDER — FLUOXETINE HCL 40 MG PO CAPS: 80.0000 mg | ORAL_CAPSULE | Freq: Every day | ORAL | 3 refills | Status: DC

## 2019-02-04 MED ORDER — LEVOTHYROXINE SODIUM 137 MCG PO TABS: ORAL_TABLET | ORAL | 3 refills | Status: DC

## 2019-02-04 MED ORDER — OMEPRAZOLE 20 MG PO CPDR: 20.0000 mg | DELAYED_RELEASE_CAPSULE | Freq: Every day | ORAL | 3 refills | Status: DC

## 2019-02-04 MED ORDER — AMLODIPINE BESYLATE 5 MG PO TABS: 5.0000 mg | ORAL_TABLET | Freq: Every day | ORAL | 3 refills | Status: DC

## 2019-02-04 MED ORDER — METFORMIN HCL 500 MG PO TABS: 500.0000 mg | ORAL_TABLET | Freq: Two times a day (BID) | ORAL | 3 refills | Status: DC

## 2019-02-04 MED ORDER — POTASSIUM CHLORIDE CRYS ER 10 MEQ PO TBCR: 10.0000 meq | EXTENDED_RELEASE_TABLET | Freq: Every day | ORAL | 3 refills | Status: DC

## 2019-02-04 NOTE — Progress Notes (Signed)
Subjective:    Patient ID: Andrea Cooper, female    DOB: April 17, 1950, 69 y.o.   MRN: [id number]  This visit occurred during the SARS-CoV-2 public health emergency.  Safety protocols were in place, including screening questions prior to the visit, additional usage of staff PPE, and extensive cleaning of exam room while observing appropriate contact time as indicated for disinfecting solutions.   HPI Pt presents for annual f/u of chronic medical problems  Wt Readings from Last 3 Encounters:  02/04/19 202 lb 4 oz (91.7 kg)  01/30/19 200 lb (90.7 kg)  01/13/19 202 lb 4 oz (91.7 kg)   37.60 kg/m   Eating has been ok overall  A strange 2 weeks- not hungry due to stress    Had amw on 1/15  No gaps   Mammogram 9/20 Self breast exam -no lumps or changes   Colonoscopy 1/20   dexa 3/18 - BMD in the normal range [place]-none  Fractures - none  Supplements -takes vit D and calcium  Exercise - she got a fitness tracker and got a video for dancing (body groove)    No cp or palpitations or headaches or edema  No side effects to medicines  BP Readings from Last 3 Encounters:  02/04/19 138/90  01/30/19 (!) 164/92  01/13/19 (!) 166/94   at home also 130s/90s    (sometimes in upper 0000000 diastolic)  Last visit added losartan 50 mg (had cough from lisinopril)  She stopped the losartan due to side effects (nausea)  She noted her bp was not improved  She inc her dose to 100 mg on her own - and felt terrible  Went to ER on 1/14 for elevated bp and palpitations  EKG was re assuring    TSH was mildly elevated at 5.46 She was counseled to take thyroid med in am before other meds   Given amlodipine 5 mg in ED and bp did improve   Hypothyroidism Lab Results  Component Value Date   TSH 5.461 (H) 01/30/2019    Is loosing more hair- it is distressing  Now she is taking it that way now    DM2 Lab Results  Component Value Date   HGBA1C 5.9 01/31/2019  very good control with low  dose metformin  Diet is not always low glycemic- plans to work on that   Hyperlipidemia Lab Results  Component Value Date   CHOL 177 01/31/2019   CHOL 198 07/09/2018   CHOL 171 01/25/2018   Lab Results  Component Value Date   HDL 57.90 01/31/2019   HDL 58.00 07/09/2018   HDL 57.10 01/25/2018   Lab Results  Component Value Date   LDLCALC 96 01/31/2019   LDLCALC 111 (H) 07/09/2018   [place] 95 01/25/2018   Lab Results  Component Value Date   TRIG 116.0 01/31/2019   TRIG 141.0 07/09/2018   TRIG 93.0 01/25/2018   Lab Results  Component Value Date   CHOLHDL 3 01/31/2019   CHOLHDL 3 07/09/2018   CHOLHDL 3 01/25/2018   Lab Results  Component Value Date   LDLDIRECT 160.7 01/14/2007   LDLDIRECT 202.7 08/01/2006  pravastatin and diet  She tolerates 20 mg  She avoids fatty foods  More red meat recently    Mood Taking fluoxetine and wellbutrin No changes at all - stable   Patient Active Problem List   Diagnosis Date Noted  . Allergic dermatitis 01/07/2018  . Dysphagia 01/26/2017  . Hearing loss 02/14/2016  . Estrogen deficiency  02/14/2016  . GERD (gastroesophageal reflux disease) 08/13/2015  . Visit for screening mammogram 07/25/2014  . Encounter for Medicare annual wellness exam 07/14/2014  . Hypokalemia 12/15/2013  . Other screening mammogram 06/07/2011  . SLEEP APNEA 09/13/2009  . Class 2 severe obesity due to excess calories with serious comorbidity and body mass index (BMI) of 37.0 to 37.9 in adult ([place]) 01/13/2009  . POSTMENOPAUSAL STATUS 10/01/2008  . BACK PAIN, LUMBAR, WITH RADICULOPATHY 08/10/2008  . SWEATING 07/15/2008  . Diabetes type 2, controlled ([place]) 05/22/2007  . TOBACCO USE, QUIT 05/22/2007  . CEREBRAL ANEURYSM 08/09/2006  . Hypothyroidism 07/25/2006  . Hyperlipidemia associated with type 2 diabetes mellitus ([place]) 07/25/2006  . ANXIETY 07/25/2006  . Depression with anxiety 07/25/2006  . Essential hypertension 07/25/2006  . IBS 07/25/2006    . OVEREATING 07/25/2006  . SKIN CANCER, HX OF 07/25/2006   Past Medical History:  Diagnosis Date  . Anxiety   . Cataract 2019   corrected with surgery  . Depression   . Diabetes mellitus without complication ([place])   . GERD (gastroesophageal reflux disease)   . History of anxiety   . History of cerebral aneurysm repair    history of cerebral aneurysm   . History of depression   . History of hyperglycemia   . History of hyperlipidemia   . History of hypothyroidism   . History of melanoma   . History of motion sickness   . History of skin cancer    hx of melanoma left lower arm, recurrance  . Hypercholesterolemia   . Hypertension   . Hypothyroidism   . IBS (irritable bowel syndrome)   . Nocturnal oxygen desaturation   . PONV (postoperative nausea and vomiting)   . Thyroid disease    Past Surgical History:  Procedure Laterality Date  . ABDOMINAL HYSTERECTOMY    . APPENDECTOMY  1966  . BREAST BIOPSY Left 07-29-14    core bx FIBROCYSTIC CHANGES WITH FEW MICROCALCIFICATIONS.  Marland Kitchen cardiolite  12/1997   normal EF 68%  . CATARACT EXTRACTION W/ INTRAOCULAR LENS  IMPLANT, BILATERAL Bilateral 04/2017   [facility]  . CEREBRAL ANEURYSM REPAIR  2008, 2009   coiling at [facility]  . COLONOSCOPY WITH PROPOFOL N/A 01/22/2018   Procedure: COLONOSCOPY WITH PROPOFOL;  Surgeon: Toledo, Benay Pike, MD;  Location: [facility] ENDOSCOPY;  Service: Gastroenterology;  Laterality: N/A;  . EYE SURGERY     RR  . FOOT SURGERY  02/1998   bilateral  . INTRAOPERATIVE ARTERIOGRAM  10/17/2015  . [place]  . TOE SURGERY Left 2015  . TONSILLECTOMY  1964  . TOTAL ABDOMINAL HYSTERECTOMY W/ BILATERAL SALPINGOOPHORECTOMY     Social History   Tobacco Use  . Smoking status: Former Research scientist (life sciences)  . Smokeless tobacco: Never Used  Substance Use Topics  . Alcohol use: Yes    Alcohol/week: 1.0 standard drinks    Types: 1 Standard drinks or equivalent per week    Comment: occasional wine  . Drug use: No    Family History  Problem Relation Age of Onset  . Diabetes Father   . Depression Father   . CAD Father   . Diabetes Mother   . Diverticulosis Mother   . Adrenal disorder Mother   . Breast cancer Other        distant relatives (all on mother's side)  . Cerebral aneurysm Sister    Allergies  Allergen Reactions  . Codeine Nausea And Vomiting    REACTION: nausea and vomiting  .  Lisinopril     Cough/throat symptoms   . Latex Rash  . Losartan Nausea Only and Other (See Comments)    Tachycardia, dizziness, fatigue   Current Outpatient Medications on File Prior to Visit  Medication Sig Dispense Refill  . aspirin EC 81 MG tablet Take 81 mg by mouth daily.    Marland Kitchen buPROPion (WELLBUTRIN XL) 300 MG 24 hr tablet TAKE 1 TABLET DAILY 90 tablet 3  . calcium carbonate (OSCAL) 1500 (600 Ca) MG TABS tablet Take by mouth 2 (two) times daily with a meal.    . glucose blood (ONETOUCH ULTRA) test strip USE 1 STRIP TO CHECK GLUCOSE ONCE DAILYH AS DIRECTED (daignosis code: E11.9) 100 each 1  . hydrochlorothiazide (HYDRODIURIL) 25 MG tablet TAKE 1 TABLET DAILY 90 tablet 3  . hyoscyamine (LEVSIN SL) 0.125 MG SL tablet Place 0.125 mg under the tongue every 4 (four) hours as needed.    [name] DELICA LANCETS [id number] MISC Check blood sugar twice a day and as directed. Dx E11.9 200 each 1   No current facility-administered medications on file prior to visit.     Review of Systems  Constitutional: Positive for fatigue. Negative for activity change, appetite change, fever and unexpected weight change.  HENT: Negative for congestion, ear pain, rhinorrhea, sinus pressure and sore throat.   Eyes: Negative for pain, redness and visual disturbance.  Respiratory: Negative for cough, shortness of breath and wheezing.   Cardiovascular: Negative for chest pain and palpitations.  Gastrointestinal: Negative for abdominal pain, blood in stool, constipation and diarrhea.  Endocrine: Negative for polydipsia and polyuria.   Genitourinary: Negative for dysuria, frequency and urgency.  Musculoskeletal: Negative for arthralgias, back pain and myalgias.  Skin: Negative for pallor and rash.  Allergic/Immunologic: Negative for environmental allergies.  Neurological: Negative for dizziness, syncope and headaches.  Hematological: Negative for adenopathy. Does not bruise/bleed easily.  Psychiatric/Behavioral: Negative for decreased concentration and dysphoric mood. The patient is not nervous/anxious.        Objective:   Physical Exam Constitutional:      General: She is not in acute distress.    Appearance: Normal appearance. She is well-developed. She is obese. She is not ill-appearing or diaphoretic.  HENT:     Head: Normocephalic and atraumatic.     Right Ear: Tympanic membrane, ear canal and external ear normal.     Left Ear: Tympanic membrane, ear canal and external ear normal.     Nose: Nose normal. No congestion.     Mouth/Throat:     Mouth: Mucous membranes are moist.     Pharynx: Oropharynx is clear. No posterior oropharyngeal erythema.  Eyes:     General: No scleral icterus.    Extraocular Movements: Extraocular movements intact.     Conjunctiva/sclera: Conjunctivae normal.     Pupils: Pupils are equal, round, and reactive to light.  Neck:     Thyroid: No thyromegaly.     Vascular: No carotid bruit or JVD.  Cardiovascular:     Rate and Rhythm: Normal rate and regular rhythm.     Pulses: Normal pulses.     Heart sounds: Normal heart sounds. No gallop.   Pulmonary:     Effort: Pulmonary effort is normal. No respiratory distress.     Breath sounds: Normal breath sounds. No wheezing.     Comments: Good air exch Chest:     Chest wall: No tenderness.  Abdominal:     General: Bowel sounds are normal. There is no distension or  abdominal bruit.     Palpations: Abdomen is soft. There is no mass.     Tenderness: There is no abdominal tenderness.     Hernia: No hernia is present.  Genitourinary:     Comments: Breast exam: No mass, nodules, thickening, tenderness, bulging, retraction, inflamation, nipple discharge or skin changes noted.  No axillary or clavicular LA.     Musculoskeletal:        General: No tenderness. Normal range of motion.     Cervical back: Normal range of motion and neck supple. No rigidity. No muscular tenderness.     Right lower leg: No edema.     Left lower leg: No edema.  Lymphadenopathy:     Cervical: No cervical adenopathy.  Skin:    General: Skin is warm and dry.     Coloration: Skin is not pale.     Findings: No erythema or rash.     Comments: Solar lentigines diffusely Some small sks   Neurological:     Mental Status: She is alert. Mental status is at baseline.     Cranial Nerves: No cranial nerve deficit.     Motor: No abnormal muscle tone.     Coordination: Coordination normal.     Gait: Gait normal.     Deep Tendon Reflexes: Reflexes are normal and symmetric. Reflexes normal.  Psychiatric:        Attention and Perception: Attention normal.        Mood and Affect: Mood normal.        Cognition and Memory: Cognition and memory normal.     Comments: Pleasant            Assessment & Plan:   Problem List Items Addressed This Visit      Cardiovascular and Mediastinum   Essential hypertension - Primary    bp in fair control at this time  BP Readings from Last 1 Encounters:  02/04/19 138/90   Intol of losartan at higher doses  Will add amlodipine 5 mg daily  Pt will check bp at home and update  Most recent labs reviewed  Disc lifstyle change with low sodium diet and exercise        Relevant Medications   amLODipine (NORVASC) 5 MG tablet   pravastatin (PRAVACHOL) 20 MG tablet   propranolol ER (INDERAL LA) 80 MG 24 hr capsule     Endocrine   Hypothyroidism    Hypothyroidism  Pt has no clinical changes No change in energy level but does note hair loss  and no tremor Lab Results  Component Value Date   TSH 5.461 (H) 01/30/2019     Pt was taking medication with other meds and food Has changed that to am before food or other meds  Plan to re check this in a month She will also address hair loss with her dermatologist       Relevant Medications   levothyroxine (SYNTHROID) 137 MCG tablet   propranolol ER (INDERAL LA) 80 MG 24 hr capsule   Hyperlipidemia associated with type 2 diabetes mellitus ([place])    Disc goals for lipids and reasons to control them Rev last labs with pt Rev low sat fat diet in detail Fairly controlled with pravastatin and diet        Relevant Medications   metFORMIN (GLUCOPHAGE) 500 MG tablet   pravastatin (PRAVACHOL) 20 MG tablet   Diabetes type 2, controlled ([place])    Lab Results  Component Value Date   HGBA1C 5.9  01/31/2019   Well controlled with metformin and dit  Taking statin  Intolerant of ace or arb      Relevant Medications   metFORMIN (GLUCOPHAGE) 500 MG tablet   pravastatin (PRAVACHOL) 20 MG tablet     Other   Class 2 severe obesity due to excess calories with serious comorbidity and body mass index (BMI) of 37.0 to 37.9 in adult [facility])    Discussed how this problem influences overall health and the risks it imposes  Reviewed plan for weight loss with lower calorie diet (via better food choices and also portion control or program like weight watchers) and exercise building up to or more than 30 minutes 5 days per week including some aerobic activity         Relevant Medications   metFORMIN (GLUCOPHAGE) 500 MG tablet   Depression with anxiety    Stable as long as pt continues fluoxetine and wellbutrin  Reviewed stressors/ coping techniques/symptoms/ support sources/ tx options and side effects in detail today Enc good self care and exercise       Relevant Medications   FLUoxetine (PROZAC) 40 MG capsule

## 2019-02-04 NOTE — Assessment & Plan Note (Signed)
Stable as long as pt continues fluoxetine and wellbutrin  Reviewed stressors/ coping techniques/symptoms/ support sources/ tx options and side effects in detail today Enc good self care and exercise

## 2019-02-04 NOTE — Patient Instructions (Addendum)
Talk to your dermatology re: nails Andrea Cooper and dry skin   Try to get most of your carbohydrates from produce (with the exception of white potatoes)  Eat less bread/pasta/rice/snack foods/cereals/sweets and other items from the middle of the grocery store (processed carbs) Drink water   For cholesterol  Avoid red meat/ fried foods/ egg yolks/ fatty breakfast meats/ butter, cheese and high fat dairy/ and shellfish   For blood pressure stay off losartan  Take amlodipine 5 mg once daily   Watch your BP  Update [place] if not improved in a month  Also if side effects- stop medicine and let us know   Get back to exercise

## 2019-02-04 NOTE — Assessment & Plan Note (Signed)
Discussed how this problem influences overall health and the risks it imposes  Reviewed plan for weight loss with lower calorie diet (via better food choices and also portion control or program like weight watchers) and exercise building up to or more than 30 minutes 5 days per week including some aerobic activity    

## 2019-02-04 NOTE — Assessment & Plan Note (Signed)
Lab Results  Component Value Date   HGBA1C 5.9 01/31/2019   Well controlled with metformin and dit  Taking statin  Intolerant of ace or arb

## 2019-02-04 NOTE — Assessment & Plan Note (Signed)
Disc goals for lipids and reasons to control them Rev last labs with pt Rev low sat fat diet in detail Fairly controlled with pravastatin and diet

## 2019-02-04 NOTE — Assessment & Plan Note (Signed)
bp in fair control at this time  BP Readings from Last 1 Encounters:  02/04/19 138/90   Intol of losartan at higher doses  Will add amlodipine 5 mg daily  Pt will check bp at home and update  Most recent labs reviewed  Disc lifstyle change with low sodium diet and exercise

## 2019-02-04 NOTE — Assessment & Plan Note (Addendum)
Hypothyroidism  Pt has no clinical changes No change in energy level but does note hair loss  and no tremor Lab Results  Component Value Date   TSH 5.461 (H) 01/30/2019    Pt was taking medication with other meds and food Has changed that to am before food or other meds  Plan to re check this in a month She will also address hair loss with her dermatologist

## 2019-02-13 ENCOUNTER — Telehealth: Payer: Self-pay

## 2019-02-13 DIAGNOSIS — I10 Essential (primary) hypertension: Secondary | ICD-10-CM | POA: Diagnosis not present

## 2019-02-13 NOTE — Telephone Encounter (Signed)
Pt notified of Dr. Marliss Coots instructions and verbalized understanding. She usually takes her BP med in the morning so she will take extra pill now and then 2 tabs in the morning and then update [place] tomorrow on how BP is running

## 2019-02-13 NOTE — Telephone Encounter (Signed)
I will watch epic for correspondence from ER

## 2019-02-13 NOTE — Telephone Encounter (Signed)
Pt said BP not coming down; pt taking med as instructed since 02/10/19. Pt has new BP cuff; 02/11/19 8:00 AM BP 208/119 P 73 no reason for elevated BP 02/12/19 7:00 PM BP 187/109 P 82 02/13/19 8:11 AM BP 234/121 P 73 02/13/19 8:15 AM BP 252/155 P ? 02/13/19 10:05 AM  BP 232/152 P76  Pt is having constant H/A;now pain level is 3-4. No dizziness and CP. Pt feels like heart is racing when lays down. Pt has SOB upon exertion. No other covid symptoms,no travel and no known exposure to covid.  pts friend will drive pt to [facility] ED now. Pt will take BP cuff with her to see if accurate. Pt refused 911 call.

## 2019-02-13 NOTE — Telephone Encounter (Signed)
That is indeed improved and reassuring.   (her bp at the clinic on her machine was 183/111) so I think her cuff is running high   Right now she is taking amlodipine 5 mg daily  Please increase it to 10 mg and let me know if any problems or side eff   (she can just double up on the 5 mg pills)  If she already took it today- just take another one  Update [place] tomorrow re: how she is feeling   Of course if cough/sob or fever - also needs covid testing

## 2019-02-13 NOTE — Telephone Encounter (Signed)
Pt called and left a message on the machine requesting a call back for recommendations.   Pt was advised to go to the ED because of her elevated BP and sx's - patient went to [facility] instead of the ED and her BP was 174/94. Pt was advised to contact her PCP for further instruction.   Please advise, thanks.

## 2019-02-14 NOTE — Telephone Encounter (Signed)
Pt said she was fine taking one tablet of the losartan daily she said what happened is she usually takes her losartan in the morning and later that afternoon she took a 2nd dose of the losartan due to her BP being so high and it helped and she didn't have any side effs. Pt said since she was fine that day when she woke up the next day she took 2 tablets of the losartan at the same time and that's when she had the reaction. Pt has #90 tabs of the 50mg  of losartan so she will restart taking that on top of the amlodipine.  Regarding the O2 at night pt said that Dr. Glori Bickers had referred her for a sleep study many years ago, she's not exactly sure what the diagnosis was but she thinks they told her she stopped sleeping in her and recommended O2 at night (not a C-Pap). Pt not sure what happened but a few years later the O2 at night was d/c

## 2019-02-14 NOTE — Telephone Encounter (Signed)
Pt notified of Dr. Marliss Coots comments and instructions and verbalized understanding. She will update [place] Monday

## 2019-02-14 NOTE — Telephone Encounter (Signed)
From out last talk - I think we noted she can tolerate low dose losartan (ARB) but not high dose (is that correct?) If so would she be open to trying the 50 mg again (in combo with the increased amlodipine) ?  What caused her need for 02 in the past?  I don't think her sats have been low

## 2019-02-14 NOTE — Telephone Encounter (Signed)
Called to check on pt's BP today. Pt took and extra dose yesterday and 2 pills this morning. Pt is aware from the [facility] that her BP cuff is just a little higher the what her BP is but her BP is still running high even with the extra doses of amlodipine. Pt said last check it was 150/109 pt said she has checked it 3x today and it's always been 150-180s/100-110. Pt said she feels okay but she still is dealing with tachycardia and a HA. Yesterday she mentioned the SOB but she did want me to ask Dr. Glori Bickers a question. Pt said that for years she was on oxygen at night but when she went on Medicare they told her she didn't qualify for it anymore and couldn't get it so she hasn't had O2 at night for a while. Pt wanted me to see if Dr. Glori Bickers thinks that is playing a role in her BP issues that's she's not getting enough oxygen at night.

## 2019-02-14 NOTE — Telephone Encounter (Signed)
Thanks for letting me know  I doubt low 02 is playing a role but it may be something to consider later  Please take the losartan over the weekend and update [place] early next week re: how you are doing

## 2019-02-17 ENCOUNTER — Telehealth: Payer: Self-pay | Admitting: *Deleted

## 2019-02-17 DIAGNOSIS — I10 Essential (primary) hypertension: Secondary | ICD-10-CM

## 2019-02-17 MED ORDER — PROPRANOLOL HCL ER 120 MG PO CP24: 120.0000 mg | ORAL_CAPSULE | Freq: Every day | ORAL | 3 refills | Status: DC

## 2019-02-17 MED ORDER — AMLODIPINE BESYLATE 10 MG PO TABS: 10.0000 mg | ORAL_TABLET | Freq: Every day | ORAL | 3 refills | Status: DC

## 2019-02-17 NOTE — Telephone Encounter (Signed)
I want to increase her propranolol LA from 80 to 120 mg  I pended px to send if agreeable   I also pended amlodipine at the 10 mg dose to sent so she has enough   Watch bp and call us in several days with an update  Also watch pulse-if lower than 60 bpm or if she feels dizzy or faint-hold the new propranolol and go back to prev dose and call us  Her pulse has been fine and I do not expect this  Thanks

## 2019-02-17 NOTE — Telephone Encounter (Signed)
Pt said she is already taking 10 mgs of amlodipine, per last phone note from Friday pt has been taking 10 mg of amlodipine and 50 mg of the losartan, pt said since she is already doing this and BP is still high what should she do next

## 2019-02-17 NOTE — Telephone Encounter (Signed)
Thanks Please increase amlodipine to 10 mg once daily (can take 2 of what she has) and continue other medicines as ordered  Update me mid week re: readings  Earlier if bp increases or symptoms worsen  Thanks

## 2019-02-17 NOTE — Telephone Encounter (Signed)
Pt notified of Dr. Marliss Coots instructions and verbalized understanding. Rx sent to pharmacy and pt will update [place]

## 2019-02-17 NOTE — Telephone Encounter (Signed)
Patient called stating that her blood pressure medication has recently been changed. Patient stated that she was told to call back today with an update on her readings. Patient stated Saturday 8:30 am 197/118, 5:30 pm 160/88. Sunday 5:30 am 159/97, 9:30 am 172/100,.  Today  at 5:00 am 154/104, 6:00 am 152/97. Patient stated that she does have a slight headache level 2 on the pain scale of 1 to 10.

## 2019-02-20 NOTE — Telephone Encounter (Signed)
This am her BP was 190/117 and woke up with a headache. Since she takes 3 meds for BP, she is asking should she split the once a day to 1 of them in the morning and 1 in the evening along with her losartan. Please advise.

## 2019-02-20 NOTE — Telephone Encounter (Signed)
Pt agrees with Cardiology referral. Pt went over meds to make sure she's taking them right, 10 mg of amlodipine, 120 mgs of propranolol and 50 mg of losartan. Pt said she is taking all of those meds so I did advise her that our [facility] will call to schedule appt. Pt just asked how soon because her headache isn't getting any better due to her hight BP. Also pt lives in [place] so would like to see a cardiologist in [place]

## 2019-02-20 NOTE — Telephone Encounter (Signed)
That is fine  I want to refer her to cardiology for help with management of high blood pressure - we don't seem to be making any progress  Is she agreeable?

## 2019-02-20 NOTE — Telephone Encounter (Signed)
Referral done Will route to [facility]  

## 2019-02-20 NOTE — Addendum Note (Signed)
Addended by: Loura Pardon A on: 02/20/2019 09:56 AM   Modules accepted: Orders

## 2019-02-21 ENCOUNTER — Ambulatory Visit (INDEPENDENT_AMBULATORY_CARE_PROVIDER_SITE_OTHER): Payer: Medicare Other

## 2019-02-21 ENCOUNTER — Ambulatory Visit (INDEPENDENT_AMBULATORY_CARE_PROVIDER_SITE_OTHER): Payer: Medicare Other | Admitting: Cardiology

## 2019-02-21 ENCOUNTER — Other Ambulatory Visit: Payer: Self-pay

## 2019-02-21 ENCOUNTER — Encounter: Payer: Self-pay | Admitting: Cardiology

## 2019-02-21 VITALS — BP 142/84 | HR 75 | Ht 61.5 in | Wt 201.5 lb

## 2019-02-21 DIAGNOSIS — R002 Palpitations: Secondary | ICD-10-CM

## 2019-02-21 DIAGNOSIS — I10 Essential (primary) hypertension: Secondary | ICD-10-CM | POA: Diagnosis not present

## 2019-02-21 DIAGNOSIS — E7800 Pure hypercholesterolemia, unspecified: Secondary | ICD-10-CM

## 2019-02-21 NOTE — Progress Notes (Signed)
Cardiology Office Note:    Date:  02/21/2019   ID:  Andrea Cooper, DOB 1950-03-12, MRN [id number]  PCP:  Andrea Greenspan, MD  Cardiologist:  No primary care provider on file.  Electrophysiologist:  None   Referring MD: Andrea Greenspan, MD   Chief Complaint  Patient presents with  . office visit    Pt having hypertension. heart fluttering "beating really fast". Meds verbally reviewed w/ pt.    History of Present Illness:    Andrea Cooper is a 69 y.o. female with a hx of anxiety, diabetes, hypertension being seen for heart fluttering and hypertension.  Patient states having heart fluttering/palpitations for about 2 months now.  Symptoms usually occur when she is about to go to sleep.  Symptoms typically last about 10 to 15 minutes and occur daily.  She denies any dizziness, presyncope or syncope.  Denies chest pain, shortness of breath, edema.  She also notes difficult to control blood pressures over the past couple of months.  Her blood pressure meds were adjusted roughly 2 to 3 days ago with increased doses of amlodipine to 10 mg, losartan was increased to 50 mg daily, propanolol was increased to 120.  Past Medical History:  Diagnosis Date  . Anxiety   . Cataract 2019   corrected with surgery  . Depression   . Diabetes mellitus without complication ([place])   . GERD (gastroesophageal reflux disease)   . History of anxiety   . History of cerebral aneurysm repair    history of cerebral aneurysm   . History of depression   . History of hyperglycemia   . History of hyperlipidemia   . History of hypothyroidism   . History of melanoma   . History of motion sickness   . History of skin cancer    hx of melanoma left lower arm, recurrance  . Hypercholesterolemia   . Hypertension   . Hypothyroidism   . IBS (irritable bowel syndrome)   . Nocturnal oxygen desaturation   . PONV (postoperative nausea and vomiting)   . Thyroid disease     Past Surgical History:  Procedure Laterality  Date  . ABDOMINAL HYSTERECTOMY    . APPENDECTOMY  1966  . BREAST BIOPSY Left 07-29-14    core bx FIBROCYSTIC CHANGES WITH FEW MICROCALCIFICATIONS.  Marland Kitchen cardiolite  12/1997   normal EF 68%  . CATARACT EXTRACTION W/ INTRAOCULAR LENS  IMPLANT, BILATERAL Bilateral 04/2017   [facility]  . CEREBRAL ANEURYSM REPAIR  2008, 2009   coiling at [facility]  . COLONOSCOPY WITH PROPOFOL N/A 01/22/2018   Procedure: COLONOSCOPY WITH PROPOFOL;  Surgeon: Toledo, Benay Pike, MD;  Location: [facility] ENDOSCOPY;  Service: Gastroenterology;  Laterality: N/A;  . EYE SURGERY     RR  . FOOT SURGERY  02/1998   bilateral  . INTRAOPERATIVE ARTERIOGRAM  10/17/2015  . [place]  . TOE SURGERY Left 2015  . TONSILLECTOMY  1964  . TOTAL ABDOMINAL HYSTERECTOMY W/ BILATERAL SALPINGOOPHORECTOMY      Current Medications: Current Meds  Medication Sig  . amLODipine (NORVASC) 10 MG tablet Take 1 tablet (10 mg total) by mouth daily.  Marland Kitchen aspirin EC 81 MG tablet Take 81 mg by mouth daily.  Marland Kitchen buPROPion (WELLBUTRIN XL) 300 MG 24 hr tablet TAKE 1 TABLET DAILY  . calcium carbonate (OSCAL) 1500 (600 Ca) MG TABS tablet Take by mouth 2 (two) times daily with a meal.  . FLUoxetine (PROZAC) 40 MG capsule Take 2 capsules (  80 mg total) by mouth daily.  Marland Kitchen glucose blood (ONETOUCH ULTRA) test strip USE 1 STRIP TO CHECK GLUCOSE ONCE DAILYH AS DIRECTED (daignosis code: E11.9)  . hydrochlorothiazide (HYDRODIURIL) 25 MG tablet TAKE 1 TABLET DAILY  . hyoscyamine (LEVSIN SL) 0.125 MG SL tablet Place 0.125 mg under the tongue every 4 (four) hours as needed.  Marland Kitchen levothyroxine (SYNTHROID) 137 MCG tablet TAKE 1 TABLET DAILY BEFORE BREAKFAST  . losartan (COZAAR) 25 MG tablet Take 25 mg by mouth 2 (two) times daily.  . metFORMIN (GLUCOPHAGE) 500 MG tablet Take 1 tablet (500 mg total) by mouth 2 (two) times daily with a meal.  . omeprazole (PRILOSEC) 20 MG capsule Take 1 capsule (20 mg total) by mouth daily.  [name] DELICA LANCETS 99991111 MISC Check  blood sugar twice a day and as directed. Dx E11.9  . potassium chloride (KLOR-CON) 10 MEQ tablet Take 1 tablet (10 mEq total) by mouth daily.  . pravastatin (PRAVACHOL) 20 MG tablet Take 1 tablet (20 mg total) by mouth daily.  . propranolol ER (INDERAL LA) 120 MG 24 hr capsule Take 1 capsule (120 mg total) by mouth daily.     Allergies:   Codeine, Lisinopril, Latex, and Losartan   Social History   Socioeconomic History  . Marital status: Married    Spouse name: Not on file  . Number of children: Not on file  . Years of education: Not on file  . Highest education level: Not on file  Occupational History  . Not on file  Tobacco Use  . Smoking status: Former Research scientist (life sciences)  . Smokeless tobacco: Never Used  Substance and Sexual Activity  . Alcohol use: Yes    Alcohol/week: 1.0 standard drinks    Types: 1 Standard drinks or equivalent per week    Comment: occasional wine  . Drug use: No  . Sexual activity: Never  Other Topics Concern  . Not on file  Social History Narrative  . Not on file   Social Determinants of Health   Financial Resource Strain: Low Risk   . Difficulty of Paying Living Expenses: Not hard at all  Food Insecurity: No Food Insecurity  . Worried About Charity fundraiser in the Last Year: Never true  . Ran Out of Food in the Last Year: Never true  Transportation Needs: No Transportation Needs  . Lack of Transportation (Medical): No  . Lack of Transportation (Non-Medical): No  Physical Activity: Inactive  . Days of Exercise per Week: 0 days  . Minutes of Exercise per Session: 0 min  Stress: No Stress Concern Present  . Feeling of Stress : Only a little  Social Connections:   . Frequency of Communication with Friends and Family: Not on file  . Frequency of Social Gatherings with Friends and Family: Not on file  . Attends Religious Services: Not on file  . Active Member of Clubs or Organizations: Not on file  . Attends Archivist Meetings: Not on file    . Marital Status: Not on file     Family History: The patient's family history includes Adrenal disorder in her mother; Breast cancer in an other family member; CAD in her father; Cerebral aneurysm in her sister; Depression in her father; Diabetes in her father and mother; Diverticulosis in her mother.  ROS:   Please see the history of present illness.     All other systems reviewed and are negative.  EKGs/Labs/Other Studies Reviewed:    The following studies were  reviewed today:   EKG:  EKG is  ordered today.  The ekg ordered today demonstrates normal sinus rhythm, nonspecific ST changes.  Recent Labs: 01/30/2019: Hemoglobin 13.8; Platelets 269; TSH 5.461 01/31/2019: ALT 22; BUN 25; Creatinine, Ser 0.77; Potassium 4.3; Sodium 140  Recent Lipid Panel    Component Value Date/Time   CHOL 177 01/31/2019 0736   TRIG 116.0 01/31/2019 0736   HDL 57.90 01/31/2019 0736   CHOLHDL 3 01/31/2019 0736   VLDL 23.2 01/31/2019 0736   LDLCALC 96 01/31/2019 0736   LDLDIRECT 160.7 01/14/2007 0928    Physical Exam:    VS:  BP (!) 142/84 (BP Location: Left Arm, Patient Position: Sitting, Cuff Size: Normal)   Pulse 75   Ht 5' 1.5" (1.562 m)   Wt 201 lb 8 oz (91.4 kg)   SpO2 96%   BMI 37.46 kg/m     Wt Readings from Last 3 Encounters:  02/21/19 201 lb 8 oz (91.4 kg)  02/04/19 202 lb 4 oz (91.7 kg)  01/30/19 200 lb (90.7 kg)     GEN:  Well nourished, well developed in no acute distress HEENT: Normal NECK: No JVD; No carotid bruits LYMPHATICS: No lymphadenopathy CARDIAC: RRR, no murmurs, rubs, gallops RESPIRATORY:  Clear to auscultation without rales, wheezing or rhonchi  ABDOMEN: Soft, non-tender, non-distended MUSCULOSKELETAL:  No edema; No deformity  SKIN: Warm and dry NEUROLOGIC:  Alert and oriented x 3 PSYCHIATRIC:  Normal affect   ASSESSMENT:    1. Essential hypertension   2. Palpitations   3. Pure hypercholesterolemia    PLAN:    In order of problems listed  above:  1. Patient with history of hypertension.  Blood pressure today is still elevated but looks reasonable at 142/84.  Her blood pressure medications were recently titrated.  There is still room for up titration but will defer for now.  Continue amlodipine 10 mg daily, losartan 25 mg twice daily, propanolol, HCTZ 25 mg daily.  Follow-up in 1 month.  If blood pressure still elevated, plan to increase losartan. 2. Patient with palpitations occurring daily.  Symptoms could be atrial arrhythmias or sinus tachycardia.  Will evaluate with cardiac monitor for 2 weeks. 3. History of hyperlipidemia, continue Pravachol as prescribed.  Follow-up in 1 month.  This note was generated in part or whole with voice recognition software. Voice recognition is usually quite accurate but there are transcription errors that can and very often do occur. I apologize for any typographical errors that were not detected and corrected.  Medication Adjustments/Labs and Tests Ordered: Current medicines are reviewed at length with the patient today.  Concerns regarding medicines are outlined above.  Orders Placed This Encounter  Procedures  . LONG TERM MONITOR (3-14 DAYS)  . EKG 12-Lead   No orders of the defined types were placed in this encounter.   Patient Instructions  Medication Instructions:  - Your physician recommends that you continue on your current medications as directed. Please refer to the Current Medication list given to you today.  *If you need a refill on your cardiac medications before your next appointment, please call your pharmacy*  Lab Work: - none ordered  If you have labs (blood work) drawn today and your tests are completely normal, you will receive your results only by: Marland Kitchen MyChart Message (if you have MyChart) OR . A paper copy in the mail If you have any lab test that is abnormal or we need to change your treatment, we will call you to  review the results.  Testing/Procedures: - Your  physician has recommended that you wear a 14 day Zio heart monitor (placed in office today). This monitor is a medical device that records the heart's electrical activity. Doctors most often use these monitors to diagnose arrhythmias. Arrhythmias are problems with the speed or rhythm of the heartbeat. The monitor is a small device applied to your chest. You can wear one while you do your normal daily activities. While wearing this monitor if you have any symptoms to push the button and record what you felt. Once you have worn this monitor for the period of time provider prescribed (Usually 14 days), you will return the monitor device in the postage paid box. Once it is returned they will download the data collected and provide [place] with a report which the provider will then review and we will call you with those results. Important tips:  1. Avoid showering during the first 24 hours of wearing the monitor. 2. Avoid excessive sweating to help maximize wear time. 3. Do not submerge the device, no hot tubs, and no swimming pools. 4. Keep any lotions or oils away from the patch. 5. After 24 hours you may shower with the patch on. Take brief showers with your back facing the shower head.  6. Do not remove patch once it has been placed because that will interrupt data and decrease adhesive wear time. 7. Push the button when you have any symptoms and write down what you were feeling. 8. Once you have completed wearing your monitor, remove and place into box which has postage paid and place in your outgoing mailbox.  9. If for some reason you have misplaced your box then call our office and we can provide another box and/or mail it off for you.        Follow-Up: At [facility], you and your health needs are our priority.  As part of our continuing mission to provide you with exceptional heart care, we have created designated Provider Care Teams.  These Care Teams include your primary Cardiologist  (physician) and Advanced Practice Providers (APPs -  Physician Assistants and Nurse Practitioners) who all work together to provide you with the care you need, when you need it.  Your next appointment:   1 month(s)  The format for your next appointment:   In Person  Provider:   Kate Sable, MD  Other Instructions n/a     Signed, Kate Sable, MD  02/21/2019 12:21 PM    [place]

## 2019-02-21 NOTE — Patient Instructions (Signed)
Medication Instructions:  - Your physician recommends that you continue on your current medications as directed. Please refer to the Current Medication list given to you today.  *If you need a refill on your cardiac medications before your next appointment, please call your pharmacy*  Lab Work: - none ordered  If you have labs (blood work) drawn today and your tests are completely normal, you will receive your results only by: Marland Kitchen MyChart Message (if you have MyChart) OR . A paper copy in the mail If you have any lab test that is abnormal or we need to change your treatment, we will call you to review the results.  Testing/Procedures: - Your physician has recommended that you wear a 14 day Zio heart monitor (placed in office today). This monitor is a medical device that records the heart's electrical activity. Doctors most often use these monitors to diagnose arrhythmias. Arrhythmias are problems with the speed or rhythm of the heartbeat. The monitor is a small device applied to your chest. You can wear one while you do your normal daily activities. While wearing this monitor if you have any symptoms to push the button and record what you felt. Once you have worn this monitor for the period of time provider prescribed (Usually 14 days), you will return the monitor device in the postage paid box. Once it is returned they will download the data collected and provide [place] with a report which the provider will then review and we will call you with those results. Important tips:  1. Avoid showering during the first 24 hours of wearing the monitor. 2. Avoid excessive sweating to help maximize wear time. 3. Do not submerge the device, no hot tubs, and no swimming pools. 4. Keep any lotions or oils away from the patch. 5. After 24 hours you may shower with the patch on. Take brief showers with your back facing the shower head.  6. Do not remove patch once it has been placed because that will interrupt data  and decrease adhesive wear time. 7. Push the button when you have any symptoms and write down what you were feeling. 8. Once you have completed wearing your monitor, remove and place into box which has postage paid and place in your outgoing mailbox.  9. If for some reason you have misplaced your box then call our office and we can provide another box and/or mail it off for you.        Follow-Up: At [facility], you and your health needs are our priority.  As part of our continuing mission to provide you with exceptional heart care, we have created designated Provider Care Teams.  These Care Teams include your primary Cardiologist (physician) and Advanced Practice Providers (APPs -  Physician Assistants and Nurse Practitioners) who all work together to provide you with the care you need, when you need it.  Your next appointment:   1 month(s)  The format for your next appointment:   In Person  Provider:   Kate Sable, MD  Other Instructions n/a

## 2019-02-24 ENCOUNTER — Telehealth: Payer: Self-pay | Admitting: *Deleted

## 2019-02-24 NOTE — Telephone Encounter (Signed)
Patient left a voicemail stating that she has had her cardiology appointment and it went fine. Patient stated that she has a printout of her medications and has questions about a medication that starts with a L that she is to take 25 mg twice a day. Patient stated that the prescription she was given is for 50 mg. Patient needs a call back to discuss.  Called patient and got her voicemail. Left a message for patient to call back.

## 2019-02-24 NOTE — Telephone Encounter (Signed)
Left VM requesting pt to call the office back 

## 2019-02-24 NOTE — Telephone Encounter (Signed)
Patient called back stating that the last script she got on 12/2//21 for Losartan was 50 mg once a day. Patient stated that her medication printout she has shows Losartan 25 mg twice a day and she was taking them at the same time. Patient stated that her medication list may need to be changed to the  correct dose and directions.

## 2019-02-24 NOTE — Telephone Encounter (Signed)
It does not matter whether she takes 50 once daily or two 25s once daily or 25 mg bid  Her choice- should not change BP    What does she prefer?

## 2019-02-26 NOTE — Telephone Encounter (Signed)
Pt notified of Dr. Marliss Coots comments. Pt's taking 50mg  once daily in the am. Med list update

## 2019-03-04 ENCOUNTER — Telehealth: Payer: Self-pay | Admitting: Family Medicine

## 2019-03-04 DIAGNOSIS — E039 Hypothyroidism, unspecified: Secondary | ICD-10-CM

## 2019-03-04 NOTE — Telephone Encounter (Signed)
-----   Message from Cloyd Stagers, RT sent at 02/20/2019  2:34 PM EST ----- Regarding: Lab Orders for Friday 2.19.2021 Please place lab orders for Friday 2.19.2021, appt notes state "1 month for thyroid" Thank you, Dyke Maes RT(R)

## 2019-03-05 ENCOUNTER — Telehealth: Payer: Self-pay

## 2019-03-05 NOTE — Telephone Encounter (Signed)
LVM w COVID screen and lab back info 2.17.2021 TLJ

## 2019-03-05 NOTE — Telephone Encounter (Signed)
LVM to call clinic, need to R/S lab appt 2.17.2021 TLJ 

## 2019-03-07 ENCOUNTER — Other Ambulatory Visit (INDEPENDENT_AMBULATORY_CARE_PROVIDER_SITE_OTHER): Payer: Medicare Other

## 2019-03-07 ENCOUNTER — Other Ambulatory Visit: Payer: Medicare Other

## 2019-03-07 ENCOUNTER — Other Ambulatory Visit: Payer: Self-pay

## 2019-03-07 DIAGNOSIS — E039 Hypothyroidism, unspecified: Secondary | ICD-10-CM

## 2019-03-07 LAB — TSH: TSH: 0.88 u[IU]/mL (ref 0.35–4.50)

## 2019-03-15 DIAGNOSIS — R002 Palpitations: Secondary | ICD-10-CM | POA: Diagnosis not present

## 2019-03-18 ENCOUNTER — Telehealth: Payer: Self-pay | Admitting: Cardiology

## 2019-03-18 NOTE — Telephone Encounter (Signed)
Patient calling to discuss recent zio testing results   Please call    

## 2019-03-18 NOTE — Telephone Encounter (Signed)
Call to patient to review zio results. All questions answered.   Confirmed upcoming appt for 3/11.  Advised pt to call for any further questions or concerns.

## 2019-03-28 ENCOUNTER — Other Ambulatory Visit: Payer: Self-pay

## 2019-03-28 ENCOUNTER — Ambulatory Visit (INDEPENDENT_AMBULATORY_CARE_PROVIDER_SITE_OTHER): Payer: Medicare Other | Admitting: Cardiology

## 2019-03-28 ENCOUNTER — Encounter: Payer: Self-pay | Admitting: Cardiology

## 2019-03-28 VITALS — BP 150/90 | HR 64 | Ht 62.0 in | Wt 205.2 lb

## 2019-03-28 DIAGNOSIS — R002 Palpitations: Secondary | ICD-10-CM | POA: Diagnosis not present

## 2019-03-28 DIAGNOSIS — I10 Essential (primary) hypertension: Secondary | ICD-10-CM

## 2019-03-28 MED ORDER — LOSARTAN POTASSIUM 100 MG PO TABS: 100.0000 mg | ORAL_TABLET | Freq: Every day | ORAL | 5 refills | Status: DC

## 2019-03-28 NOTE — Progress Notes (Signed)
Cardiology Office Note:    Date:  03/28/2019   ID:  Andrea Cooper, DOB [birth year], MRN [id number]  PCP:  Abner Greenspan, MD  Cardiologist:  Kate Sable, MD  Electrophysiologist:  None   Referring MD: Abner Greenspan, MD   Chief Complaint  Patient presents with  . office visit    1 month F/U after wearing ZIO monitor; Meds verbally reviewed with patient.    History of Present Illness:    Andrea Cooper is a 69 y.o. female with a hx of anxiety, diabetes, hypertension who presents for follow-up.  She was last seen  for heart fluttering and hypertension.  Patient had heart fluttering/palpitations for 2 months.  Symptoms usually occur when she is about to go to sleep.  Symptoms typically last about 10 to 15 minutes and occur daily.  She denies any dizziness, presyncope or syncope.  Denies chest pain, shortness of breath, edema.  She also notes difficult to control blood pressures over the past couple of months.  Her blood pressure meds were adjusted with increased doses of amlodipine to 10 mg, losartan was increased to 50 mg daily, propanolol was increased to 120.  Past Medical History:  Diagnosis Date  . Anxiety   . Cataract 2019   corrected with surgery  . Depression   . Diabetes mellitus without complication ([place])   . GERD (gastroesophageal reflux disease)   . History of anxiety   . History of cerebral aneurysm repair    history of cerebral aneurysm   . History of depression   . History of hyperglycemia   . History of hyperlipidemia   . History of hypothyroidism   . History of melanoma   . History of motion sickness   . History of skin cancer    hx of melanoma left lower arm, recurrance  . Hypercholesterolemia   . Hypertension   . Hypothyroidism   . IBS (irritable bowel syndrome)   . Nocturnal oxygen desaturation   . PONV (postoperative nausea and vomiting)   . Thyroid disease     Past Surgical History:  Procedure Laterality Date  . ABDOMINAL HYSTERECTOMY    .  APPENDECTOMY  1966  . BREAST BIOPSY Left 07-29-14    core bx FIBROCYSTIC CHANGES WITH FEW MICROCALCIFICATIONS.  Marland Kitchen cardiolite  12/1997   normal EF 68%  . CATARACT EXTRACTION W/ INTRAOCULAR LENS  IMPLANT, BILATERAL Bilateral 04/2017   [facility]  . CEREBRAL ANEURYSM REPAIR  2008, 2009   coiling at [facility]  . COLONOSCOPY WITH PROPOFOL N/A 01/22/2018   Procedure: COLONOSCOPY WITH PROPOFOL;  Surgeon: Toledo, Benay Pike, MD;  Location: [facility] ENDOSCOPY;  Service: Gastroenterology;  Laterality: N/A;  . EYE SURGERY     RR  . FOOT SURGERY  02/1998   bilateral  . INTRAOPERATIVE ARTERIOGRAM  10/17/2015  . [place]  . TOE SURGERY Left 2015  . TONSILLECTOMY  1964  . TOTAL ABDOMINAL HYSTERECTOMY W/ BILATERAL SALPINGOOPHORECTOMY      Current Medications: Current Meds  Medication Sig  . amLODipine (NORVASC) 10 MG tablet Take 1 tablet (10 mg total) by mouth daily.  Marland Kitchen aspirin EC 81 MG tablet Take 81 mg by mouth daily.  Marland Kitchen buPROPion (WELLBUTRIN XL) 300 MG 24 hr tablet TAKE 1 TABLET DAILY  . calcium carbonate (OSCAL) 1500 (600 Ca) MG TABS tablet Take by mouth 2 (two) times daily with a meal.  . FLUoxetine (PROZAC) 40 MG capsule Take 2 capsules (80 mg total) by mouth  daily.  . glucose blood (ONETOUCH ULTRA) test strip USE 1 STRIP TO CHECK GLUCOSE ONCE DAILYH AS DIRECTED (daignosis code: E11.9)  . hydrochlorothiazide (HYDRODIURIL) 25 MG tablet TAKE 1 TABLET DAILY  . hyoscyamine (LEVSIN SL) 0.125 MG SL tablet Place 0.125 mg under the tongue every 4 (four) hours as needed.  Marland Kitchen levothyroxine (SYNTHROID) 137 MCG tablet TAKE 1 TABLET DAILY BEFORE BREAKFAST  . losartan (COZAAR) 100 MG tablet Take 1 tablet (100 mg total) by mouth daily.  . metFORMIN (GLUCOPHAGE) 500 MG tablet Take 1 tablet (500 mg total) by mouth 2 (two) times daily with a meal.  . omeprazole (PRILOSEC) 20 MG capsule Take 1 capsule (20 mg total) by mouth daily.  [name] DELICA LANCETS 99991111 MISC Check blood sugar twice a day and as  directed. Dx E11.9  . potassium chloride (KLOR-CON) 10 MEQ tablet Take 1 tablet (10 mEq total) by mouth daily.  . pravastatin (PRAVACHOL) 20 MG tablet Take 1 tablet (20 mg total) by mouth daily.  . propranolol ER (INDERAL LA) 120 MG 24 hr capsule Take 1 capsule (120 mg total) by mouth daily.  . [DISCONTINUED] losartan (COZAAR) 50 MG tablet Take 50 mg by mouth daily.     Allergies:   Codeine, Lisinopril, and Latex   Social History   Socioeconomic History  . Marital status: Married    Spouse name: Not on file  . Number of children: Not on file  . Years of education: Not on file  . Highest education level: Not on file  Occupational History  . Not on file  Tobacco Use  . Smoking status: Former Research scientist (life sciences)  . Smokeless tobacco: Never Used  Substance and Sexual Activity  . Alcohol use: Yes    Alcohol/week: 1.0 standard drinks    Types: 1 Standard drinks or equivalent per week    Comment: occasional wine  . Drug use: No  . Sexual activity: Never  Other Topics Concern  . Not on file  Social History Narrative  . Not on file   Social Determinants of Health   Financial Resource Strain: Low Risk   . Difficulty of Paying Living Expenses: Not hard at all  Food Insecurity: No Food Insecurity  . Worried About Charity fundraiser in the Last Year: Never true  . Ran Out of Food in the Last Year: Never true  Transportation Needs: No Transportation Needs  . Lack of Transportation (Medical): No  . Lack of Transportation (Non-Medical): No  Physical Activity: Inactive  . Days of Exercise per Week: 0 days  . Minutes of Exercise per Session: 0 min  Stress: No Stress Concern Present  . Feeling of Stress : Only a little  Social Connections:   . Frequency of Communication with Friends and Family:   . Frequency of Social Gatherings with Friends and Family:   . Attends Religious Services:   . Active Member of Clubs or Organizations:   . Attends Archivist Meetings:   Marland Kitchen Marital Status:        Family History: The patient's family history includes Adrenal disorder in her mother; Breast cancer in an other family member; CAD in her father; Cerebral aneurysm in her sister; Depression in her father; Diabetes in her father and mother; Diverticulosis in her mother.  ROS:   Please see the history of present illness.     All other systems reviewed and are negative.  EKGs/Labs/Other Studies Reviewed:    The following studies were reviewed today:  EKG:  EKG is  ordered today.  The ekg ordered today demonstrates normal sinus rhythm, nonspecific ST changes.  Recent Labs: 01/30/2019: Hemoglobin 13.8; Platelets 269 01/31/2019: ALT 22; BUN 25; Creatinine, Ser 0.77; Potassium 4.3; Sodium 140 03/07/2019: TSH 0.88  Recent Lipid Panel    Component Value Date/Time   CHOL 177 01/31/2019 0736   TRIG 116.0 01/31/2019 0736   HDL 57.90 01/31/2019 0736   CHOLHDL 3 01/31/2019 0736   VLDL 23.2 01/31/2019 0736   LDLCALC 96 01/31/2019 0736   LDLDIRECT 160.7 01/14/2007 0928    Physical Exam:    VS:  BP (!) 150/90 (BP Location: Left Arm, Patient Position: Sitting, Cuff Size: Large)   Pulse 64   Ht 5\' 2"  (1.575 m)   Wt 205 lb 4 oz (93.1 kg)   SpO2 95%   BMI 37.54 kg/m     Wt Readings from Last 3 Encounters:  03/28/19 205 lb 4 oz (93.1 kg)  02/21/19 201 lb 8 oz (91.4 kg)  02/04/19 202 lb 4 oz (91.7 kg)     GEN:  Well nourished, well developed in no acute distress HEENT: Normal NECK: No JVD; No carotid bruits LYMPHATICS: No lymphadenopathy CARDIAC: RRR, no murmurs, rubs, gallops RESPIRATORY:  Clear to auscultation without rales, wheezing or rhonchi  ABDOMEN: Soft, non-tender, non-distended MUSCULOSKELETAL:  No edema; No deformity  SKIN: Warm and dry NEUROLOGIC:  Alert and oriented x 3 PSYCHIATRIC:  Normal affect   ASSESSMENT:    1. Essential hypertension   2. Palpitations    PLAN:    In order of problems listed above:  1. Patient with history of hypertension.  Blood  pressure still elevated. There is still room for up titration.  Increase losartan to 100 mg daily, continue amlodipine 10 mg daily, propanolol, HCTZ 25 mg daily.  Follow-up in 1 month.  2. Patient with palpitations occurring daily.  2-week cardiac monitor showed one episode of SVT lasting 4 beats, no significant arrhythmias were noted.  Patient triggered events were associated with sinus rhythm.  Overall, benign cardiac monitor.  Patient reassured  Follow-up in 1 month.  This note was generated in part or whole with voice recognition software. Voice recognition is usually quite accurate but there are transcription errors that can and very often do occur. I apologize for any typographical errors that were not detected and corrected.  Medication Adjustments/Labs and Tests Ordered: Current medicines are reviewed at length with the patient today.  Concerns regarding medicines are outlined above.  No orders of the defined types were placed in this encounter.  Meds ordered this encounter  Medications  . losartan (COZAAR) 100 MG tablet    Sig: Take 1 tablet (100 mg total) by mouth daily.    Dispense:  30 tablet    Refill:  5    Patient Instructions  Medication Instructions:  Your physician has recommended you make the following change in your medication:   INCREASE Losartan to 100 mg daily. An Rx has been sent to your pharmacy.  *If you need a refill on your cardiac medications before your next appointment, please call your pharmacy*   Lab Work: None ordered If you have labs (blood work) drawn today and your tests are completely normal, you will receive your results only by: Marland Kitchen MyChart Message (if you have MyChart) OR . A paper copy in the mail If you have any lab test that is abnormal or we need to change your treatment, we will call you to review the  results.   Testing/Procedures: None ordered   Follow-Up: At [facility], you and your health needs are our priority.  As part of  our continuing mission to provide you with exceptional heart care, we have created designated Provider Care Teams.  These Care Teams include your primary Cardiologist (physician) and Advanced Practice Providers (APPs -  Physician Assistants and Nurse Practitioners) who all work together to provide you with the care you need, when you need it.  We recommend signing up for the patient portal called "MyChart".  Sign up information is provided on this After Visit Summary.  MyChart is used to connect with patients for Virtual Visits (Telemedicine).  Patients are able to view lab/test results, encounter notes, upcoming appointments, etc.  Non-urgent messages can be sent to your provider as well.   To learn more about what you can do with MyChart, go to [email or web address].    Your next appointment:   4 week(s)  The format for your next appointment:   In Person  Provider:    You may see Kate Sable, MD or one of the following Advanced Practice Providers on your designated Care Team:    Murray Hodgkins, NP  Christell Faith, PA-C  Marrianne Mood, PA-C    Other Instructions N/A     Signed, Kate Sable, MD  03/28/2019 11:29 AM    [place]

## 2019-03-28 NOTE — Patient Instructions (Signed)
Medication Instructions:  Your physician has recommended you make the following change in your medication:   INCREASE Losartan to 100 mg daily. An Rx has been sent to your pharmacy.  *If you need a refill on your cardiac medications before your next appointment, please call your pharmacy*   Lab Work: None ordered If you have labs (blood work) drawn today and your tests are completely normal, you will receive your results only by: Marland Kitchen MyChart Message (if you have MyChart) OR . A paper copy in the mail If you have any lab test that is abnormal or we need to change your treatment, we will call you to review the results.   Testing/Procedures: None ordered   Follow-Up: At [facility], you and your health needs are our priority.  As part of our continuing mission to provide you with exceptional heart care, we have created designated Provider Care Teams.  These Care Teams include your primary Cardiologist (physician) and Advanced Practice Providers (APPs -  Physician Assistants and Nurse Practitioners) who all work together to provide you with the care you need, when you need it.  We recommend signing up for the patient portal called "MyChart".  Sign up information is provided on this After Visit Summary.  MyChart is used to connect with patients for Virtual Visits (Telemedicine).  Patients are able to view lab/test results, encounter notes, upcoming appointments, etc.  Non-urgent messages can be sent to your provider as well.   To learn more about what you can do with MyChart, go to [email or web address].    Your next appointment:   4 week(s)  The format for your next appointment:   In Person  Provider:    You may see Kate Sable, MD or one of the following Advanced Practice Providers on your designated Care Team:    Murray Hodgkins, NP  Christell Faith, PA-C  Marrianne Mood, PA-C    Other Instructions N/A

## 2019-04-02 NOTE — Addendum Note (Signed)
Addended by: Raelene Bott, Iman Orourke L on: 04/02/2019 07:47 AM   Modules accepted: Orders

## 2019-04-21 ENCOUNTER — Telehealth: Payer: Self-pay

## 2019-04-21 NOTE — Telephone Encounter (Signed)
Pt sent mychart msg requesting that her local pharmacy be changed from [place], [address] to [address].  Pharmacy has been updated.

## 2019-04-25 ENCOUNTER — Ambulatory Visit (INDEPENDENT_AMBULATORY_CARE_PROVIDER_SITE_OTHER): Payer: Medicare Other | Admitting: Cardiology

## 2019-04-25 ENCOUNTER — Other Ambulatory Visit: Payer: Self-pay

## 2019-04-25 ENCOUNTER — Encounter: Payer: Self-pay | Admitting: Cardiology

## 2019-04-25 VITALS — BP 130/80 | HR 66 | Ht 62.0 in | Wt 206.0 lb

## 2019-04-25 DIAGNOSIS — E7800 Pure hypercholesterolemia, unspecified: Secondary | ICD-10-CM | POA: Diagnosis not present

## 2019-04-25 DIAGNOSIS — I10 Essential (primary) hypertension: Secondary | ICD-10-CM | POA: Diagnosis not present

## 2019-04-25 NOTE — Patient Instructions (Signed)
Medication Instructions:  Your physician recommends that you continue on your current medications as directed. Please refer to the Current Medication list given to you today.  *If you need a refill on your cardiac medications before your next appointment, please call your pharmacy*  Follow-Up: At [facility], you and your health needs are our priority.  As part of our continuing mission to provide you with exceptional heart care, we have created designated Provider Care Teams.  These Care Teams include your primary Cardiologist (physician) and Advanced Practice Providers (APPs -  Physician Assistants and Nurse Practitioners) who all work together to provide you with the care you need, when you need it.  We recommend signing up for the patient portal called "MyChart".  Sign up information is provided on this After Visit Summary.  MyChart is used to connect with patients for Virtual Visits (Telemedicine).  Patients are able to view lab/test results, encounter notes, upcoming appointments, etc.  Non-urgent messages can be sent to your provider as well.   To learn more about what you can do with MyChart, go to [email or web address].    Your next appointment:   6 month(s)  The format for your next appointment:   In Person  Provider:   Kate Sable, MD

## 2019-04-25 NOTE — Progress Notes (Signed)
Cardiology Office Note:    Date:  04/25/2019   ID:  Andrea Cooper, DOB 05-May-1950, MRN [id number]  PCP:  Abner Greenspan, MD  Cardiologist:  Kate Sable, MD  Electrophysiologist:  None   Referring MD: Abner Greenspan, MD   Chief Complaint  Patient presents with  . office visit    Pt states still having trouble getting dystolic number on BP below 92-96. also states seems like ankles are swelling. Meds verbally reviewed w/ pt.    History of Present Illness:    Andrea Cooper is a 69 y.o. female with a hx of anxiety, diabetes, hypertension who presents for follow-up.  She was last seen due to elevated blood pressures.  Losartan was increased to 100 mg.  She originally thought she has some swelling but does not think so anymore.  She feels much better since increasing the blood pressure medication.  She denies any recurrent dizziness or palpitations..   Past Medical History:  Diagnosis Date  . Anxiety   . Cataract 2019   corrected with surgery  . Depression   . Diabetes mellitus without complication ([place])   . GERD (gastroesophageal reflux disease)   . History of anxiety   . History of cerebral aneurysm repair    history of cerebral aneurysm   . History of depression   . History of hyperglycemia   . History of hyperlipidemia   . History of hypothyroidism   . History of melanoma   . History of motion sickness   . History of skin cancer    hx of melanoma left lower arm, recurrance  . Hypercholesterolemia   . Hypertension   . Hypothyroidism   . IBS (irritable bowel syndrome)   . Nocturnal oxygen desaturation   . PONV (postoperative nausea and vomiting)   . Thyroid disease     Past Surgical History:  Procedure Laterality Date  . ABDOMINAL HYSTERECTOMY    . APPENDECTOMY  1966  . BREAST BIOPSY Left 07-29-14    core bx FIBROCYSTIC CHANGES WITH FEW MICROCALCIFICATIONS.  Marland Kitchen cardiolite  12/1997   normal EF 68%  . CATARACT EXTRACTION W/ INTRAOCULAR LENS  IMPLANT, BILATERAL  Bilateral 04/2017   [facility]  . CEREBRAL ANEURYSM REPAIR  2008, 2009   coiling at [facility]  . COLONOSCOPY WITH PROPOFOL N/A 01/22/2018   Procedure: COLONOSCOPY WITH PROPOFOL;  Surgeon: Toledo, Benay Pike, MD;  Location: [facility] ENDOSCOPY;  Service: Gastroenterology;  Laterality: N/A;  . EYE SURGERY     RR  . FOOT SURGERY  02/1998   bilateral  . INTRAOPERATIVE ARTERIOGRAM  10/17/2015  . [place]  . TOE SURGERY Left 2015  . TONSILLECTOMY  1964  . TOTAL ABDOMINAL HYSTERECTOMY W/ BILATERAL SALPINGOOPHORECTOMY      Current Medications: Current Meds  Medication Sig  . amLODipine (NORVASC) 10 MG tablet Take 1 tablet (10 mg total) by mouth daily.  Marland Kitchen aspirin EC 81 MG tablet Take 81 mg by mouth daily.  Marland Kitchen buPROPion (WELLBUTRIN XL) 300 MG 24 hr tablet TAKE 1 TABLET DAILY  . calcium carbonate (OSCAL) 1500 (600 Ca) MG TABS tablet Take by mouth 2 (two) times daily with a meal.  . FLUoxetine (PROZAC) 40 MG capsule Take 2 capsules (80 mg total) by mouth daily.  Marland Kitchen glucose blood (ONETOUCH ULTRA) test strip USE 1 STRIP TO CHECK GLUCOSE ONCE DAILYH AS DIRECTED (daignosis code: E11.9)  . hydrochlorothiazide (HYDRODIURIL) 25 MG tablet TAKE 1 TABLET DAILY  . hyoscyamine (LEVSIN SL)  0.125 MG SL tablet Place 0.125 mg under the tongue every 4 (four) hours as needed.  Marland Kitchen levothyroxine (SYNTHROID) 137 MCG tablet TAKE 1 TABLET DAILY BEFORE BREAKFAST  . losartan (COZAAR) 100 MG tablet Take 1 tablet (100 mg total) by mouth daily.  . metFORMIN (GLUCOPHAGE) 500 MG tablet Take 1 tablet (500 mg total) by mouth 2 (two) times daily with a meal.  . omeprazole (PRILOSEC) 20 MG capsule Take 1 capsule (20 mg total) by mouth daily.  [name] DELICA LANCETS 99991111 MISC Check blood sugar twice a day and as directed. Dx E11.9  . potassium chloride (KLOR-CON) 10 MEQ tablet Take 1 tablet (10 mEq total) by mouth daily.  . pravastatin (PRAVACHOL) 20 MG tablet Take 1 tablet (20 mg total) by mouth daily.  . propranolol ER  (INDERAL LA) 120 MG 24 hr capsule Take 1 capsule (120 mg total) by mouth daily.     Allergies:   Codeine, Lisinopril, and Latex   Social History   Socioeconomic History  . Marital status: Married    Spouse name: Not on file  . Number of children: Not on file  . Years of education: Not on file  . Highest education level: Not on file  Occupational History  . Not on file  Tobacco Use  . Smoking status: Former Research scientist (life sciences)  . Smokeless tobacco: Never Used  Substance and Sexual Activity  . Alcohol use: Yes    Alcohol/week: 1.0 standard drinks    Types: 1 Standard drinks or equivalent per week    Comment: occasional wine  . Drug use: No  . Sexual activity: Never  Other Topics Concern  . Not on file  Social History Narrative  . Not on file   Social Determinants of Health   Financial Resource Strain: Low Risk   . Difficulty of Paying Living Expenses: Not hard at all  Food Insecurity: No Food Insecurity  . Worried About Charity fundraiser in the Last Year: Never true  . Ran Out of Food in the Last Year: Never true  Transportation Needs: No Transportation Needs  . Lack of Transportation (Medical): No  . Lack of Transportation (Non-Medical): No  Physical Activity: Inactive  . Days of Exercise per Week: 0 days  . Minutes of Exercise per Session: 0 min  Stress: No Stress Concern Present  . Feeling of Stress : Only a little  Social Connections:   . Frequency of Communication with Friends and Family:   . Frequency of Social Gatherings with Friends and Family:   . Attends Religious Services:   . Active Member of Clubs or Organizations:   . Attends Archivist Meetings:   Marland Kitchen Marital Status:      Family History: The patient's family history includes Adrenal disorder in her mother; Breast cancer in an other family member; CAD in her father; Cerebral aneurysm in her sister; Depression in her father; Diabetes in her father and mother; Diverticulosis in her mother.  ROS:     Please see the history of present illness.     All other systems reviewed and are negative.  EKGs/Labs/Other Studies Reviewed:    The following studies were reviewed today:   EKG:  EKG is  ordered today.  The ekg ordered today demonstrates normal sinus rhythm, possible left atrial enlargement.  Recent Labs: 01/30/2019: Hemoglobin 13.8; Platelets 269 01/31/2019: ALT 22; BUN 25; Creatinine, Ser 0.77; Potassium 4.3; Sodium 140 03/07/2019: TSH 0.88  Recent Lipid Panel    Component Value  Date/Time   CHOL 177 01/31/2019 0736   TRIG 116.0 01/31/2019 0736   HDL 57.90 01/31/2019 0736   CHOLHDL 3 01/31/2019 0736   VLDL 23.2 01/31/2019 0736   LDLCALC 96 01/31/2019 0736   LDLDIRECT 160.7 01/14/2007 0928    Physical Exam:    VS:  BP 130/80 (BP Location: Left Arm, Patient Position: Sitting, Cuff Size: Large)   Pulse 66   Ht 5\' 2"  (1.575 m)   Wt 206 lb (93.4 kg)   SpO2 97%   BMI 37.68 kg/m     Wt Readings from Last 3 Encounters:  04/25/19 206 lb (93.4 kg)  03/28/19 205 lb 4 oz (93.1 kg)  02/21/19 201 lb 8 oz (91.4 kg)     GEN:  Well nourished, well developed in no acute distress HEENT: Normal NECK: No JVD; No carotid bruits LYMPHATICS: No lymphadenopathy CARDIAC: RRR, no murmurs, rubs, gallops RESPIRATORY:  Clear to auscultation without rales, wheezing or rhonchi  ABDOMEN: Soft, non-tender, non-distended MUSCULOSKELETAL:  No edema; No deformity  SKIN: Warm and dry NEUROLOGIC:  Alert and oriented x 3 PSYCHIATRIC:  Normal affect   ASSESSMENT:    1. Essential hypertension   2. Pure hypercholesterolemia    PLAN:    In order of problems listed above:  1. Patient with history of hypertension.  Blood pressure better controlled.  Continue losartan 100 mg daily, continue amlodipine 10 mg daily, propanolol, HCTZ 25 mg daily.    2. History of hyperlipidemia, continue statin as prescribed.  Follow-up in 6 months  This note was generated in part or whole with voice recognition  software. Voice recognition is usually quite accurate but there are transcription errors that can and very often do occur. I apologize for any typographical errors that were not detected and corrected.  Medication Adjustments/Labs and Tests Ordered: Current medicines are reviewed at length with the patient today.  Concerns regarding medicines are outlined above.  Orders Placed This Encounter  Procedures  . EKG 12-Lead   No orders of the defined types were placed in this encounter.   Patient Instructions  Medication Instructions:  Your physician recommends that you continue on your current medications as directed. Please refer to the Current Medication list given to you today.  *If you need a refill on your cardiac medications before your next appointment, please call your pharmacy*  Follow-Up: At [facility], you and your health needs are our priority.  As part of our continuing mission to provide you with exceptional heart care, we have created designated Provider Care Teams.  These Care Teams include your primary Cardiologist (physician) and Advanced Practice Providers (APPs -  Physician Assistants and Nurse Practitioners) who all work together to provide you with the care you need, when you need it.  We recommend signing up for the patient portal called "MyChart".  Sign up information is provided on this After Visit Summary.  MyChart is used to connect with patients for Virtual Visits (Telemedicine).  Patients are able to view lab/test results, encounter notes, upcoming appointments, etc.  Non-urgent messages can be sent to your provider as well.   To learn more about what you can do with MyChart, go to [email or web address].    Your next appointment:   6 month(s)  The format for your next appointment:   In Person  Provider:   Kate Sable, MD      Signed, Kate Sable, MD  04/25/2019 1:00 PM    [place]

## 2019-07-03 DIAGNOSIS — Z961 Presence of intraocular lens: Secondary | ICD-10-CM | POA: Diagnosis not present

## 2019-07-03 DIAGNOSIS — E119 Type 2 diabetes mellitus without complications: Secondary | ICD-10-CM | POA: Diagnosis not present

## 2019-07-15 ENCOUNTER — Telehealth: Payer: Self-pay

## 2019-07-15 NOTE — Telephone Encounter (Signed)
She needs to f/u with cardiology- she should be able to make own appt but if she needs ref let me know

## 2019-07-15 NOTE — Telephone Encounter (Signed)
Pt is going to contact Cards to get an appt. If a referral is needed she will call back   Nothing further needed.

## 2019-07-15 NOTE — Telephone Encounter (Signed)
Pt called to see if you would think it would be best for pt to f/u with Cardiology sooner or to come in to see you.... Pt saw you back in March for BP and palpitations... was seen by Cardiology in April, wore Holter monitor was told to f/u in 6 months....   Pt reports her BP has been running 120-130s/100s and HR 70-90 having palpitations again... denies any cardiac Sx, chest pain, tightness, SOB, dizziness etc...Marland Kitchen please advise

## 2019-08-08 ENCOUNTER — Encounter: Payer: Self-pay | Admitting: Cardiology

## 2019-08-08 ENCOUNTER — Ambulatory Visit (INDEPENDENT_AMBULATORY_CARE_PROVIDER_SITE_OTHER): Payer: Medicare Other | Admitting: Cardiology

## 2019-08-08 ENCOUNTER — Other Ambulatory Visit: Payer: Self-pay

## 2019-08-08 VITALS — BP 124/70 | HR 65 | Ht 61.5 in | Wt 189.0 lb

## 2019-08-08 DIAGNOSIS — R002 Palpitations: Secondary | ICD-10-CM

## 2019-08-08 DIAGNOSIS — E7800 Pure hypercholesterolemia, unspecified: Secondary | ICD-10-CM

## 2019-08-08 DIAGNOSIS — I10 Essential (primary) hypertension: Secondary | ICD-10-CM | POA: Diagnosis not present

## 2019-08-08 NOTE — Progress Notes (Signed)
Cardiology Office Note:    Date:  08/08/2019   ID:  Andrea Cooper, DOB 11-20-1950, MRN [id number]  PCP:  Abner Greenspan, MD  Cardiologist:  Kate Sable, MD  Electrophysiologist:  None   Referring MD: Abner Greenspan, MD   Chief Complaint  Patient presents with   office visit    Elevated blood pressure; Meds verbally reviewed with patient.    History of Present Illness:    Andrea Cooper is a 69 y.o. female with a hx of anxiety, diabetes, hypertension who presents for follow-up.  She is being seen due to elevated blood pressures.  Medications were titrated, last visit.  Blood pressures were better controlled with medication titration.  She is tolerating blood pressure medications okay.  She has occasional palpitations which she has had for years.  Previous cardiac monitor did not show any significant arrhythmias.  Likely from anxiety.  Used to see a psychiatrist in [place] but provider not taking medication anymore.  She otherwise feels well.  Blood pressure well controlled.   Past Medical History:  Diagnosis Date   Anxiety    Cataract 2019   corrected with surgery   Depression    Diabetes mellitus without complication ([place])    GERD (gastroesophageal reflux disease)    History of anxiety    History of cerebral aneurysm repair    history of cerebral aneurysm    History of depression    History of hyperglycemia    History of hyperlipidemia    History of hypothyroidism    History of melanoma    History of motion sickness    History of skin cancer    hx of melanoma left lower arm, recurrance   Hypercholesterolemia    Hypertension    Hypothyroidism    IBS (irritable bowel syndrome)    Nocturnal oxygen desaturation    PONV (postoperative nausea and vomiting)    Thyroid disease     Past Surgical History:  Procedure Laterality Date   ABDOMINAL HYSTERECTOMY     APPENDECTOMY  1966   BREAST BIOPSY Left 07-29-14    core bx FIBROCYSTIC CHANGES  WITH FEW MICROCALCIFICATIONS.   cardiolite  12/1997   normal EF 68%   CATARACT EXTRACTION W/ INTRAOCULAR LENS  IMPLANT, BILATERAL Bilateral 04/2017   [place]  2008, 2009   coiling at [place] N/A 01/22/2018   Procedure: COLONOSCOPY WITH PROPOFOL;  Surgeon: Toledo, Benay Pike, MD;  Location: [facility] ENDOSCOPY;  Service: Gastroenterology;  Laterality: N/A;   EYE SURGERY     RR   FOOT SURGERY  02/1998   bilateral   INTRAOPERATIVE ARTERIOGRAM  10/17/2015   MOHS SURGERY  1992   TOE SURGERY Left 2015   TONSILLECTOMY  1964   TOTAL ABDOMINAL HYSTERECTOMY W/ BILATERAL SALPINGOOPHORECTOMY      Current Medications: No outpatient medications have been marked as taking for the 08/08/19 encounter (Office Visit) with Kate Sable, MD.     Allergies:   Codeine, Lisinopril, and Latex   Social History   Socioeconomic History   Marital status: Married    Spouse name: Not on file   Number of children: Not on file   Years of education: Not on file   Highest education level: Not on file  Occupational History   Not on file  Tobacco Use   Smoking status: Former Smoker   Smokeless tobacco: Never Used  Vaping Use   Vaping Use: Never used  Substance and Sexual Activity   Alcohol use: Yes    Alcohol/week: 1.0 standard drink    Types: 1 Standard drinks or equivalent per week    Comment: occasional wine   Drug use: No   Sexual activity: Never  Other Topics Concern   Not on file  Social History Narrative   Not on file   Social Determinants of Health   Financial Resource Strain: Low Risk    Difficulty of Paying Living Expenses: Not hard at all  Food Insecurity: No Food Insecurity   Worried About Charity fundraiser in the Last Year: Never true   [place] in the Last Year: Never true  Transportation Needs: No Transportation Needs   Lack of Transportation (Medical): No   Lack of Transportation  (Non-Medical): No  Physical Activity: Inactive   Days of Exercise per Week: 0 days   Minutes of Exercise per Session: 0 min  Stress: No Stress Concern Present   Feeling of Stress : Only a little  Social Connections:    Frequency of Communication with Friends and Family:    Frequency of Social Gatherings with Friends and Family:    Attends Religious Services:    Active Member of Clubs or Organizations:    Attends Music therapist:    Marital Status:      Family History: The patient's family history includes Adrenal disorder in her mother; Breast cancer in an other family member; CAD in her father; Cerebral aneurysm in her sister; Depression in her father; Diabetes in her father and mother; Diverticulosis in her mother.  ROS:   Please see the history of present illness.     All other systems reviewed and are negative.  EKGs/Labs/Other Studies Reviewed:    The following studies were reviewed today:   EKG:  EKG is  ordered today.  The ekg ordered today demonstrates normal sinus rhythm,   Recent Labs: 01/30/2019: Hemoglobin 13.8; Platelets 269 01/31/2019: ALT 22; BUN 25; Creatinine, Ser 0.77; Potassium 4.3; Sodium 140 03/07/2019: TSH 0.88  Recent Lipid Panel    Component Value Date/Time   CHOL 177 01/31/2019 0736   TRIG 116.0 01/31/2019 0736   HDL 57.90 01/31/2019 0736   CHOLHDL 3 01/31/2019 0736   VLDL 23.2 01/31/2019 0736   LDLCALC 96 01/31/2019 0736   LDLDIRECT 160.7 01/14/2007 0928    Physical Exam:    VS:  BP 124/70 (BP Location: Left Arm, Patient Position: Sitting, Cuff Size: Large)    Pulse 65    Ht 5' 1.5" (1.562 m)    Wt 189 lb (85.7 kg)    SpO2 92%    BMI 35.13 kg/m     Wt Readings from Last 3 Encounters:  08/08/19 189 lb (85.7 kg)  04/25/19 206 lb (93.4 kg)  03/28/19 205 lb 4 oz (93.1 kg)     GEN:  Well nourished, well developed in no acute distress HEENT: Normal NECK: No JVD; No carotid bruits LYMPHATICS: No  lymphadenopathy CARDIAC: RRR, no murmurs, rubs, gallops RESPIRATORY:  Clear to auscultation without rales, wheezing or rhonchi  ABDOMEN: Soft, non-tender, non-distended MUSCULOSKELETAL:  No edema; No deformity  SKIN: Warm and dry NEUROLOGIC:  Alert and oriented x 3 PSYCHIATRIC:  Normal affect   ASSESSMENT:    1. Essential hypertension   2. Pure hypercholesterolemia   3. Palpitations    PLAN:    In order of problems listed above:  1. Patient with history of hypertension.  Blood pressure is well  controlled.  Continue losartan 100 mg daily, continue amlodipine 10 mg daily, propanolol, HCTZ 25 mg daily.    2. History of hyperlipidemia, continue statin as prescribed. 3. History of palpitations, anxiety.  Patient reassured from a cardiac perspective as last cardiac monitor with no significant arrhythmias.  Patient plans to follow-up with primary care regarding anxiety issues.  Follow-up in 6 months-21yr  Total encounter time 35 minutes  Greater than 50% was spent in counseling and coordination of care with the patient   This note was generated in part or whole with voice recognition software. Voice recognition is usually quite accurate but there are transcription errors that can and very often do occur. I apologize for any typographical errors that were not detected and corrected.  Medication Adjustments/Labs and Tests Ordered: Current medicines are reviewed at length with the patient today.  Concerns regarding medicines are outlined above.  Orders Placed This Encounter  Procedures   EKG 12-Lead   No orders of the defined types were placed in this encounter.   Patient Instructions  Medication Instructions:  - Your physician recommends that you continue on your current medications as directed. Please refer to the Current Medication list given to you today.  *If you need a refill on your cardiac medications before your next appointment, please call your pharmacy*   Lab  Work: - none ordered  If you have labs (blood work) drawn today and your tests are completely normal, you will receive your results only by:  [place] (if you have MyChart) OR  A paper copy in the mail If you have any lab test that is abnormal or we need to change your treatment, we will call you to review the results.   Testing/Procedures: - none ordered   Follow-Up: At [facility], you and your health needs are our priority.  As part of our continuing mission to provide you with exceptional heart care, we have created designated Provider Care Teams.  These Care Teams include your primary Cardiologist (physician) and Advanced Practice Providers (APPs -  Physician Assistants and Nurse Practitioners) who all work together to provide you with the care you need, when you need it.  We recommend signing up for the patient portal called "MyChart".  Sign up information is provided on this After Visit Summary.  MyChart is used to connect with patients for Virtual Visits (Telemedicine).  Patients are able to view lab/test results, encounter notes, upcoming appointments, etc.  Non-urgent messages can be sent to your provider as well.   To learn more about what you can do with MyChart, go to [email or web address].    Your next appointment:   6 month(s)  The format for your next appointment:   In Person  Provider:    You may see Kate Sable, MD or one of the following Advanced Practice Providers on your designated Care Team:    Murray Hodgkins, NP  Christell Faith, PA-C  Marrianne Mood, PA-C    Other Instructions n/a     Signed, Kate Sable, MD  08/08/2019 10:58 AM    [place]

## 2019-08-08 NOTE — Patient Instructions (Signed)
Medication Instructions:  - Your physician recommends that you continue on your current medications as directed. Please refer to the Current Medication list given to you today.  *If you need a refill on your cardiac medications before your next appointment, please call your pharmacy*   Lab Work: - none ordered  If you have labs (blood work) drawn today and your tests are completely normal, you will receive your results only by: Marland Kitchen MyChart Message (if you have MyChart) OR . A paper copy in the mail If you have any lab test that is abnormal or we need to change your treatment, we will call you to review the results.   Testing/Procedures: - none ordered   Follow-Up: At [facility], you and your health needs are our priority.  As part of our continuing mission to provide you with exceptional heart care, we have created designated Provider Care Teams.  These Care Teams include your primary Cardiologist (physician) and Advanced Practice Providers (APPs -  Physician Assistants and Nurse Practitioners) who all work together to provide you with the care you need, when you need it.  We recommend signing up for the patient portal called "MyChart".  Sign up information is provided on this After Visit Summary.  MyChart is used to connect with patients for Virtual Visits (Telemedicine).  Patients are able to view lab/test results, encounter notes, upcoming appointments, etc.  Non-urgent messages can be sent to your provider as well.   To learn more about what you can do with MyChart, go to [email or web address].    Your next appointment:   6 month(s)  The format for your next appointment:   In Person  Provider:    You may see Kate Sable, MD or one of the following Advanced Practice Providers on your designated Care Team:    Murray Hodgkins, NP  Christell Faith, PA-C  Marrianne Mood, PA-C    Other Instructions n/a

## 2019-08-14 ENCOUNTER — Other Ambulatory Visit: Payer: Self-pay | Admitting: Family Medicine

## 2019-08-14 DIAGNOSIS — Z1231 Encounter for screening mammogram for malignant neoplasm of breast: Secondary | ICD-10-CM

## 2019-08-20 ENCOUNTER — Other Ambulatory Visit: Payer: Self-pay | Admitting: *Deleted

## 2019-08-20 MED ORDER — AMLODIPINE BESYLATE 10 MG PO TABS: 10.0000 mg | ORAL_TABLET | Freq: Every day | ORAL | 0 refills | Status: DC

## 2019-08-20 MED ORDER — PROPRANOLOL HCL ER 120 MG PO CP24: 120.0000 mg | ORAL_CAPSULE | Freq: Every day | ORAL | 0 refills | Status: DC

## 2019-08-20 NOTE — Telephone Encounter (Signed)
Rxs sent

## 2019-08-20 NOTE — Telephone Encounter (Signed)
Patient called stating that she needs refills sent to her mail order pharmacy for Amlodipine 10 mg and  Propranolol 120 mg. Patient stated that she does not want to get them from her local pharmacy. Pharmacy [facility]

## 2019-08-22 ENCOUNTER — Telehealth: Payer: Self-pay | Admitting: Cardiology

## 2019-08-22 MED ORDER — LOSARTAN POTASSIUM 100 MG PO TABS: 100.0000 mg | ORAL_TABLET | Freq: Every day | ORAL | 3 refills | Status: DC

## 2019-08-22 NOTE — Telephone Encounter (Signed)
Refill sent to mail order. 

## 2019-08-22 NOTE — Telephone Encounter (Signed)
*  STAT* If patient is at the pharmacy, call can be transferred to refill team.   1. Which medications need to be refilled? (please list name of each medication and dose if known) losartan 100 MG 1 tablet daily  2. Which pharmacy/location (including street and city if local pharmacy) is medication to be sent to? [facility] - fax: [phone]  3. Do they need a 30 day or 90 day supply? 90 day

## 2019-09-08 ENCOUNTER — Telehealth: Payer: Self-pay

## 2019-09-08 MED ORDER — ONETOUCH ULTRA VI STRP: ORAL_STRIP | 1 refills | Status: DC

## 2019-09-08 NOTE — Telephone Encounter (Signed)
No record of refill request being sent but Rx refilled now and left VM letting pt know

## 2019-09-08 NOTE — Telephone Encounter (Signed)
Pt left v/m that [facility] [address] advised pt has not had response for 2 refill request for test strips; pt request cb.

## 2019-09-09 DIAGNOSIS — Z23 Encounter for immunization: Secondary | ICD-10-CM | POA: Diagnosis not present

## 2019-09-29 ENCOUNTER — Other Ambulatory Visit: Payer: Self-pay

## 2019-09-29 ENCOUNTER — Ambulatory Visit
Admission: RE | Admit: 2019-09-29 | Discharge: 2019-09-29 | Disposition: A | Discharge status: Home / Self Care | Payer: Medicare Other | Source: Ambulatory Visit | Attending: Family Medicine | Admitting: Family Medicine

## 2019-09-29 DIAGNOSIS — Z1231 Encounter for screening mammogram for malignant neoplasm of breast: Secondary | ICD-10-CM | POA: Diagnosis not present

## 2019-10-06 ENCOUNTER — Telehealth: Payer: Self-pay | Admitting: *Deleted

## 2019-10-06 DIAGNOSIS — E039 Hypothyroidism, unspecified: Secondary | ICD-10-CM

## 2019-10-06 NOTE — Telephone Encounter (Signed)
Lab appt scheduled.

## 2019-10-06 NOTE — Telephone Encounter (Signed)
Patient left a voicemail stating that she is having a problem with excessive hair loss. Patient stated the last time that this happened her thyroid medication had to be adjusted. Patient wants to know if she should come in and have lab work done to check her thyroid?

## 2019-10-06 NOTE — Telephone Encounter (Signed)
I ordered a TSH Please schedule a non fasting blood draw

## 2019-10-08 ENCOUNTER — Other Ambulatory Visit (INDEPENDENT_AMBULATORY_CARE_PROVIDER_SITE_OTHER): Payer: Medicare Other

## 2019-10-08 ENCOUNTER — Other Ambulatory Visit: Payer: Self-pay

## 2019-10-08 DIAGNOSIS — E039 Hypothyroidism, unspecified: Secondary | ICD-10-CM | POA: Diagnosis not present

## 2019-10-08 LAB — TSH: TSH: 1.59 u[IU]/mL (ref 0.35–4.50)

## 2019-10-27 ENCOUNTER — Ambulatory Visit: Payer: Medicare Other | Admitting: Cardiology

## 2019-11-07 ENCOUNTER — Other Ambulatory Visit: Payer: Self-pay | Admitting: Family Medicine

## 2020-01-14 DIAGNOSIS — L650 Telogen effluvium: Secondary | ICD-10-CM | POA: Diagnosis not present

## 2020-01-14 DIAGNOSIS — L821 Other seborrheic keratosis: Secondary | ICD-10-CM | POA: Diagnosis not present

## 2020-01-14 DIAGNOSIS — D2262 Melanocytic nevi of left upper limb, including shoulder: Secondary | ICD-10-CM | POA: Diagnosis not present

## 2020-01-14 DIAGNOSIS — Z85820 Personal history of malignant melanoma of skin: Secondary | ICD-10-CM | POA: Diagnosis not present

## 2020-01-14 DIAGNOSIS — D485 Neoplasm of uncertain behavior of skin: Secondary | ICD-10-CM | POA: Diagnosis not present

## 2020-01-14 DIAGNOSIS — C44311 Basal cell carcinoma of skin of nose: Secondary | ICD-10-CM | POA: Diagnosis not present

## 2020-01-14 DIAGNOSIS — D2271 Melanocytic nevi of right lower limb, including hip: Secondary | ICD-10-CM | POA: Diagnosis not present

## 2020-01-14 DIAGNOSIS — D2261 Melanocytic nevi of right upper limb, including shoulder: Secondary | ICD-10-CM | POA: Diagnosis not present

## 2020-01-14 DIAGNOSIS — D2371 Other benign neoplasm of skin of right lower limb, including hip: Secondary | ICD-10-CM | POA: Diagnosis not present

## 2020-01-17 ENCOUNTER — Other Ambulatory Visit: Payer: Self-pay | Admitting: Family Medicine

## 2020-01-30 ENCOUNTER — Other Ambulatory Visit: Payer: Self-pay | Admitting: Family Medicine

## 2020-01-30 NOTE — Telephone Encounter (Signed)
Med refilled once and Carrie will reach out to pt to try and get appt scheduled  

## 2020-01-30 NOTE — Telephone Encounter (Signed)
Please schedule annual exam and refill until then  

## 2020-01-30 NOTE — Telephone Encounter (Signed)
Pt hasn't been seen in almost a year and no future appts. Please advise

## 2020-02-04 ENCOUNTER — Telehealth: Payer: Self-pay | Admitting: Cardiology

## 2020-02-04 NOTE — Telephone Encounter (Signed)
Patient declined to keep fu states no longer having issues. She wants to fu prn  and will contact pcp for future refills

## 2020-02-09 ENCOUNTER — Ambulatory Visit: Payer: Medicare Other | Admitting: Cardiology

## 2020-03-11 ENCOUNTER — Telehealth: Payer: Self-pay

## 2020-03-11 NOTE — Telephone Encounter (Signed)
Pt lmovm requesting advice on whether she should be tested for covid. Pt was exposed last Sunday and woke up this morning with a scratchy throat... Pt then called the office back to let us know that she had received rapid tests in the mail and used the covid rapid test and it was negative.... Pt advised per guidelines, should wait until after 5 days... Pt states she is home and would retest tomorrow or Saturday to confirm... information given to pt on what to be on the look out for and that we can do video virtual visit for evaluation if needed

## 2020-03-12 DIAGNOSIS — Z03818 Encounter for observation for suspected exposure to other biological agents ruled out: Secondary | ICD-10-CM | POA: Diagnosis not present

## 2020-03-12 DIAGNOSIS — Z20822 Contact with and (suspected) exposure to covid-19: Secondary | ICD-10-CM | POA: Diagnosis not present

## 2020-03-15 DIAGNOSIS — Z85828 Personal history of other malignant neoplasm of skin: Secondary | ICD-10-CM | POA: Diagnosis not present

## 2020-03-15 DIAGNOSIS — L814 Other melanin hyperpigmentation: Secondary | ICD-10-CM | POA: Diagnosis not present

## 2020-03-15 DIAGNOSIS — L988 Other specified disorders of the skin and subcutaneous tissue: Secondary | ICD-10-CM | POA: Diagnosis not present

## 2020-03-15 DIAGNOSIS — L578 Other skin changes due to chronic exposure to nonionizing radiation: Secondary | ICD-10-CM | POA: Diagnosis not present

## 2020-03-15 DIAGNOSIS — C44311 Basal cell carcinoma of skin of nose: Secondary | ICD-10-CM | POA: Diagnosis not present

## 2020-03-23 ENCOUNTER — Ambulatory Visit (INDEPENDENT_AMBULATORY_CARE_PROVIDER_SITE_OTHER): Payer: Medicare Other

## 2020-03-23 ENCOUNTER — Telehealth: Payer: Self-pay | Admitting: Family Medicine

## 2020-03-23 ENCOUNTER — Other Ambulatory Visit: Payer: Self-pay

## 2020-03-23 DIAGNOSIS — E039 Hypothyroidism, unspecified: Secondary | ICD-10-CM

## 2020-03-23 DIAGNOSIS — E119 Type 2 diabetes mellitus without complications: Secondary | ICD-10-CM

## 2020-03-23 DIAGNOSIS — E785 Hyperlipidemia, unspecified: Secondary | ICD-10-CM

## 2020-03-23 DIAGNOSIS — E1169 Type 2 diabetes mellitus with other specified complication: Secondary | ICD-10-CM

## 2020-03-23 DIAGNOSIS — Z0000 Encounter for general adult medical examination without abnormal findings: Secondary | ICD-10-CM

## 2020-03-23 DIAGNOSIS — Z79899 Other long term (current) drug therapy: Secondary | ICD-10-CM

## 2020-03-23 DIAGNOSIS — I10 Essential (primary) hypertension: Secondary | ICD-10-CM

## 2020-03-23 NOTE — Telephone Encounter (Signed)
-----   Message from Ellamae Sia sent at 03/08/2020 10:53 AM EST ----- Regarding: Lab orders for Thursday, 3.10.22 Patient is scheduled for CPX labs, please order future labs, Thanks , Karna Christmas

## 2020-03-23 NOTE — Patient Instructions (Signed)
Andrea Cooper , Thank you for taking time to come for your Medicare Wellness Visit. I appreciate your ongoing commitment to your health goals. Please review the following plan we discussed and let me know if I can assist you in the future.   Screening recommendations/referrals: Colonoscopy: Up to date, completed 01/22/2018, due 01/2028 Mammogram: Up to date, completed 09/29/2019, due 09/2020 Bone Density: Up to date, completed 03/23/2016, due 2-5 years  Recommended yearly ophthalmology/optometry visit for glaucoma screening and checkup Recommended yearly dental visit for hygiene and checkup  Vaccinations: Influenza vaccine: Up to date, completed 09/09/2019, due 08/2020 Pneumococcal vaccine: Completed series Tdap vaccine: decline-insurance  Shingles vaccine: Completed series per patient   Covid-19:Completed series  Advanced directives: Please bring a copy of your POA (Power of Attorney) and/or Living Will to your next appointment.   Conditions/risks identified: diabetes, hyperlipidemia   Next appointment: Follow up in one year for your annual wellness visit    Preventive Care 34 Years and Older, Female Preventive care refers to lifestyle choices and visits with your health care provider that can promote health and wellness. What does preventive care include?  A yearly physical exam. This is also called an annual well check.  Dental exams once or twice a year.  Routine eye exams. Ask your health care provider how often you should have your eyes checked.  Personal lifestyle choices, including:  Daily care of your teeth and gums.  Regular physical activity.  Eating a healthy diet.  Avoiding tobacco and drug use.  Limiting alcohol use.  Practicing safe sex.  Taking low-dose aspirin every day.  Taking vitamin and mineral supplements as recommended by your health care provider. What happens during an annual well check? The services and screenings done by your health care provider  during your annual well check will depend on your age, overall health, lifestyle risk factors, and family history of disease. Counseling  Your health care provider may ask you questions about your:  Alcohol use.  Tobacco use.  Drug use.  Emotional well-being.  Home and relationship well-being.  Sexual activity.  Eating habits.  History of falls.  Memory and ability to understand (cognition).  Work and work Statistician.  Reproductive health. Screening  You may have the following tests or measurements:  Height, weight, and BMI.  Blood pressure.  Lipid and cholesterol levels. These may be checked every 5 years, or more frequently if you are over 62 years old.  Skin check.  Lung cancer screening. You may have this screening every year starting at age 46 if you have a 30-pack-year history of smoking and currently smoke or have quit within the past 15 years.  Fecal occult blood test (FOBT) of the stool. You may have this test every year starting at age 46.  Flexible sigmoidoscopy or colonoscopy. You may have a sigmoidoscopy every 5 years or a colonoscopy every 10 years starting at age 37.  Hepatitis C blood test.  Hepatitis B blood test.  Sexually transmitted disease (STD) testing.  Diabetes screening. This is done by checking your blood sugar (glucose) after you have not eaten for a while (fasting). You may have this done every 1-3 years.  Bone density scan. This is done to screen for osteoporosis. You may have this done starting at age 33.  Mammogram. This may be done every 1-2 years. Talk to your health care provider about how often you should have regular mammograms. Talk with your health care provider about your test results, treatment options, and  if necessary, the need for more tests. Vaccines  Your health care provider may recommend certain vaccines, such as:  Influenza vaccine. This is recommended every year.  Tetanus, diphtheria, and acellular pertussis  (Tdap, Td) vaccine. You may need a Td booster every 10 years.  Zoster vaccine. You may need this after age 66.  Pneumococcal 13-valent conjugate (PCV13) vaccine. One dose is recommended after age 69.  Pneumococcal polysaccharide (PPSV23) vaccine. One dose is recommended after age 66. Talk to your health care provider about which screenings and vaccines you need and how often you need them. This information is not intended to replace advice given to you by your health care provider. Make sure you discuss any questions you have with your health care provider. Document Released: 01/29/2015 Document Revised: 09/22/2015 Document Reviewed: 11/03/2014 Elsevier Interactive Patient Education  2017 [place] Prevention in the Home Falls can cause injuries. They can happen to people of all ages. There are many things you can do to make your home safe and to help prevent falls. What can I do on the outside of my home?  Regularly fix the edges of walkways and driveways and fix any cracks.  Remove anything that might make you trip as you walk through a door, such as a raised step or threshold.  Trim any bushes or trees on the path to your home.  Use bright outdoor lighting.  Clear any walking paths of anything that might make someone trip, such as rocks or tools.  Regularly check to see if handrails are loose or broken. Make sure that both sides of any steps have handrails.  Any raised decks and porches should have guardrails on the edges.  Have any leaves, snow, or ice cleared regularly.  Use sand or salt on walking paths during winter.  Clean up any spills in your garage right away. This includes oil or grease spills. What can I do in the bathroom?  Use night lights.  Install grab bars by the toilet and in the tub and shower. Do not use towel bars as grab bars.  Use non-skid mats or decals in the tub or shower.  If you need to sit down in the shower, use a plastic, non-slip  stool.  Keep the floor dry. Clean up any water that spills on the floor as soon as it happens.  Remove soap buildup in the tub or shower regularly.  Attach bath mats securely with double-sided non-slip rug tape.  Do not have throw rugs and other things on the floor that can make you trip. What can I do in the bedroom?  Use night lights.  Make sure that you have a light by your bed that is easy to reach.  Do not use any sheets or blankets that are too big for your bed. They should not hang down onto the floor.  Have a firm chair that has side arms. You can use this for support while you get dressed.  Do not have throw rugs and other things on the floor that can make you trip. What can I do in the kitchen?  Clean up any spills right away.  Avoid walking on wet floors.  Keep items that you use a lot in easy-to-reach places.  If you need to reach something above you, use a strong step stool that has a grab bar.  Keep electrical cords out of the way.  Do not use floor polish or wax that makes floors slippery. If you  must use wax, use non-skid floor wax.  Do not have throw rugs and other things on the floor that can make you trip. What can I do with my stairs?  Do not leave any items on the stairs.  Make sure that there are handrails on both sides of the stairs and use them. Fix handrails that are broken or loose. Make sure that handrails are as long as the stairways.  Check any carpeting to make sure that it is firmly attached to the stairs. Fix any carpet that is loose or worn.  Avoid having throw rugs at the top or bottom of the stairs. If you do have throw rugs, attach them to the floor with carpet tape.  Make sure that you have a light switch at the top of the stairs and the bottom of the stairs. If you do not have them, ask someone to add them for you. What else can I do to help prevent falls?  Wear shoes that:  Do not have high heels.  Have rubber bottoms.  Are  comfortable and fit you well.  Are closed at the toe. Do not wear sandals.  If you use a stepladder:  Make sure that it is fully opened. Do not climb a closed stepladder.  Make sure that both sides of the stepladder are locked into place.  Ask someone to hold it for you, if possible.  Clearly mark and make sure that you can see:  Any grab bars or handrails.  First and last steps.  Where the edge of each step is.  Use tools that help you move around (mobility aids) if they are needed. These include:  Canes.  Walkers.  Scooters.  Crutches.  Turn on the lights when you go into a dark area. Replace any light bulbs as soon as they burn out.  Set up your furniture so you have a clear path. Avoid moving your furniture around.  If any of your floors are uneven, fix them.  If there are any pets around you, be aware of where they are.  Review your medicines with your doctor. Some medicines can make you feel dizzy. This can increase your chance of falling. Ask your doctor what other things that you can do to help prevent falls. This information is not intended to replace advice given to you by your health care provider. Make sure you discuss any questions you have with your health care provider. Document Released: 10/29/2008 Document Revised: 06/10/2015 Document Reviewed: 02/06/2014 Elsevier Interactive Patient Education  2017 [employer].

## 2020-03-23 NOTE — Progress Notes (Signed)
PCP notes:  Health Maintenance: Foot exam- due   Abnormal Screenings: none   Patient concerns: none   Nurse concerns: none   Next PCP appt.: 04/02/2020 @ 8:30 am

## 2020-03-23 NOTE — Progress Notes (Signed)
Subjective:   Andrea Cooper is a 70 y.o. female who presents for Medicare Annual (Subsequent) preventive examination.  Review of Systems: N/A      I connected with the patient today by telephone and verified that I am speaking with the correct person using two identifiers. Location patient: home Location nurse: work Persons participating in the telephone visit: patient, nurse.   I discussed the limitations, risks, security and privacy concerns of performing an evaluation and management service by telephone and the availability of in person appointments. I also discussed with the patient that there may be a patient responsible charge related to this service. The patient expressed understanding and verbally consented to this telephonic visit.        Cardiac Risk Factors include: advanced age (>5men, >58 women);diabetes mellitus;Other (see comment), Risk factor comments: hyperlipidemia     Objective:    Today's Vitals   There is no height or weight on file to calculate BMI.  Advanced Directives 03/23/2020 01/31/2019 01/25/2018 01/22/2018 12/14/2017 01/19/2017 01/19/2016  Does Patient Have a Medical Advance Directive? Yes Yes Yes Yes Yes No Yes  Type of Paramedic of [place];Living will [place];Living will [place];Living will - Living will - [place];Living will  Copy of [place] in Chart? No - copy requested No - copy requested No - copy requested - - - No - copy requested  Would patient like information on creating a medical advance directive? - - - - No - Patient declined No - Patient declined -    Current Medications (verified) Outpatient Encounter Medications as of 03/23/2020  Medication Sig  . amLODipine (NORVASC) 10 MG tablet TAKE 1 TABLET DAILY  . aspirin EC 81 MG tablet Take 81 mg by mouth daily.  Marland Kitchen buPROPion (WELLBUTRIN XL) 300 MG 24 hr tablet Take 1 tablet (300 mg total) by  mouth daily. NEEDS OFFICE VISIT  . calcium carbonate (OSCAL) 1500 (600 Ca) MG TABS tablet Take by mouth 2 (two) times daily with a meal.  . FLUoxetine (PROZAC) 40 MG capsule Take 2 capsules (80 mg total) by mouth daily.  Marland Kitchen glucose blood (ONETOUCH ULTRA) test strip USE 1 STRIP TO CHECK GLUCOSE ONCE DAILYH AS DIRECTED (daignosis code: E11.9)  . hydrochlorothiazide (HYDRODIURIL) 25 MG tablet TAKE 1 TABLET DAILY  . hyoscyamine (LEVSIN SL) 0.125 MG SL tablet Place 0.125 mg under the tongue every 4 (four) hours as needed.  Marland Kitchen levothyroxine (SYNTHROID) 137 MCG tablet TAKE 1 TABLET DAILY BEFORE BREAKFAST  . losartan (COZAAR) 100 MG tablet Take 1 tablet (100 mg total) by mouth daily.  . metFORMIN (GLUCOPHAGE) 500 MG tablet Take 1 tablet (500 mg total) by mouth 2 (two) times daily with a meal.  . omeprazole (PRILOSEC) 20 MG capsule Take 1 capsule (20 mg total) by mouth daily.  [name] DELICA LANCETS 26R MISC Check blood sugar twice a day and as directed. Dx E11.9  . potassium chloride (KLOR-CON) 10 MEQ tablet Take 1 tablet (10 mEq total) by mouth daily.  . pravastatin (PRAVACHOL) 20 MG tablet Take 1 tablet (20 mg total) by mouth daily.  . propranolol ER (INDERAL LA) 120 MG 24 hr capsule TAKE 1 CAPSULE DAILY   No facility-administered encounter medications on file as of 03/23/2020.    Allergies (verified) Codeine, Lisinopril, and Latex   History: Past Medical History:  Diagnosis Date  . Anxiety   . Cataract 2019   corrected with surgery  .  Depression   . Diabetes mellitus without complication ([place])   . GERD (gastroesophageal reflux disease)   . History of anxiety   . History of cerebral aneurysm repair    history of cerebral aneurysm   . History of depression   . History of hyperglycemia   . History of hyperlipidemia   . History of hypothyroidism   . History of melanoma   . History of motion sickness   . History of skin cancer    hx of melanoma left lower arm, recurrance  .  Hypercholesterolemia   . Hypertension   . Hypothyroidism   . IBS (irritable bowel syndrome)   . Nocturnal oxygen desaturation   . PONV (postoperative nausea and vomiting)   . Thyroid disease    Past Surgical History:  Procedure Laterality Date  . ABDOMINAL HYSTERECTOMY    . APPENDECTOMY  1966  . BREAST BIOPSY Left 07-29-14    core bx FIBROCYSTIC CHANGES WITH FEW MICROCALCIFICATIONS.  Marland Kitchen cardiolite  12/1997   normal EF 68%  . CATARACT EXTRACTION W/ INTRAOCULAR LENS  IMPLANT, BILATERAL Bilateral 04/2017   [facility]  . CEREBRAL ANEURYSM REPAIR  2008, 2009   coiling at [facility]  . COLONOSCOPY WITH PROPOFOL N/A 01/22/2018   Procedure: COLONOSCOPY WITH PROPOFOL;  Surgeon: Toledo, Benay Pike, MD;  Location: [facility] ENDOSCOPY;  Service: Gastroenterology;  Laterality: N/A;  . EYE SURGERY     RR  . FOOT SURGERY  02/1998   bilateral  . INTRAOPERATIVE ARTERIOGRAM  10/17/2015  . [place]  . TOE SURGERY Left 2015  . TONSILLECTOMY  1964  . TOTAL ABDOMINAL HYSTERECTOMY W/ BILATERAL SALPINGOOPHORECTOMY     Family History  Problem Relation Age of Onset  . Diabetes Father   . Depression Father   . CAD Father   . Diabetes Mother   . Diverticulosis Mother   . Adrenal disorder Mother   . Breast cancer Other        distant relatives (all on mother's side)  . Cerebral aneurysm Sister    Social History   Socioeconomic History  . Marital status: Married    Spouse name: Not on file  . Number of children: Not on file  . Years of education: Not on file  . Highest education level: Not on file  Occupational History  . Not on file  Tobacco Use  . Smoking status: Former Research scientist (life sciences)  . Smokeless tobacco: Never Used  Vaping Use  . Vaping Use: Never used  Substance and Sexual Activity  . Alcohol use: Yes    Alcohol/week: 1.0 standard drink    Types: 1 Standard drinks or equivalent per week    Comment: occasional wine  . Drug use: No  . Sexual activity: Never  Other Topics Concern  . Not  on file  Social History Narrative  . Not on file   Social Determinants of Health   Financial Resource Strain: Low Risk   . Difficulty of Paying Living Expenses: Not hard at all  Food Insecurity: No Food Insecurity  . Worried About Charity fundraiser in the Last Year: Never true  . Ran Out of Food in the Last Year: Never true  Transportation Needs: No Transportation Needs  . Lack of Transportation (Medical): No  . Lack of Transportation (Non-Medical): No  Physical Activity: Inactive  . Days of Exercise per Week: 0 days  . Minutes of Exercise per Session: 0 min  Stress: No Stress Concern Present  . Feeling of  Stress : Not at all  Social Connections: Not on file    Tobacco Counseling Counseling given: Not Answered   Clinical Intake:  Pre-visit preparation completed: Yes  Pain : No/denies pain     Nutritional Risks: None Diabetes: Yes CBG done?: No Did pt. bring in CBG monitor from home?: No  How often do you need to have someone help you when you read instructions, pamphlets, or other written materials from your doctor or pharmacy?: 1 - Never What is the last grade level you completed in school?: some college  Diabetic: Yes Nutrition Risk Assessment:  Has the patient had any N/V/D within the last 2 months?  No  Does the patient have any non-healing wounds?  No  Has the patient had any unintentional weight loss or weight gain?  No   Diabetes:  Is the patient diabetic?  Yes  If diabetic, was a CBG obtained today?  No telephone visit Did the patient bring in their glucometer from home?  No telephone visit  How often do you monitor your CBG's? occasionally.   Financial Strains and Diabetes Management:  Are you having any financial strains with the device, your supplies or your medication? No .  Does the patient want to be seen by Chronic Care Management for management of their diabetes?  No  Would the patient like to be referred to a Nutritionist or for Diabetic  Management?  No   Diabetic Exams:  Diabetic Eye Exam: Completed 06/17/2019 Diabetic Foot Exam: Overdue, Pt has been advised about the importance in completing this exam. Pt is scheduled for diabetic foot exam on 04/02/2020.   Interpreter Needed?: No  Information entered by :: CJohnson, LPN   Activities of Daily Living In your present state of health, do you have any difficulty performing the following activities: 03/23/2020  Hearing? N  Vision? N  Difficulty concentrating or making decisions? N  Walking or climbing stairs? N  Dressing or bathing? N  Doing errands, shopping? N  Preparing Food and eating ? N  Using the Toilet? N  In the past six months, have you accidently leaked urine? N  Do you have problems with loss of bowel control? N  Managing your Medications? N  Managing your Finances? N  Housekeeping or managing your Housekeeping? N  Some recent data might be hidden    Patient Care Team: Tower, Wynelle Fanny, MD as PCP - General Kate Sable, MD as PCP - Cardiology (Cardiology) Bary Castilla, Forest Gleason, MD (General Surgery) Chucky May, MD as Consulting Physician (Psychiatry) Hoy Finlay, MD as Referring Physician (Neurosurgery) Christiane Ha as Consulting Physician (Optometry) Rolm Bookbinder, MD as Consulting Physician (Dermatology)  Indicate any recent Medical Services you may have received from other than [facility] providers in the past year (date may be approximate).     Assessment:   This is a routine wellness examination for Asuna.  Hearing/Vision screen  Hearing Screening   125Hz  250Hz  500Hz  1000Hz  2000Hz  3000Hz  4000Hz  6000Hz  8000Hz   Right ear:           Left ear:           Vision Screening Comments: Patient get annual eye exams   Dietary issues and exercise activities discussed: Current Exercise Habits: The patient does not participate in regular exercise at present, Exercise limited by: None identified  Goals    . Increase physical activity      Starting 01/26/2018,I will attempt to walk for 20 minutes 3 days per week.     Marland Kitchen  Patient Stated     01/31/2019, I will work on exercising more daily.     . Patient Stated     03/23/2020, I will maintain and continue medications as prescribed.       Depression Screen PHQ 2/9 Scores 03/23/2020 01/31/2019 01/25/2018 01/19/2017 01/19/2016 05/24/2015 07/14/2014  PHQ - 2 Score 2 0 2 0 0 0 0  PHQ- 9 Score 2 0 9 0 - - -    Fall Risk Fall Risk  03/23/2020 01/31/2019 01/25/2018 01/19/2017 01/19/2016  Falls in the past year? 0 0 1 No Yes  Comment - - Thanksgiving 2019 fell as a result of wet leaves on sidewalk; injury to face; treatment in ER - -  Number falls in past yr: 0 0 0 - 2 or more  Injury with Fall? 0 0 1 - No  Risk Factor Category  - - - - High Fall Risk  Comment - - - - pt is being referred to PCP for further evaluation  Risk for fall due to : Medication side effect Medication side effect - - -  Follow up Falls evaluation completed;Falls prevention discussed Falls evaluation completed;Falls prevention discussed - - -    FALL RISK PREVENTION PERTAINING TO THE HOME:  Any stairs in or around the home? Yes  If so, are there any without handrails? No  Home free of loose throw rugs in walkways, pet beds, electrical cords, etc? Yes  Adequate lighting in your home to reduce risk of falls? Yes   ASSISTIVE DEVICES UTILIZED TO PREVENT FALLS:  Life alert? No  Use of a cane, walker or w/c? No  Grab bars in the bathroom? No  Shower chair or bench in shower? No  Elevated toilet seat or a handicapped toilet? No   TIMED UP AND GO:  Was the test performed? N/A telephone visit .    Cognitive Function: MMSE - Mini Mental State Exam 03/23/2020 01/31/2019 01/25/2018 01/19/2017 01/19/2016  Not completed: Refused - - - -  Orientation to time - 5 5 5 5   Orientation to Place - 5 5 5 5   Registration - 3 3 3 3   Attention/ Calculation - 5 0 0 0  Recall - 3 3 3 3   Language- name 2 objects - - 0 0 0  Language- repeat  - 1 1 1 1   Language- follow 3 step command - - 3 3 3   Language- read & follow direction - - 0 0 0  Write a sentence - - 0 0 0  Copy design - - 0 0 0  Total score - - 20 20 20   Mini Cog  Mini-Cog screen was not completed. Patient has no memory issues and does not feel the need to do this. Maximum score is 22. A value of 0 denotes this part of the MMSE was not completed or the patient failed this part of the Mini-Cog screening.  Normal cognitive status assessed by direct observation by this Nurse Health Advisor. No abnormalities found.         Immunizations Immunization History  Administered Date(s) Administered  . Fluad Quad(high Dose 65+) 09/28/2018  . Influenza Whole 01/13/2009  . Influenza, High Dose Seasonal PF 11/06/2016, 10/24/2017, 09/09/2019  . Influenza,inj,Quad PF,6+ Mos 10/07/2013, 12/21/2015  . PFIZER(Purple Top)SARS-COV-2 Vaccination 03/04/2019, 03/25/2019, 10/30/2019  . Pneumococcal Conjugate-13 07/14/2014, 06/20/2017  . Pneumococcal Polysaccharide-23 10/01/2008, 02/14/2016, 09/09/2019  . Td 01/08/1997, 04/16/2005  . Zoster 12/15/2013  . Zoster Recombinat (Shingrix) 10/06/2018, 12/06/2018    TDAP status:  Due, Education has been provided regarding the importance of this vaccine. Advised may receive this vaccine at local pharmacy or Health Dept. Aware to provide a copy of the vaccination record if obtained from local pharmacy or Health Dept. Verbalized acceptance and understanding.  Flu Vaccine status: Up to date  Pneumococcal vaccine status: Up to date  Covid-19 vaccine status: Completed vaccines  Qualifies for Shingles Vaccine? Yes   Zostavax completed Yes   Shingrix Completed: Yes per patient   Screening Tests Health Maintenance  Topic Date Due  . HEMOGLOBIN A1C  07/31/2019  . FOOT EXAM  01/13/2020  . TETANUS/TDAP  04/15/2025 (Originally 04/17/2015)  . COVID-19 Vaccine (4 - Booster for Pfizer series) 04/29/2020  . OPHTHALMOLOGY EXAM  06/16/2020  .  MAMMOGRAM  09/28/2020  . COLONOSCOPY (Pts 45-33yrs Insurance coverage will need to be confirmed)  01/23/2028  . INFLUENZA VACCINE  Completed  . DEXA SCAN  Completed  . Hepatitis C Screening  Completed  . PNA vac Low Risk Adult  Completed  . HPV VACCINES  Aged Out    Health Maintenance  Health Maintenance Due  Topic Date Due  . HEMOGLOBIN A1C  07/31/2019  . FOOT EXAM  01/13/2020    Colorectal cancer screening: Type of screening: Colonoscopy. Completed 01/22/2018. Repeat every 10 years  Mammogram status: Completed 09/29/2019. Repeat every year  Bone Density status: Completed 03/23/2016. Results reflect: Bone density results: NORMAL. Repeat every 2-5 years.  Lung Cancer Screening: (Low Dose CT Chest recommended if Age 105-80 years, 30 pack-year currently smoking OR have quit w/in 15 years.) does not qualify.   Additional Screening:  Hepatitis C Screening: does qualify; Completed 01/19/2016   Vision Screening: Recommended annual ophthalmology exams for early detection of glaucoma and other disorders of the eye. Is the patient up to date with their annual eye exam?  Yes  Who is the provider or what is the name of the office in which the patient attends annual eye exams? Saw Dr. Dustin Flock at last check up If pt is not established with a provider, would they like to be referred to a provider to establish care? No .   Dental Screening: Recommended annual dental exams for proper oral hygiene  Community Resource Referral / Chronic Care Management: CRR required this visit?  No   CCM required this visit?  No      Plan:     I have personally reviewed and noted the following in the patient's chart:   . Medical and social history . Use of alcohol, tobacco or illicit drugs  . Current medications and supplements . Functional ability and status . Nutritional status . Physical activity . Advanced directives . List of other physicians . Hospitalizations, surgeries, and ER visits in  previous 12 months . Vitals . Screenings to include cognitive, depression, and falls . Referrals and appointments  In addition, I have reviewed and discussed with patient certain preventive protocols, quality metrics, and best practice recommendations. A written personalized care plan for preventive services as well as general preventive health recommendations were provided to patient.   Due to this being a telephonic visit, the after visit summary with patients personalized plan was offered to patient via office or my-chart. Patient preferred to pick up at office at next visit or via mychart.   Andrez Grime, LPN   [id number]

## 2020-03-25 ENCOUNTER — Other Ambulatory Visit (INDEPENDENT_AMBULATORY_CARE_PROVIDER_SITE_OTHER): Payer: Medicare Other

## 2020-03-25 ENCOUNTER — Other Ambulatory Visit: Payer: Self-pay

## 2020-03-25 DIAGNOSIS — E119 Type 2 diabetes mellitus without complications: Secondary | ICD-10-CM | POA: Diagnosis not present

## 2020-03-25 DIAGNOSIS — E039 Hypothyroidism, unspecified: Secondary | ICD-10-CM | POA: Diagnosis not present

## 2020-03-25 DIAGNOSIS — I10 Essential (primary) hypertension: Secondary | ICD-10-CM | POA: Diagnosis not present

## 2020-03-25 DIAGNOSIS — E785 Hyperlipidemia, unspecified: Secondary | ICD-10-CM

## 2020-03-25 DIAGNOSIS — Z79899 Other long term (current) drug therapy: Secondary | ICD-10-CM

## 2020-03-25 DIAGNOSIS — E1169 Type 2 diabetes mellitus with other specified complication: Secondary | ICD-10-CM

## 2020-03-25 LAB — COMPREHENSIVE METABOLIC PANEL
ALT: 23 U/L (ref 0–35)
AST: 15 U/L (ref 0–37)
Albumin: 4.1 g/dL (ref 3.5–5.2)
Alkaline Phosphatase: 53 U/L (ref 39–117)
BUN: 15 mg/dL (ref 6–23)
CO2: 33 mEq/L — ABNORMAL HIGH (ref 19–32)
Calcium: 10 mg/dL (ref 8.4–10.5)
Chloride: 101 mEq/L (ref 96–112)
Creatinine, Ser: 0.77 mg/dL (ref 0.40–1.20)
GFR: 77.7 mL/min (ref >60.00)
Glucose, Bld: 174 mg/dL — ABNORMAL HIGH (ref 70–99)
Potassium: 4.5 mEq/L (ref 3.5–5.1)
Sodium: 142 mEq/L (ref 135–145)
Total Bilirubin: 0.5 mg/dL (ref 0.2–1.2)
Total Protein: 6.7 g/dL (ref 6.0–8.3)

## 2020-03-25 LAB — LIPID PANEL
Cholesterol: 167 mg/dL (ref 0–200)
HDL: 62.2 mg/dL (ref >39.00)
LDL Cholesterol: 88 mg/dL (ref 0–99)
NonHDL: 104.63
Total CHOL/HDL Ratio: 3
Triglycerides: 82 mg/dL (ref 0.0–149.0)
VLDL: 16.4 mg/dL (ref 0.0–40.0)

## 2020-03-25 LAB — CBC WITH DIFFERENTIAL/PLATELET
Basophils Absolute: 0 10*3/uL (ref 0.0–0.1)
Basophils Relative: 0.4 % (ref 0.0–3.0)
Eosinophils Absolute: 0.1 10*3/uL (ref 0.0–0.7)
Eosinophils Relative: 1.3 % (ref 0.0–5.0)
HCT: 39.6 % (ref 36.0–46.0)
Hemoglobin: 13.4 g/dL (ref 12.0–15.0)
Lymphocytes Relative: 39.6 % (ref 12.0–46.0)
Lymphs Abs: 2.1 10*3/uL (ref 0.7–4.0)
MCHC: 33.8 g/dL (ref 30.0–36.0)
MCV: 89.9 fl (ref 78.0–100.0)
Monocytes Absolute: 0.4 10*3/uL (ref 0.1–1.0)
Monocytes Relative: 7.9 % (ref 3.0–12.0)
Neutro Abs: 2.7 10*3/uL (ref 1.4–7.7)
Neutrophils Relative %: 50.8 % (ref 43.0–77.0)
Platelets: 271 10*3/uL (ref 150.0–400.0)
RBC: 4.41 Mil/uL (ref 3.87–5.11)
RDW: 13.7 % (ref 11.5–15.5)
WBC: 5.4 10*3/uL (ref 4.0–10.5)

## 2020-03-25 LAB — TSH: TSH: 1.32 u[IU]/mL (ref 0.35–4.50)

## 2020-03-25 LAB — VITAMIN B12: Vitamin B-12: 276 pg/mL (ref 211–911)

## 2020-03-25 LAB — HEMOGLOBIN A1C: Hgb A1c MFr Bld: 6.1 % (ref 4.6–6.5)

## 2020-04-02 ENCOUNTER — Ambulatory Visit: Payer: Medicare Other | Admitting: Family Medicine

## 2020-05-10 ENCOUNTER — Encounter: Payer: Self-pay | Admitting: Family Medicine

## 2020-05-10 ENCOUNTER — Other Ambulatory Visit: Payer: Self-pay

## 2020-05-10 ENCOUNTER — Ambulatory Visit (INDEPENDENT_AMBULATORY_CARE_PROVIDER_SITE_OTHER): Payer: Medicare Other | Admitting: Family Medicine

## 2020-05-10 VITALS — BP 124/80 | HR 70 | Temp 96.9°F | Ht 61.25 in | Wt 210.6 lb

## 2020-05-10 DIAGNOSIS — I10 Essential (primary) hypertension: Secondary | ICD-10-CM

## 2020-05-10 DIAGNOSIS — E119 Type 2 diabetes mellitus without complications: Secondary | ICD-10-CM

## 2020-05-10 DIAGNOSIS — E1169 Type 2 diabetes mellitus with other specified complication: Secondary | ICD-10-CM

## 2020-05-10 DIAGNOSIS — E039 Hypothyroidism, unspecified: Secondary | ICD-10-CM | POA: Diagnosis not present

## 2020-05-10 DIAGNOSIS — E785 Hyperlipidemia, unspecified: Secondary | ICD-10-CM | POA: Diagnosis not present

## 2020-05-10 DIAGNOSIS — F418 Other specified anxiety disorders: Secondary | ICD-10-CM

## 2020-05-10 DIAGNOSIS — I671 Cerebral aneurysm, nonruptured: Secondary | ICD-10-CM

## 2020-05-10 MED ORDER — BUPROPION HCL ER (XL) 300 MG PO TB24: 300.0000 mg | ORAL_TABLET | Freq: Every day | ORAL | 0 refills | Status: DC

## 2020-05-10 MED ORDER — ONETOUCH DELICA LANCETS 33G MISC: 1 refills | Status: AC

## 2020-05-10 MED ORDER — POTASSIUM CHLORIDE CRYS ER 10 MEQ PO TBCR: 10.0000 meq | EXTENDED_RELEASE_TABLET | Freq: Every day | ORAL | 3 refills | Status: DC

## 2020-05-10 MED ORDER — OMEPRAZOLE 20 MG PO CPDR: 20.0000 mg | DELAYED_RELEASE_CAPSULE | Freq: Every day | ORAL | 3 refills | Status: DC

## 2020-05-10 MED ORDER — PRAVASTATIN SODIUM 20 MG PO TABS: 20.0000 mg | ORAL_TABLET | Freq: Every day | ORAL | 3 refills | Status: DC

## 2020-05-10 MED ORDER — METFORMIN HCL 500 MG PO TABS: 500.0000 mg | ORAL_TABLET | Freq: Two times a day (BID) | ORAL | 3 refills | Status: DC

## 2020-05-10 MED ORDER — LEVOTHYROXINE SODIUM 137 MCG PO TABS: ORAL_TABLET | ORAL | 3 refills | Status: DC

## 2020-05-10 MED ORDER — FLUOXETINE HCL 40 MG PO CAPS: 80.0000 mg | ORAL_CAPSULE | Freq: Every day | ORAL | 3 refills | Status: DC

## 2020-05-10 MED ORDER — PROPRANOLOL HCL ER 120 MG PO CP24: 120.0000 mg | ORAL_CAPSULE | Freq: Every day | ORAL | 3 refills | Status: DC

## 2020-05-10 MED ORDER — AMLODIPINE BESYLATE 10 MG PO TABS: 1.0000 | ORAL_TABLET | Freq: Every day | ORAL | 3 refills | Status: DC

## 2020-05-10 MED ORDER — ONETOUCH ULTRA VI STRP: ORAL_STRIP | 1 refills | Status: AC

## 2020-05-10 MED ORDER — HYDROCHLOROTHIAZIDE 25 MG PO TABS: 1.0000 | ORAL_TABLET | Freq: Every day | ORAL | 3 refills | Status: DC

## 2020-05-10 MED ORDER — LOSARTAN POTASSIUM 100 MG PO TABS: 100.0000 mg | ORAL_TABLET | Freq: Every day | ORAL | 3 refills | Status: DC

## 2020-05-10 NOTE — Assessment & Plan Note (Signed)
Lab Results  Component Value Date   HGBA1C 6.1 03/25/2020   This is well controlled Continues metformin 500 mg bid Also arb and statin  Enc low glycemic diet and wt loss F/u 6 mo

## 2020-05-10 NOTE — Assessment & Plan Note (Signed)
Hypothyroidism  Pt has no clinical changes No change in energy level/ hair or skin/ edema and no tremor Lab Results  Component Value Date   TSH 1.32 03/25/2020

## 2020-05-10 NOTE — Assessment & Plan Note (Signed)
Pt had stressors in family -a rough year  Doing better recently  Continues prozac 80 mg daily  Reviewed stressors/ coping techniques/symptoms/ support sources/ tx options and side effects in detail today  Encouraged good self care

## 2020-05-10 NOTE — Progress Notes (Signed)
Subjective:    Patient ID: Andrea Cooper, female    DOB: [birth year], 70 y.o.   MRN: [id number]  This visit occurred during the SARS-CoV-2 public health emergency.  Safety protocols were in place, including screening questions prior to the visit, additional usage of staff PPE, and extensive cleaning of exam room while observing appropriate contact time as indicated for disinfecting solutions.    HPI Pt presents for annual f/u of chronic health problems   Wt Readings from Last 3 Encounters:  05/10/20 210 lb 9 oz (95.5 kg)  08/08/19 189 lb (85.7 kg)  04/25/19 206 lb (93.4 kg)   39.46 kg/m   Has had a really bad year   Last spring was doing better - diet/exercise  Then had a falling out with sister in august  Then she started loosing hair Then lost her BIL - unexpectedly (very tough)   Starting to do a little better   covid immunized   Has not started walking yet- wants to with the better weather   Colonoscopy 1/20   dexa 3/18 -normal    bp is stable today  No cp or palpitations or headaches or edema  No side effects to medicines  BP Readings from Last 3 Encounters:  05/10/20 124/80  08/08/19 124/70  04/25/19 130/80     Taking losartan 100 mg daily  Amlodipine 10 mg daily hctz 25 mg daily  Propranolol ER 120   Has f/u for aneurysm for her head tomorrow   Lab Results  Component Value Date   CREATININE 0.77 03/25/2020   BUN 15 03/25/2020   NA 142 03/25/2020   K 4.5 03/25/2020   CL 101 03/25/2020   CO2 33 (H) 03/25/2020   Lab Results  Component Value Date   ALT 23 03/25/2020   AST 15 03/25/2020   ALKPHOS 53 03/25/2020   BILITOT 0.5 03/25/2020      Had amw on 3/8 No gaps   DM2 Lab Results  Component Value Date   HGBA1C 6.1 03/25/2020   Fairly stable  Metformin 500 mg bid   Hyperlipidemia Lab Results  Component Value Date   CHOL 167 03/25/2020   CHOL 177 01/31/2019   CHOL 198 07/09/2018   Lab Results  Component Value Date   HDL 62.20  03/25/2020   HDL 57.90 01/31/2019   HDL 58.00 07/09/2018   Lab Results  Component Value Date   LDLCALC 88 03/25/2020   LDLCALC 96 01/31/2019   LDLCALC 111 (H) 07/09/2018   Lab Results  Component Value Date   TRIG 82.0 03/25/2020   TRIG 116.0 01/31/2019   TRIG 141.0 07/09/2018   Lab Results  Component Value Date   CHOLHDL 3 03/25/2020   CHOLHDL 3 01/31/2019   CHOLHDL 3 07/09/2018   Lab Results  Component Value Date   LDLDIRECT 160.7 01/14/2007   LDLDIRECT 202.7 08/01/2006   Pravastatin 20 mg daily   Hypothyroidism  Pt has no clinical changes No change in energy level/ hair or skin/ edema and no tremor Lab Results  Component Value Date   TSH 1.32 03/25/2020     Taking levothyroxine 137 mcg daily   Patient Active Problem List   Diagnosis Date Noted  . Current use of proton pump inhibitor 03/23/2020  . Allergic dermatitis 01/07/2018  . Dysphagia 01/26/2017  . Hearing loss 02/14/2016  . Estrogen deficiency 02/14/2016  . GERD (gastroesophageal reflux disease) 08/13/2015  . Visit for screening mammogram 07/25/2014  . Encounter for Medicare annual wellness  exam 07/14/2014  . Hypokalemia 12/15/2013  . Other screening mammogram 06/07/2011  . SLEEP APNEA 09/13/2009  . Class 2 obesity due to excess calories with body mass index (BMI) of 39.0 to 39.9 in adult 01/13/2009  . POSTMENOPAUSAL STATUS 10/01/2008  . BACK PAIN, LUMBAR, WITH RADICULOPATHY 08/10/2008  . SWEATING 07/15/2008  . Diabetes type 2, controlled ([place]) 05/22/2007  . TOBACCO USE, QUIT 05/22/2007  . CEREBRAL ANEURYSM 08/09/2006  . Hypothyroidism 07/25/2006  . Hyperlipidemia associated with type 2 diabetes mellitus ([place]) 07/25/2006  . ANXIETY 07/25/2006  . Depression with anxiety 07/25/2006  . Essential hypertension 07/25/2006  . IBS 07/25/2006  . OVEREATING 07/25/2006  . SKIN CANCER, HX OF 07/25/2006   Past Medical History:  Diagnosis Date  . Anxiety   . Cataract 2019   corrected with surgery  .  Depression   . Diabetes mellitus without complication ([place])   . GERD (gastroesophageal reflux disease)   . History of anxiety   . History of cerebral aneurysm repair    history of cerebral aneurysm   . History of depression   . History of hyperglycemia   . History of hyperlipidemia   . History of hypothyroidism   . History of melanoma   . History of motion sickness   . History of skin cancer    hx of melanoma left lower arm, recurrance  . Hypercholesterolemia   . Hypertension   . Hypothyroidism   . IBS (irritable bowel syndrome)   . Nocturnal oxygen desaturation   . PONV (postoperative nausea and vomiting)   . Thyroid disease    Past Surgical History:  Procedure Laterality Date  . ABDOMINAL HYSTERECTOMY    . APPENDECTOMY  1966  . BREAST BIOPSY Left 07-29-14    core bx FIBROCYSTIC CHANGES WITH FEW MICROCALCIFICATIONS.  Marland Kitchen cardiolite  12/1997   normal EF 68%  . CATARACT EXTRACTION W/ INTRAOCULAR LENS  IMPLANT, BILATERAL Bilateral 04/2017   [facility]  . CEREBRAL ANEURYSM REPAIR  2008, 2009   coiling at [facility]  . COLONOSCOPY WITH PROPOFOL N/A 01/22/2018   Procedure: COLONOSCOPY WITH PROPOFOL;  Surgeon: Toledo, Benay Pike, MD;  Location: [facility] ENDOSCOPY;  Service: Gastroenterology;  Laterality: N/A;  . EYE SURGERY     RR  . FOOT SURGERY  02/1998   bilateral  . INTRAOPERATIVE ARTERIOGRAM  10/17/2015  . [place]  . TOE SURGERY Left 2015  . TONSILLECTOMY  1964  . TOTAL ABDOMINAL HYSTERECTOMY W/ BILATERAL SALPINGOOPHORECTOMY     Social History   Tobacco Use  . Smoking status: Former Research scientist (life sciences)  . Smokeless tobacco: Never Used  Vaping Use  . Vaping Use: Never used  Substance Use Topics  . Alcohol use: Yes    Alcohol/week: 1.0 standard drink    Types: 1 Standard drinks or equivalent per week    Comment: occasional wine  . Drug use: No   Family History  Problem Relation Age of Onset  . Diabetes Father   . Depression Father   . CAD Father   . Diabetes Mother    . Diverticulosis Mother   . Adrenal disorder Mother   . Breast cancer Other        distant relatives (all on mother's side)  . Cerebral aneurysm Sister    Allergies  Allergen Reactions  . Codeine Nausea And Vomiting    REACTION: nausea and vomiting  . Lisinopril     Cough/throat symptoms   . Latex Rash   Current Outpatient Medications on  File Prior to Visit  Medication Sig Dispense Refill  . aspirin EC 81 MG tablet Take 81 mg by mouth daily.    . calcium carbonate (OSCAL) 1500 (600 Ca) MG TABS tablet Take by mouth 2 (two) times daily with a meal.    . hyoscyamine (LEVSIN SL) 0.125 MG SL tablet Place 0.125 mg under the tongue every 4 (four) hours as needed.     No current facility-administered medications on file prior to visit.     Review of Systems  Constitutional: Positive for fatigue. Negative for activity change, appetite change, fever and unexpected weight change.  HENT: Negative for congestion, ear pain, rhinorrhea, sinus pressure and sore throat.   Eyes: Negative for pain, redness and visual disturbance.  Respiratory: Negative for cough, shortness of breath and wheezing.   Cardiovascular: Negative for chest pain and palpitations.  Gastrointestinal: Negative for abdominal pain, blood in stool, constipation and diarrhea.  Endocrine: Negative for polydipsia and polyuria.  Genitourinary: Negative for dysuria, frequency and urgency.  Musculoskeletal: Negative for arthralgias, back pain and myalgias.  Skin: Negative for pallor and rash.  Allergic/Immunologic: Negative for environmental allergies.  Neurological: Negative for dizziness, syncope and headaches.  Hematological: Negative for adenopathy. Does not bruise/bleed easily.  Psychiatric/Behavioral: Positive for dysphoric mood. Negative for decreased concentration. The patient is nervous/anxious.        Stressors       Objective:   Physical Exam Constitutional:      General: She is not in acute distress.     Appearance: Normal appearance. She is well-developed. She is obese. She is not ill-appearing.  HENT:     Head: Normocephalic and atraumatic.  Eyes:     Conjunctiva/sclera: Conjunctivae normal.     Pupils: Pupils are equal, round, and reactive to light.  Neck:     Thyroid: No thyromegaly.     Vascular: No carotid bruit or JVD.  Cardiovascular:     Rate and Rhythm: Normal rate and regular rhythm.     Heart sounds: Normal heart sounds. No gallop.   Pulmonary:     Effort: Pulmonary effort is normal. No respiratory distress.     Breath sounds: Normal breath sounds. No wheezing or rales.  Abdominal:     General: Bowel sounds are normal. There is no distension or abdominal bruit.     Palpations: Abdomen is soft. There is no mass.     Tenderness: There is no abdominal tenderness.  Musculoskeletal:     Cervical back: Normal range of motion and neck supple.  Lymphadenopathy:     Cervical: No cervical adenopathy.  Skin:    General: Skin is warm and dry.     Coloration: Skin is not pale.     Findings: No erythema or rash.  Neurological:     Mental Status: She is alert.     Sensory: No sensory deficit.     Coordination: Coordination normal.     Deep Tendon Reflexes: Reflexes are normal and symmetric. Reflexes normal.  Psychiatric:        Mood and Affect: Mood is depressed.        Cognition and Memory: Cognition and memory normal.     Comments: Mildly tearful when discussing stressors  Candidly discusses situation            Assessment & Plan:   Problem List Items Addressed This Visit      Cardiovascular and Mediastinum   Essential hypertension - Primary    bp in fair control at  this time  BP Readings from Last 1 Encounters:  05/10/20 124/80   No changes needed Most recent labs reviewed  Disc lifstyle change with low sodium diet and exercise  Plan to continue losartan 100 mg daily  Amlodipine 10 mg daily  hctz 25 mg daily  Propranolol ER 120 mg daily  Cardiology has  signed off      Relevant Medications   losartan (COZAAR) 100 MG tablet   amLODipine (NORVASC) 10 MG tablet   hydrochlorothiazide (HYDRODIURIL) 25 MG tablet   pravastatin (PRAVACHOL) 20 MG tablet   propranolol ER (INDERAL LA) 120 MG 24 hr capsule   CEREBRAL ANEURYSM    Doing well  F/u planned soon with MRI      Relevant Medications   losartan (COZAAR) 100 MG tablet   amLODipine (NORVASC) 10 MG tablet   hydrochlorothiazide (HYDRODIURIL) 25 MG tablet   pravastatin (PRAVACHOL) 20 MG tablet   propranolol ER (INDERAL LA) 120 MG 24 hr capsule     Endocrine   Hypothyroidism    Hypothyroidism  Pt has no clinical changes No change in energy level/ hair or skin/ edema and no tremor Lab Results  Component Value Date   TSH 1.32 03/25/2020          Relevant Medications   levothyroxine (SYNTHROID) 137 MCG tablet   propranolol ER (INDERAL LA) 120 MG 24 hr capsule   Hyperlipidemia associated with type 2 diabetes mellitus (HCC)    Stable with diet and pravastatin 20 mg daily  Disc goals for lipids and reasons to control them Rev last labs with pt Rev low sat fat diet in detail       Relevant Medications   losartan (COZAAR) 100 MG tablet   metFORMIN (GLUCOPHAGE) 500 MG tablet   pravastatin (PRAVACHOL) 20 MG tablet   Diabetes type 2, controlled (HCC)    Lab Results  Component Value Date   HGBA1C 6.1 03/25/2020   This is well controlled Continues metformin 500 mg bid Also arb and statin  Enc low glycemic diet and wt loss F/u 6 mo      Relevant Medications   losartan (COZAAR) 100 MG tablet   metFORMIN (GLUCOPHAGE) 500 MG tablet   pravastatin (PRAVACHOL) 20 MG tablet     Other   Depression with anxiety    Pt had stressors in family -a rough year  Doing better recently  Continues prozac 80 mg daily  Reviewed stressors/ coping techniques/symptoms/ support sources/ tx options and side effects in detail today  Encouraged good self care       Relevant Medications    buPROPion (WELLBUTRIN XL) 300 MG 24 hr tablet   FLUoxetine (PROZAC) 40 MG capsule

## 2020-05-10 NOTE — Assessment & Plan Note (Signed)
Stable with diet and pravastatin 20 mg daily  Disc goals for lipids and reasons to control them Rev last labs with pt Rev low sat fat diet in detail

## 2020-05-10 NOTE — Assessment & Plan Note (Signed)
bp in fair control at this time  BP Readings from Last 1 Encounters:  05/10/20 124/80   No changes needed Most recent labs reviewed  Disc lifstyle change with low sodium diet and exercise  Plan to continue losartan 100 mg daily  Amlodipine 10 mg daily  hctz 25 mg daily  Propranolol ER 120 mg daily  Cardiology has signed off

## 2020-05-10 NOTE — Patient Instructions (Signed)
Take care of yourself   Labs are stable   Get back to walking when you can

## 2020-05-10 NOTE — Assessment & Plan Note (Signed)
Doing well  F/u planned soon with MRI

## 2020-05-11 DIAGNOSIS — Z8679 Personal history of other diseases of the circulatory system: Secondary | ICD-10-CM | POA: Diagnosis not present

## 2020-05-11 DIAGNOSIS — Z8249 Family history of ischemic heart disease and other diseases of the circulatory system: Secondary | ICD-10-CM | POA: Diagnosis not present

## 2020-05-11 DIAGNOSIS — Z9889 Other specified postprocedural states: Secondary | ICD-10-CM | POA: Diagnosis not present

## 2020-05-11 DIAGNOSIS — I671 Cerebral aneurysm, nonruptured: Secondary | ICD-10-CM | POA: Diagnosis not present

## 2020-05-17 DIAGNOSIS — Z85828 Personal history of other malignant neoplasm of skin: Secondary | ICD-10-CM | POA: Diagnosis not present

## 2020-05-26 ENCOUNTER — Telehealth: Payer: Self-pay | Admitting: *Deleted

## 2020-05-26 NOTE — Telephone Encounter (Signed)
Received fax from [facility] asking PCP to send in new Rx for "calcium carbonate tabs 600 mg"  Asking for 90 day supply. This dose isn't on med list and it doesn't look like PCP has filled calcium Rx before so will route to PCP for review  CPE was 05/10/20 and f/u scheduled for 11/09/20

## 2020-05-27 MED ORDER — VITAMIN D3 50 MCG (2000 UT) PO CAPS: 2000.0000 [IU] | ORAL_CAPSULE | Freq: Every day | ORAL | 3 refills | Status: AC

## 2020-05-27 MED ORDER — CALCIUM CARBONATE 600 MG PO TABS: 600.0000 mg | ORAL_TABLET | Freq: Two times a day (BID) | ORAL | 3 refills | Status: AC

## 2020-05-27 NOTE — Telephone Encounter (Signed)
Its fine to send bid #[phone] with 3 refills  If it does not have vitamin D in it then she needs to take separate vit D 2000 iu daily

## 2020-05-28 ENCOUNTER — Telehealth: Payer: Self-pay

## 2020-05-28 NOTE — Telephone Encounter (Signed)
Aware- agree with advisement  I will see him then

## 2020-05-28 NOTE — Telephone Encounter (Signed)
Patient called and stated for the past couple of weeks she has noticed that she has numbness and tingling in her feet, more on the L side. Patient also reports swelling in her L ankle. Patient denied SOB, pain, injury to the area, or other acute symptoms. Patient stated that it helps to elevate her feet, so that's what she has been doing. Patient scheduled with Dr. Glori Bickers on Wednesday (5/18) at 0800. UC and ED precautions given. Patient verbalized understanding.

## 2020-06-02 ENCOUNTER — Ambulatory Visit
Admission: RE | Admit: 2020-06-02 | Discharge: 2020-06-02 | Disposition: A | Discharge status: Home / Self Care | Payer: Medicare Other | Source: Ambulatory Visit | Attending: Family Medicine | Admitting: Family Medicine

## 2020-06-02 ENCOUNTER — Encounter: Payer: Self-pay | Admitting: Family Medicine

## 2020-06-02 ENCOUNTER — Telehealth: Payer: Self-pay

## 2020-06-02 ENCOUNTER — Ambulatory Visit (INDEPENDENT_AMBULATORY_CARE_PROVIDER_SITE_OTHER): Payer: Medicare Other | Admitting: Family Medicine

## 2020-06-02 ENCOUNTER — Other Ambulatory Visit: Payer: Self-pay

## 2020-06-02 VITALS — BP 122/70 | HR 69 | Temp 97.0°F | Ht 61.25 in | Wt 209.6 lb

## 2020-06-02 DIAGNOSIS — E119 Type 2 diabetes mellitus without complications: Secondary | ICD-10-CM

## 2020-06-02 DIAGNOSIS — R202 Paresthesia of skin: Secondary | ICD-10-CM | POA: Insufficient documentation

## 2020-06-02 DIAGNOSIS — M79662 Pain in left lower leg: Secondary | ICD-10-CM | POA: Insufficient documentation

## 2020-06-02 DIAGNOSIS — M7989 Other specified soft tissue disorders: Secondary | ICD-10-CM | POA: Insufficient documentation

## 2020-06-02 NOTE — Assessment & Plan Note (Signed)
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

## 2020-06-02 NOTE — Patient Instructions (Addendum)
I want to get an ultrasound of your leg  We will get that ordered   I suspect more likely an injury that has started to improve Elevate /warm compress if needed   For the tingling- think about working on your diabetes  Better foot ware Diet and exercise  Let us know if your blood glucose does not improve   Also get a B12 supplement over the counter and take 500 mcg daily   Try to eat a balanced diet

## 2020-06-02 NOTE — Assessment & Plan Note (Addendum)
Lab Results  Component Value Date   HGBA1C 6.1 03/25/2020   Per pt more emotional eating and glucose is generally up  Tingling in feet may be related Ready to get back on track  Continues metformin 500 mg bid disc imp of low glycemic diet and wt loss to control DM2

## 2020-06-02 NOTE — Telephone Encounter (Signed)
Please let her know no blood clot (good)  Keep me posted re: symptoms

## 2020-06-02 NOTE — Telephone Encounter (Signed)
Abby with [place] called report for US venous lt lower leg; report is neg for DVT and pt is not waiting.  Report taken to Dr Glori Bickers and report is also in [place].

## 2020-06-02 NOTE — Telephone Encounter (Signed)
Pt notified of US results and Dr. Tower's comments  

## 2020-06-02 NOTE — Progress Notes (Signed)
Subjective:    Patient ID: Andrea Cooper, female    DOB: [birth year], 70 y.o.   MRN: [id number]  This visit occurred during the SARS-CoV-2 public health emergency.  Safety protocols were in place, including screening questions prior to the visit, additional usage of staff PPE, and extensive cleaning of exam room while observing appropriate contact time as indicated for disinfecting solutions.    HPI Pt presents with c/o numbness and tingling in feet   Wt Readings from Last 3 Encounters:  06/02/20 209 lb 9 oz (95.1 kg)  05/10/20 210 lb 9 oz (95.5 kg)  08/08/19 189 lb (85.7 kg)   39.27 kg/m  Early may-tingling in feet Then swelling in L ankle  That went up and down  Got a knot in her lower leg that was sore (not tender but it throbbed and hurt)  Yesterday woke up and it was gone   No trauma that she knew of    Tingling still happens/ more in L than right   As a tender spot on bottom of L foot   No varicose veins  No cp or sob    DM2 Lab Results  Component Value Date   HGBA1C 6.1 03/25/2020   Fairly good control  Glucose is higher lately 140s-170s  Diet is not as good as it was in march  Emotional eating   Some more thirst latley   She had started walking (new park near her house)  Has not walked in 3-4 months    Hypothyroid Lab Results  Component Value Date   TSH 1.32 03/25/2020    Lab Results  Component Value Date   VITAMINB12 276 03/25/2020   No B vit supplements  BP Readings from Last 3 Encounters:  06/02/20 122/70  05/10/20 124/80  08/08/19 124/70   Pulse Readings from Last 3 Encounters:  06/02/20 69  05/10/20 70  08/08/19 65   Patient Active Problem List   Diagnosis Date Noted  . Pain and swelling of left lower leg 06/02/2020  . Tingling of both feet 06/02/2020  . Current use of proton pump inhibitor 03/23/2020  . Allergic dermatitis 01/07/2018  . Dysphagia 01/26/2017  . Hearing loss 02/14/2016  . Estrogen deficiency 02/14/2016  .  GERD (gastroesophageal reflux disease) 08/13/2015  . Visit for screening mammogram 07/25/2014  . Encounter for Medicare annual wellness exam 07/14/2014  . Hypokalemia 12/15/2013  . Other screening mammogram 06/07/2011  . SLEEP APNEA 09/13/2009  . Class 2 obesity due to excess calories with body mass index (BMI) of 39.0 to 39.9 in adult 01/13/2009  . POSTMENOPAUSAL STATUS 10/01/2008  . BACK PAIN, LUMBAR, WITH RADICULOPATHY 08/10/2008  . SWEATING 07/15/2008  . Diabetes type 2, controlled ([place]) 05/22/2007  . TOBACCO USE, QUIT 05/22/2007  . CEREBRAL ANEURYSM 08/09/2006  . Hypothyroidism 07/25/2006  . Hyperlipidemia associated with type 2 diabetes mellitus ([place]) 07/25/2006  . ANXIETY 07/25/2006  . Depression with anxiety 07/25/2006  . Essential hypertension 07/25/2006  . IBS 07/25/2006  . OVEREATING 07/25/2006  . SKIN CANCER, HX OF 07/25/2006   Past Medical History:  Diagnosis Date  . Anxiety   . Cataract 2019   corrected with surgery  . Depression   . Diabetes mellitus without complication ([place])   . GERD (gastroesophageal reflux disease)   . History of anxiety   . History of cerebral aneurysm repair    history of cerebral aneurysm   . History of depression   . History of hyperglycemia   . History  of hyperlipidemia   . History of hypothyroidism   . History of melanoma   . History of motion sickness   . History of skin cancer    hx of melanoma left lower arm, recurrance  . Hypercholesterolemia   . Hypertension   . Hypothyroidism   . IBS (irritable bowel syndrome)   . Nocturnal oxygen desaturation   . PONV (postoperative nausea and vomiting)   . Thyroid disease    Past Surgical History:  Procedure Laterality Date  . ABDOMINAL HYSTERECTOMY    . APPENDECTOMY  1966  . BREAST BIOPSY Left 07-29-14    core bx FIBROCYSTIC CHANGES WITH FEW MICROCALCIFICATIONS.  Marland Kitchen cardiolite  12/1997   normal EF 68%  . CATARACT EXTRACTION W/ INTRAOCULAR LENS  IMPLANT, BILATERAL Bilateral  04/2017   [facility]  . CEREBRAL ANEURYSM REPAIR  2008, 2009   coiling at [facility]  . COLONOSCOPY WITH PROPOFOL N/A 01/22/2018   Procedure: COLONOSCOPY WITH PROPOFOL;  Surgeon: Toledo, Benay Pike, MD;  Location: [facility] ENDOSCOPY;  Service: Gastroenterology;  Laterality: N/A;  . EYE SURGERY     RR  . FOOT SURGERY  02/1998   bilateral  . INTRAOPERATIVE ARTERIOGRAM  10/17/2015  . [place]  . TOE SURGERY Left 2015  . TONSILLECTOMY  1964  . TOTAL ABDOMINAL HYSTERECTOMY W/ BILATERAL SALPINGOOPHORECTOMY     Social History   Tobacco Use  . Smoking status: Former Research scientist (life sciences)  . Smokeless tobacco: Never Used  Vaping Use  . Vaping Use: Never used  Substance Use Topics  . Alcohol use: Yes    Alcohol/week: 1.0 standard drink    Types: 1 Standard drinks or equivalent per week    Comment: occasional wine  . Drug use: No   Family History  Problem Relation Age of Onset  . Diabetes Father   . Depression Father   . CAD Father   . Diabetes Mother   . Diverticulosis Mother   . Adrenal disorder Mother   . Breast cancer Other        distant relatives (all on mother's side)  . Cerebral aneurysm Sister    Allergies  Allergen Reactions  . Codeine Nausea And Vomiting    REACTION: nausea and vomiting  . Lisinopril     Cough/throat symptoms   . Latex Rash   Current Outpatient Medications on File Prior to Visit  Medication Sig Dispense Refill  . amLODipine (NORVASC) 10 MG tablet Take 1 tablet (10 mg total) by mouth daily. 90 tablet 3  . aspirin EC 81 MG tablet Take 81 mg by mouth daily.    Marland Kitchen buPROPion (WELLBUTRIN XL) 300 MG 24 hr tablet Take 1 tablet (300 mg total) by mouth daily. NEEDS OFFICE VISIT 90 tablet 0  . calcium carbonate (CALCIUM 600) 600 MG TABS tablet Take 1 tablet (600 mg total) by mouth 2 (two) times daily. 180 tablet 3  . Cholecalciferol (VITAMIN D3) 50 MCG (2000 UT) capsule Take 1 capsule (2,000 Units total) by mouth daily. 90 capsule 3  . FLUoxetine (PROZAC) 40 MG  capsule Take 2 capsules (80 mg total) by mouth daily. 180 capsule 3  . glucose blood (ONETOUCH ULTRA) test strip USE 1 STRIP TO CHECK GLUCOSE ONCE DAILYH AS DIRECTED (daignosis code: E11.9) 100 each 1  . hydrochlorothiazide (HYDRODIURIL) 25 MG tablet Take 1 tablet (25 mg total) by mouth daily. 90 tablet 3  . hyoscyamine (LEVSIN SL) 0.125 MG SL tablet Place 0.125 mg under the tongue every 4 (four)  hours as needed.    Marland Kitchen levothyroxine (SYNTHROID) 137 MCG tablet TAKE 1 TABLET DAILY BEFORE BREAKFAST 90 tablet 3  . losartan (COZAAR) 100 MG tablet Take 1 tablet (100 mg total) by mouth daily. 90 tablet 3  . metFORMIN (GLUCOPHAGE) 500 MG tablet Take 1 tablet (500 mg total) by mouth 2 (two) times daily with a meal. 180 tablet 3  . omeprazole (PRILOSEC) 20 MG capsule Take 1 capsule (20 mg total) by mouth daily. 90 capsule 3  . OneTouch Delica Lancets 93Y MISC Check blood sugar twice a day and as directed. Dx E11.9 200 each 1  . potassium chloride (KLOR-CON) 10 MEQ tablet Take 1 tablet (10 mEq total) by mouth daily. 90 tablet 3  . pravastatin (PRAVACHOL) 20 MG tablet Take 1 tablet (20 mg total) by mouth daily. 90 tablet 3  . propranolol ER (INDERAL LA) 120 MG 24 hr capsule Take 1 capsule (120 mg total) by mouth daily. 90 capsule 3   No current facility-administered medications on file prior to visit.    Review of Systems  Constitutional: Negative for activity change, appetite change, fatigue, fever and unexpected weight change.  HENT: Negative for congestion, ear pain, rhinorrhea, sinus pressure and sore throat.   Eyes: Negative for pain, redness and visual disturbance.  Respiratory: Negative for cough, shortness of breath and wheezing.   Cardiovascular: Negative for chest pain and palpitations.  Gastrointestinal: Negative for abdominal pain, blood in stool, constipation and diarrhea.  Endocrine: Negative for polydipsia and polyuria.  Genitourinary: Negative for dysuria, frequency and urgency.   Musculoskeletal: Negative for arthralgias, back pain and myalgias.  Skin: Negative for pallor and rash.  Allergic/Immunologic: Negative for environmental allergies.  Neurological: Positive for numbness. Negative for dizziness, tremors, syncope, weakness, light-headedness and headaches.       Tingling in both feet No pain or burning   Hematological: Negative for adenopathy. Does not bruise/bleed easily.  Psychiatric/Behavioral: Negative for decreased concentration and dysphoric mood. The patient is not nervous/anxious.        Objective:   Physical Exam Constitutional:      General: She is not in acute distress.    Appearance: Normal appearance. She is well-developed. She is obese. She is not ill-appearing or diaphoretic.  HENT:     Head: Normocephalic and atraumatic.  Eyes:     General: No scleral icterus.    Conjunctiva/sclera: Conjunctivae normal.     Pupils: Pupils are equal, round, and reactive to light.  Neck:     Thyroid: No thyromegaly.     Vascular: No carotid bruit or JVD.  Cardiovascular:     Rate and Rhythm: Normal rate and regular rhythm.     Pulses: Normal pulses.     Heart sounds: Normal heart sounds. No gallop.      Comments: Strong pedal pulses Pulmonary:     Effort: Pulmonary effort is normal. No respiratory distress.     Breath sounds: Normal breath sounds. No wheezing or rales.  Abdominal:     General: Bowel sounds are normal. There is no distension or abdominal bruit.     Palpations: Abdomen is soft. There is no mass.     Tenderness: There is no abdominal tenderness.  Musculoskeletal:        General: Tenderness present.     Cervical back: Normal range of motion and neck supple. No tenderness.     Right lower leg: No edema.     Left lower leg: No edema.     Comments:  L calf is mildly tender  No palpable cord or mass Neg homan's sign  No erythema or warmth No varicosities   Lymphadenopathy:     Cervical: No cervical adenopathy.  Skin:    General:  Skin is warm and dry.     Coloration: Skin is not pale.     Findings: No erythema or rash.  Neurological:     Mental Status: She is alert.     Coordination: Coordination normal.     Deep Tendon Reflexes: Reflexes are normal and symmetric. Reflexes normal.     Comments: Sensation in feet is nl to soft touch and temperature    Psychiatric:        Mood and Affect: Mood normal.           Assessment & Plan:   Problem List Items Addressed This Visit      Endocrine   Diabetes type 2, controlled ([place])    Lab Results  Component Value Date   HGBA1C 6.1 03/25/2020   Per pt more emotional eating and glucose is generally up  Tingling in feet may be related Ready to get back on track  Continues metformin 500 mg bid disc imp of low glycemic diet and wt loss to control DM2          Other   Pain and swelling of left lower leg - Primary    Some calf tenderness Swelling has resolved and palpable knot is almost gone  Venous doppler ordered to r/o DVT Enc elevation and gentle heat if needed        Relevant Orders   US Venous Img Lower Unilateral Left (DVT) (Completed)   Tingling of both feet    In diabetic pt with recently higher glucose readings -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

## 2020-06-02 NOTE — Assessment & Plan Note (Signed)
Some calf tenderness Swelling has resolved and palpable knot is almost gone  Venous doppler ordered to r/o DVT Enc elevation and gentle heat if needed

## 2020-06-22 DIAGNOSIS — Z85820 Personal history of malignant melanoma of skin: Secondary | ICD-10-CM | POA: Diagnosis not present

## 2020-06-22 DIAGNOSIS — D2262 Melanocytic nevi of left upper limb, including shoulder: Secondary | ICD-10-CM | POA: Diagnosis not present

## 2020-06-22 DIAGNOSIS — Z85828 Personal history of other malignant neoplasm of skin: Secondary | ICD-10-CM | POA: Diagnosis not present

## 2020-06-22 DIAGNOSIS — D2261 Melanocytic nevi of right upper limb, including shoulder: Secondary | ICD-10-CM | POA: Diagnosis not present

## 2020-06-22 DIAGNOSIS — D2272 Melanocytic nevi of left lower limb, including hip: Secondary | ICD-10-CM | POA: Diagnosis not present

## 2020-08-09 DIAGNOSIS — Z961 Presence of intraocular lens: Secondary | ICD-10-CM | POA: Diagnosis not present

## 2020-08-09 DIAGNOSIS — E119 Type 2 diabetes mellitus without complications: Secondary | ICD-10-CM | POA: Diagnosis not present

## 2020-08-09 LAB — HM DIABETES EYE EXAM

## 2020-08-13 ENCOUNTER — Other Ambulatory Visit: Payer: Self-pay | Admitting: Family Medicine

## 2020-10-24 IMAGING — CT CT MAXILLOFACIAL W/O CM
4 of 6 series · 17 of 47 positions shown, 19 images · non-contrast
Comparison: CT head 08/05/2006

CLINICAL DATA: Facial trauma. Fall yesterday. Right periorbital
hematoma. Headache.

EXAM:
CT HEAD WITHOUT CONTRAST
CT MAXILLOFACIAL WITHOUT CONTRAST
TECHNIQUE: Multidetector CT imaging of the head and maxillofacial structures
were performed using the standard protocol without intravenous
contrast. Multiplanar CT image reconstructions of the maxillofacial
structures were also generated.

[Series 2: head (person_name) (person_name) · axial · 0.47mm/px · z∈[-77,+33]mm · 7 of 30 slices shown, 9 images]
[im 4/30  brain]
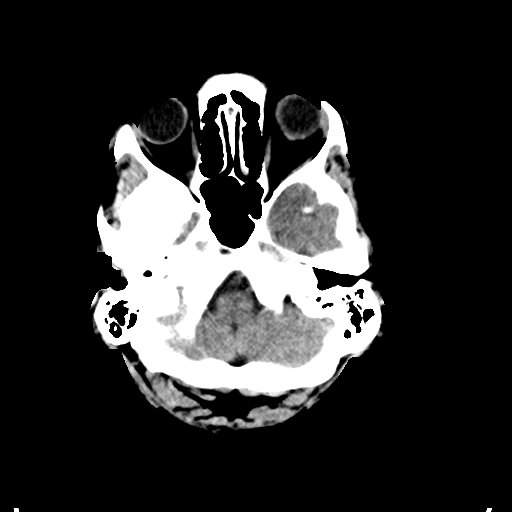
[im 4/30  bone]
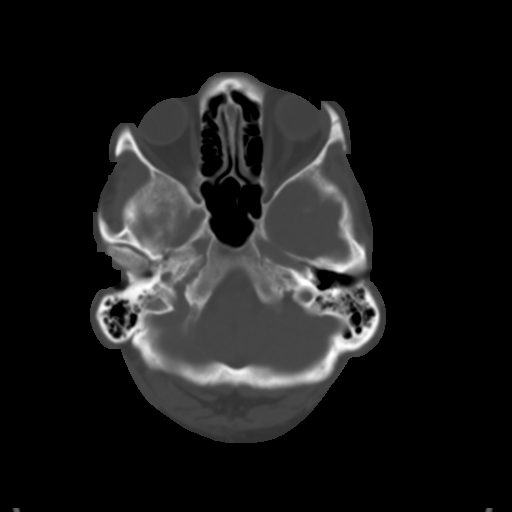
[im 8/30  bone]
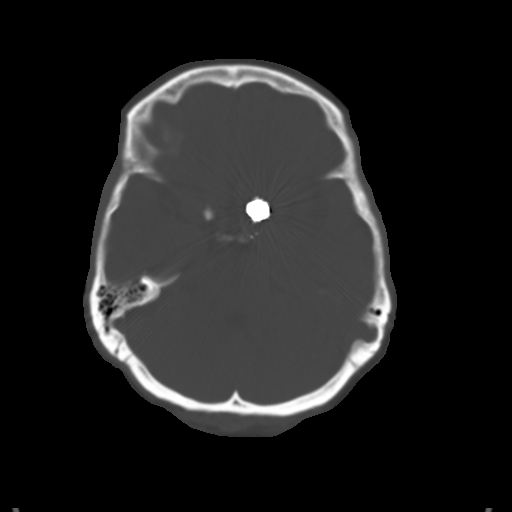
[im 11/30  bone]
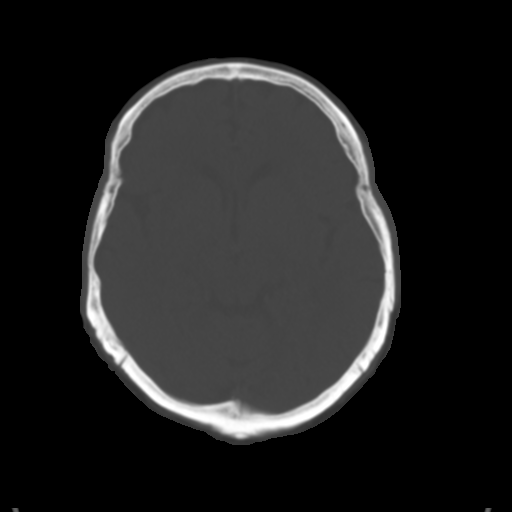
[im 15/30  bone]
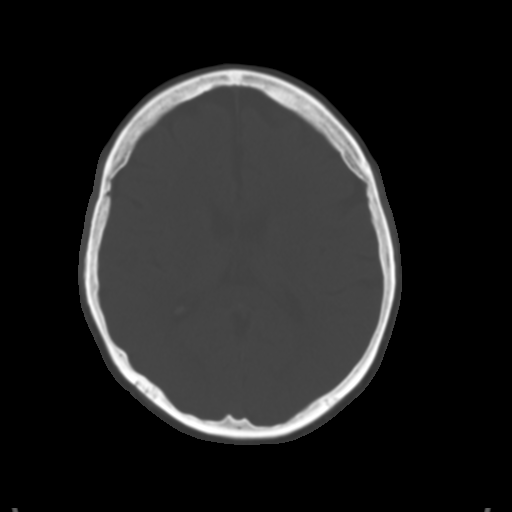
[im 19/30  brain]
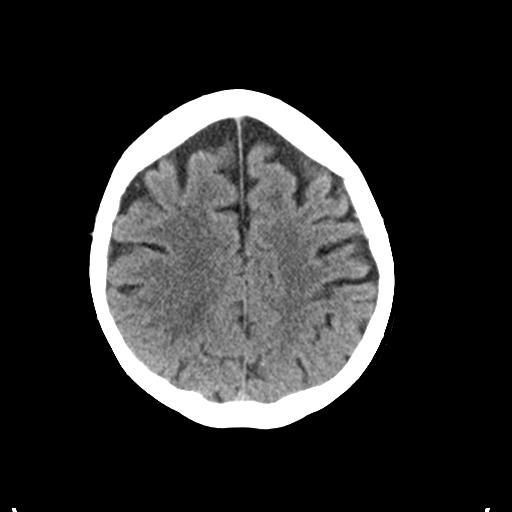
[im 19/30  bone]
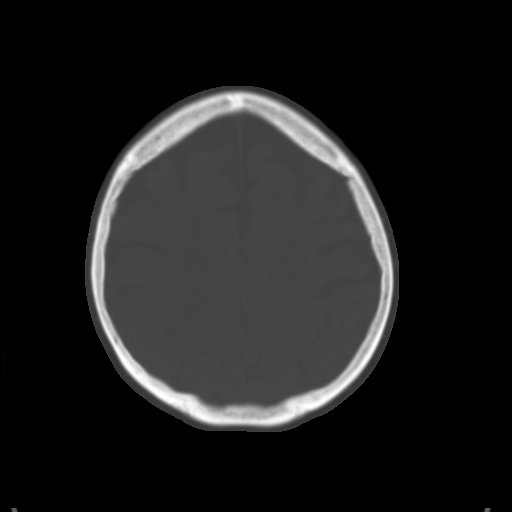
[im 22/30  bone]
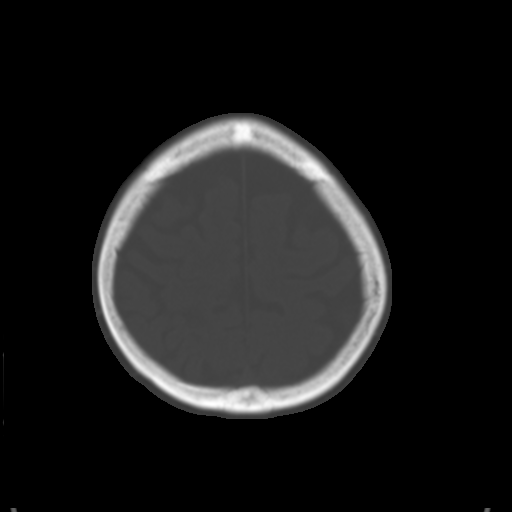
[im 26/30  bone]
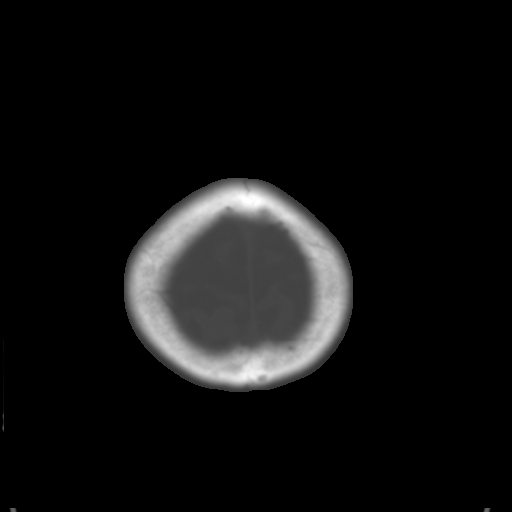

[Series 6: max soft (person_name) · axial · 0.33mm/px · z∈[-184,-120]mm · 5 of 72 slices shown]
[im 8/72  brain]
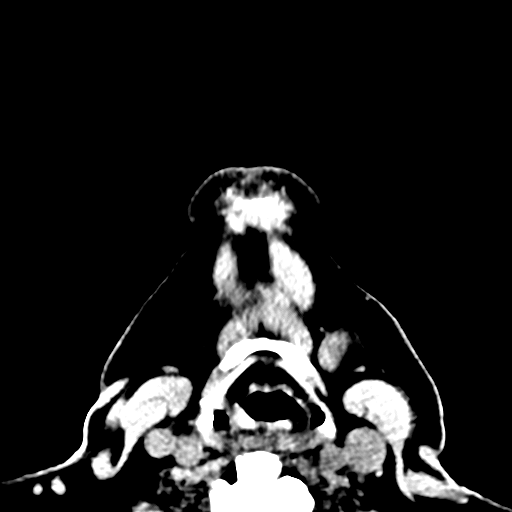
[im 15/72  brain]
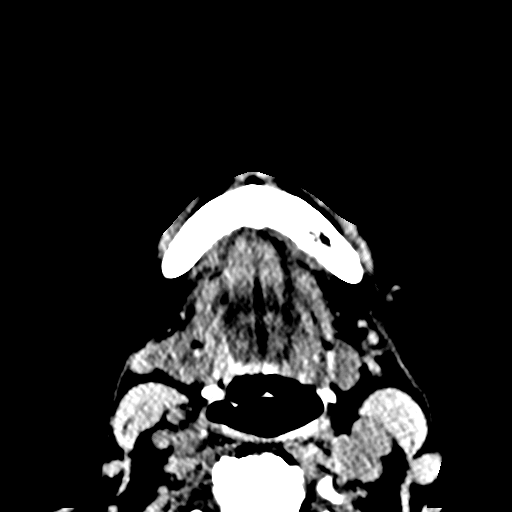
[im 22/72  brain]
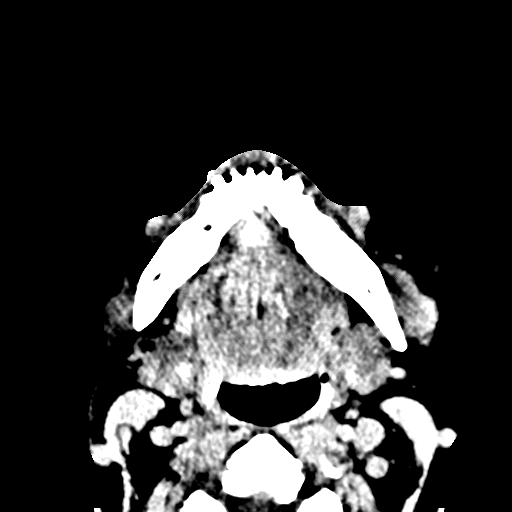
[im 32/72  brain]
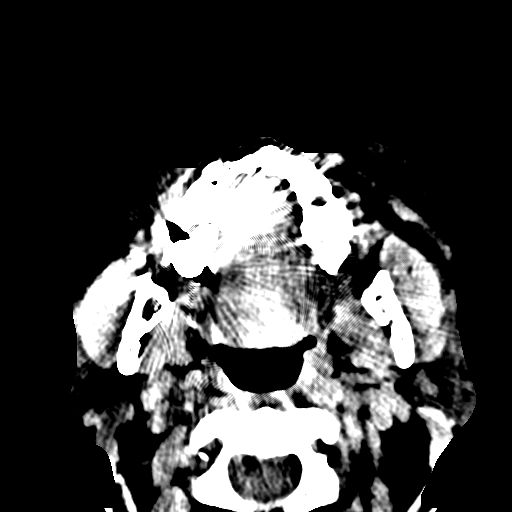
[im 40/72  brain]
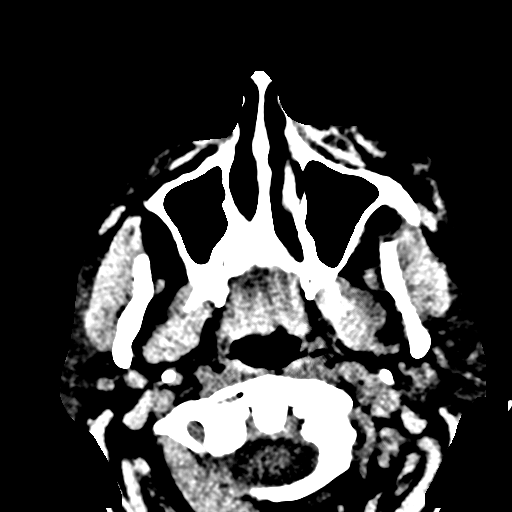

[Series 10: coronal soft · coronal · 0.34mm/px · 3 of 105 slices shown]
[im 54/105  bone]
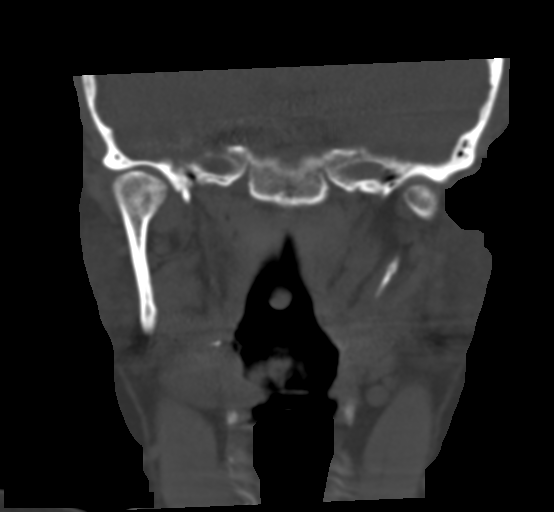
[im 64/105  bone]
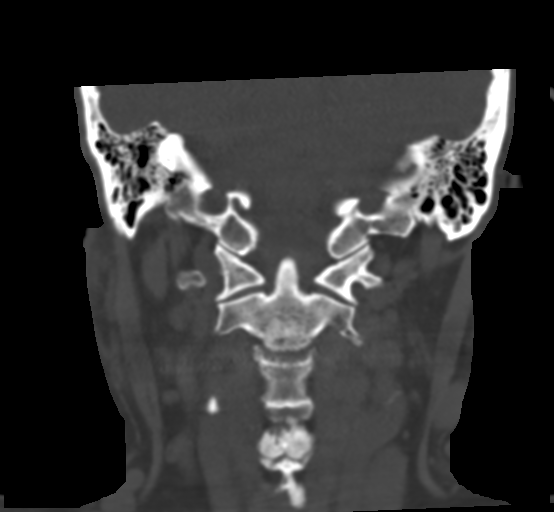
[im 74/105  bone]
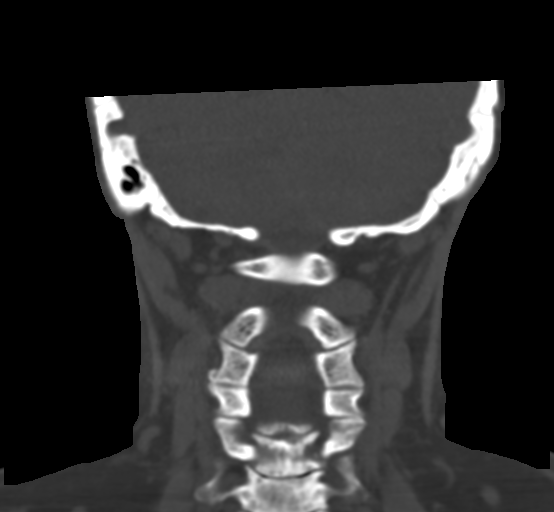

[Series 12: sagittal soft · sagittal · 0.34mm/px · 2 of 80 slices shown]
[im 27/80  bone]
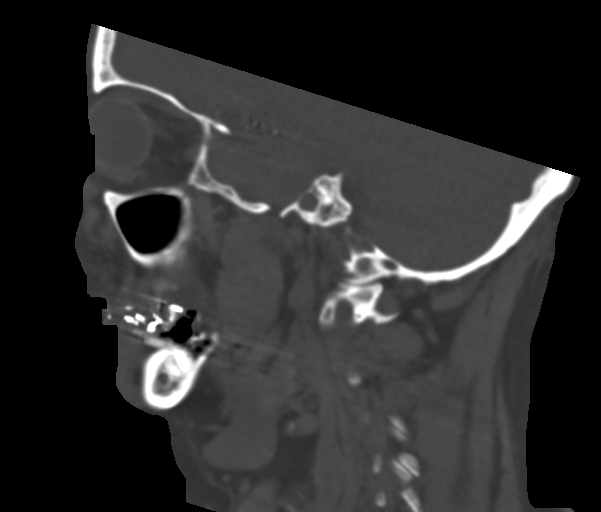
[im 53/80  bone]
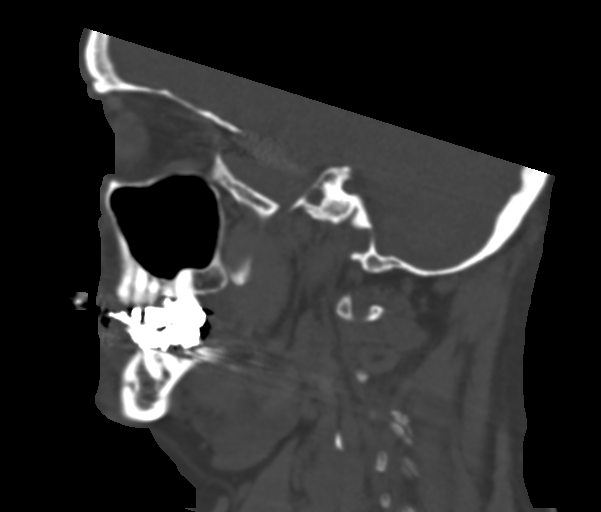

[17 of 47 positions shown; findings below may reference images not displayed]

FINDINGS: CT HEAD FINDINGS

Brain: Aneurysm coiling in the cavernous carotid bilaterally. Stent
in the left cavernous carotid. Negative for acute hemorrhage. Mild
chronic microvascular ischemia without acute infarct. No mass
lesion. Ventricle size normal.

Vascular: Negative for hyperdense vessel

Skull: Negative

Other: None

CT MAXILLOFACIAL FINDINGS

Osseous: Negative for facial fracture. Orbital floor intact. Nasal
bone intact.

Orbits: Bilateral cataract surgery.

Sinuses: Mucosal edema base of left maxillary sinus. Remaining
sinuses clear

Soft tissues: Soft tissue swelling inferior to the left orbit and
maxilla.
IMPRESSION: 1. No acute intracranial abnormality
2. Coiling of bilateral cavernous carotid aneurysm. Left cavernous
carotid stent. No acute intracranial hemorrhage
3. Negative for facial fracture. Soft tissue swelling inferior to
left orbit.

## 2020-10-28 ENCOUNTER — Other Ambulatory Visit: Payer: Self-pay | Admitting: Family Medicine

## 2020-11-09 ENCOUNTER — Ambulatory Visit: Payer: Medicare Other | Admitting: Family Medicine

## 2020-11-09 ENCOUNTER — Encounter: Payer: Self-pay | Admitting: Family Medicine

## 2020-11-09 ENCOUNTER — Ambulatory Visit (INDEPENDENT_AMBULATORY_CARE_PROVIDER_SITE_OTHER): Payer: Medicare Other | Admitting: Family Medicine

## 2020-11-09 ENCOUNTER — Other Ambulatory Visit: Payer: Self-pay

## 2020-11-09 VITALS — BP 134/78 | HR 63 | Temp 97.9°F | Resp 15 | Ht 61.25 in | Wt 202.6 lb

## 2020-11-09 DIAGNOSIS — E119 Type 2 diabetes mellitus without complications: Secondary | ICD-10-CM

## 2020-11-09 DIAGNOSIS — I10 Essential (primary) hypertension: Secondary | ICD-10-CM

## 2020-11-09 DIAGNOSIS — F418 Other specified anxiety disorders: Secondary | ICD-10-CM | POA: Diagnosis not present

## 2020-11-09 DIAGNOSIS — E785 Hyperlipidemia, unspecified: Secondary | ICD-10-CM | POA: Diagnosis not present

## 2020-11-09 DIAGNOSIS — Z6837 Body mass index (BMI) 37.0-37.9, adult: Secondary | ICD-10-CM | POA: Diagnosis not present

## 2020-11-09 DIAGNOSIS — Z1231 Encounter for screening mammogram for malignant neoplasm of breast: Secondary | ICD-10-CM | POA: Diagnosis not present

## 2020-11-09 DIAGNOSIS — Z23 Encounter for immunization: Secondary | ICD-10-CM | POA: Diagnosis not present

## 2020-11-09 DIAGNOSIS — E1169 Type 2 diabetes mellitus with other specified complication: Secondary | ICD-10-CM | POA: Diagnosis not present

## 2020-11-09 LAB — LIPID PANEL
Cholesterol: 196 mg/dL (ref 0–200)
HDL: 62.1 mg/dL (ref >39.00)
LDL Cholesterol: 113 mg/dL — ABNORMAL HIGH (ref 0–99)
NonHDL: 134.33
Total CHOL/HDL Ratio: 3
Triglycerides: 108 mg/dL (ref 0.0–149.0)
VLDL: 21.6 mg/dL (ref 0.0–40.0)

## 2020-11-09 LAB — BASIC METABOLIC PANEL
BUN: 18 mg/dL (ref 6–23)
CO2: 30 mEq/L (ref 19–32)
Calcium: 10.1 mg/dL (ref 8.4–10.5)
Chloride: 100 mEq/L (ref 96–112)
Creatinine, Ser: 0.73 mg/dL (ref 0.40–1.20)
GFR: 82.47 mL/min (ref >60.00)
Glucose, Bld: 142 mg/dL — ABNORMAL HIGH (ref 70–99)
Potassium: 3.7 mEq/L (ref 3.5–5.1)
Sodium: 140 mEq/L (ref 135–145)

## 2020-11-09 LAB — HEMOGLOBIN A1C: Hgb A1c MFr Bld: 6 % (ref 4.6–6.5)

## 2020-11-09 NOTE — Patient Instructions (Addendum)
Be sure to schedule your mammogram   I would like to get you started with some counseling/therapy  You will get a call about a counselor referral   Flu shot today   Labs today   Take care of yourself

## 2020-11-09 NOTE — Assessment & Plan Note (Signed)
referrral done for routine mammogram Encouraged to continue self breast exams

## 2020-11-09 NOTE — Assessment & Plan Note (Signed)
Discussed how this problem influences overall health and the risks it imposes  Reviewed plan for weight loss with lower calorie diet (via better food choices and also portion control or program like weight watchers) and exercise building up to or more than 30 minutes 5 days per week including some aerobic activity    

## 2020-11-09 NOTE — Assessment & Plan Note (Signed)
Worse depression lately  Copes by eating  Reviewed stressors/ coping techniques/symptoms/ support sources/ tx options and side effects in detail today Open to counseling, referral done  Enc good self care  Continues wellbutrin xl and fluoxetine 80 mg daily

## 2020-11-09 NOTE — Assessment & Plan Note (Addendum)
bp in fair control at this time  BP Readings from Last 1 Encounters:  11/09/20 134/78   No changes needed Most recent labs reviewed  Disc lifstyle change with low sodium diet and exercise  Plan to continue Losartan 100 mg daily  Amlodipine 10 mg daily  Hctz 25 mg daily  Propranolol ER 120 mg daily  Labs ordered

## 2020-11-09 NOTE — Assessment & Plan Note (Signed)
A1C ordered Taking metformin 500 mg bid  Per pt eating terribly due to stress  Taking statin and arb Eye exam in July- no retinopathy   Enc her to work on diet low in added sugar

## 2020-11-09 NOTE — Assessment & Plan Note (Signed)
Disc goals for lipids and reasons to control them Rev last labs with pt Rev low sat fat diet in detail Labs ordered  Taking pravasatin 20 mg daily  Diet is fair

## 2020-11-09 NOTE — Progress Notes (Signed)
Subjective:    Patient ID: Andrea Cooper, female    DOB: 06-May-1950, 70 y.o.   MRN: [id number] This visit occurred during the SARS-CoV-2 public health emergency.  Safety protocols were in place, including screening questions prior to the visit, additional usage of staff PPE, and extensive cleaning of exam room while observing appropriate contact time as indicated for disinfecting solutions.   HPI Pt presents for f/u of chronic health problems including HTN and DM2  Wt Readings from Last 3 Encounters:  11/09/20 202 lb 9.6 oz (91.9 kg)  06/02/20 209 lb 9 oz (95.1 kg)  05/10/20 210 lb 9 oz (95.5 kg)   37.97 kg/m  Doing well overall  Wants flu shot today  Plans on covid booster also   A lot more active recently  Had to clean after some remodeling  No longer watches TV during the day   Still gets a little swelling in L leg occ/ better and nl doppler/no DVT  HTN bp is stable today  No cp or palpitations or headaches or edema  No side effects to medicines  BP Readings from Last 3 Encounters:  11/09/20 134/78  06/02/20 122/70  05/10/20 124/80      Taking  Losartan 100 mg daily  Amlodipine 10 mg daily  Hctz 25 mg daily  Propranolol ER 120 mg daily   DM2 Lab Results  Component Value Date   HGBA1C 6.1 03/25/2020   Due for labs   Metformin 500 mg bid  Diet - terrible lately, not watching what she eats / carbs and sugar  Some emotional eating (stress/ had a huge fight with her sister)  She is mourning loss of her BIL (knew him her whole life)  Thinks a1c has been up   Taking statin and arb Eye exam 07/2020 with [facility]-no retinopathy   Foot care -good   Open to counseling  [place]  Virtual is ok    Hyperlipidemia Taking pravastatin 20 mg daily  Lab Results  Component Value Date   CHOL 167 03/25/2020   HDL 62.20 03/25/2020   [place] 88 03/25/2020   LDLDIRECT 160.7 01/14/2007   TRIG 82.0 03/25/2020   CHOLHDL 3 03/25/2020  Due for labs today   Patient  Active Problem List   Diagnosis Date Noted   Flu vaccine need 11/09/2020   Pain and swelling of left lower leg 06/02/2020   Tingling of both feet 06/02/2020   Current use of proton pump inhibitor 03/23/2020   Allergic dermatitis 01/07/2018   Dysphagia 01/26/2017   Hearing loss 02/14/2016   Estrogen deficiency 02/14/2016   GERD (gastroesophageal reflux disease) 08/13/2015   Visit for screening mammogram 07/25/2014   Encounter for Medicare annual wellness exam 07/14/2014   Hypokalemia 12/15/2013   Encounter for screening mammogram for breast cancer 06/07/2011   SLEEP APNEA 09/13/2009   Class 2 severe obesity due to excess calories with serious comorbidity and body mass index (BMI) of 37.0 to 37.9 in adult ([place]) 01/13/2009   POSTMENOPAUSAL STATUS 10/01/2008   BACK PAIN, LUMBAR, WITH RADICULOPATHY 08/10/2008   SWEATING 07/15/2008   Diabetes type 2, controlled ([place]) 05/22/2007   TOBACCO USE, QUIT 05/22/2007   CEREBRAL ANEURYSM 08/09/2006   Hypothyroidism 07/25/2006   Hyperlipidemia associated with type 2 diabetes mellitus ([place]) 07/25/2006   ANXIETY 07/25/2006   Depression with anxiety 07/25/2006   Essential hypertension 07/25/2006   IBS 07/25/2006   Polyphagia 07/25/2006   SKIN CANCER, HX OF 07/25/2006   Past Medical History:  Diagnosis  Date   Anxiety    Cataract 2019   corrected with surgery   Depression    Diabetes mellitus without complication ([place])    GERD (gastroesophageal reflux disease)    History of anxiety    History of cerebral aneurysm repair    history of cerebral aneurysm    History of depression    History of hyperglycemia    History of hyperlipidemia    History of hypothyroidism    History of melanoma    History of motion sickness    History of skin cancer    hx of melanoma left lower arm, recurrance   Hypercholesterolemia    Hypertension    Hypothyroidism    IBS (irritable bowel syndrome)    Nocturnal oxygen desaturation    PONV (postoperative nausea  and vomiting)    Thyroid disease    Past Surgical History:  Procedure Laterality Date   ABDOMINAL HYSTERECTOMY     APPENDECTOMY  1966   BREAST BIOPSY Left 07-29-14    core bx FIBROCYSTIC CHANGES WITH FEW MICROCALCIFICATIONS.   cardiolite  12/1997   normal EF 68%   CATARACT EXTRACTION W/ INTRAOCULAR LENS  IMPLANT, BILATERAL Bilateral 04/2017   [place]  2008, 2009   coiling at [place] N/A 01/22/2018   Procedure: COLONOSCOPY WITH PROPOFOL;  Surgeon: Toledo, Benay Pike, MD;  Location: [facility] ENDOSCOPY;  Service: Gastroenterology;  Laterality: N/A;   EYE SURGERY     RR   FOOT SURGERY  02/1998   bilateral   INTRAOPERATIVE ARTERIOGRAM  10/17/2015   MOHS SURGERY  1992   TOE SURGERY Left 2015   TONSILLECTOMY  1964   TOTAL ABDOMINAL HYSTERECTOMY W/ BILATERAL SALPINGOOPHORECTOMY     Social History   Tobacco Use   Smoking status: Former   Smokeless tobacco: Never  Vaping Use   Vaping Use: Never used  Substance Use Topics   Alcohol use: Yes    Alcohol/week: 1.0 standard drink    Types: 1 Standard drinks or equivalent per week    Comment: occasional wine   Drug use: No   Family History  Problem Relation Age of Onset   Diabetes Father    Depression Father    CAD Father    Diabetes Mother    Diverticulosis Mother    Adrenal disorder Mother    Breast cancer Other        distant relatives (all on mother's side)   Cerebral aneurysm Sister    Allergies  Allergen Reactions   Codeine Nausea And Vomiting    REACTION: nausea and vomiting   Lisinopril     Cough/throat symptoms    Latex Rash   Current Outpatient Medications on File Prior to Visit  Medication Sig Dispense Refill   amLODipine (NORVASC) 10 MG tablet Take 1 tablet (10 mg total) by mouth daily. 90 tablet 3   aspirin EC 81 MG tablet Take 81 mg by mouth daily.     buPROPion (WELLBUTRIN XL) 300 MG 24 hr tablet TAKE 1 TABLET DAILY 90 tablet 1   calcium carbonate  (CALCIUM 600) 600 MG TABS tablet Take 1 tablet (600 mg total) by mouth 2 (two) times daily. 180 tablet 3   Cholecalciferol (VITAMIN D3) 50 MCG (2000 UT) capsule Take 1 capsule (2,000 Units total) by mouth daily. 90 capsule 3   FLUoxetine (PROZAC) 40 MG capsule Take 2 capsules (80 mg total) by mouth daily. 180 capsule 3  glucose blood (ONETOUCH ULTRA) test strip USE 1 STRIP TO CHECK GLUCOSE ONCE DAILYH AS DIRECTED (daignosis code: E11.9) 100 each 1   hydrochlorothiazide (HYDRODIURIL) 25 MG tablet Take 1 tablet (25 mg total) by mouth daily. 90 tablet 3   hyoscyamine (LEVSIN SL) 0.125 MG SL tablet Place 0.125 mg under the tongue every 4 (four) hours as needed.     levothyroxine (SYNTHROID) 137 MCG tablet TAKE 1 TABLET DAILY BEFORE BREAKFAST 90 tablet 3   losartan (COZAAR) 100 MG tablet Take 1 tablet (100 mg total) by mouth daily. 90 tablet 3   metFORMIN (GLUCOPHAGE) 500 MG tablet Take 1 tablet (500 mg total) by mouth 2 (two) times daily with a meal. 180 tablet 3   omeprazole (PRILOSEC) 20 MG capsule Take 1 capsule (20 mg total) by mouth daily. 90 capsule 3   OneTouch Delica Lancets 29J MISC Check blood sugar twice a day and as directed. Dx E11.9 200 each 1   potassium chloride (KLOR-CON) 10 MEQ tablet Take 1 tablet (10 mEq total) by mouth daily. 90 tablet 3   pravastatin (PRAVACHOL) 20 MG tablet Take 1 tablet (20 mg total) by mouth daily. 90 tablet 3   propranolol ER (INDERAL LA) 120 MG 24 hr capsule Take 1 capsule (120 mg total) by mouth daily. 90 capsule 3   No current facility-administered medications on file prior to visit.    Review of Systems  Constitutional:  Negative for activity change, appetite change, fatigue, fever and unexpected weight change.  HENT:  Positive for hearing loss. Negative for congestion, ear pain, rhinorrhea, sinus pressure and sore throat.   Eyes:  Negative for pain, redness and visual disturbance.  Respiratory:  Negative for cough, shortness of breath and wheezing.    Cardiovascular:  Negative for chest pain and palpitations.  Gastrointestinal:  Negative for abdominal pain, blood in stool, constipation and diarrhea.  Endocrine: Negative for polydipsia and polyuria.  Genitourinary:  Negative for dysuria, frequency and urgency.  Musculoskeletal:  Positive for arthralgias. Negative for back pain and myalgias.  Skin:  Negative for pallor and rash.  Allergic/Immunologic: Negative for environmental allergies.  Neurological:  Negative for dizziness, syncope and headaches.  Hematological:  Negative for adenopathy. Does not bruise/bleed easily.  Psychiatric/Behavioral:  Positive for dysphoric mood. Negative for decreased concentration. The patient is not nervous/anxious.        Stressors      Objective:   Physical Exam Constitutional:      General: She is not in acute distress.    Appearance: Normal appearance. She is well-developed. She is obese. She is not ill-appearing or diaphoretic.  HENT:     Head: Normocephalic and atraumatic.  Eyes:     Conjunctiva/sclera: Conjunctivae normal.     Pupils: Pupils are equal, round, and reactive to light.  Neck:     Thyroid: No thyromegaly.     Vascular: No carotid bruit or JVD.  Cardiovascular:     Rate and Rhythm: Normal rate and regular rhythm.     Heart sounds: Normal heart sounds.    No gallop.  Pulmonary:     Effort: Pulmonary effort is normal. No respiratory distress.     Breath sounds: Normal breath sounds. No wheezing or rales.  Abdominal:     General: Bowel sounds are normal. There is no distension or abdominal bruit.     Palpations: Abdomen is soft. There is no mass.     Tenderness: There is no abdominal tenderness.  Musculoskeletal:  Cervical back: Normal range of motion and neck supple.     Right lower leg: No edema.     Left lower leg: No edema.  Lymphadenopathy:     Cervical: No cervical adenopathy.  Skin:    General: Skin is warm and dry.     Coloration: Skin is not pale.      Findings: No rash.  Neurological:     Mental Status: She is alert.     Coordination: Coordination normal.     Deep Tendon Reflexes: Reflexes are normal and symmetric. Reflexes normal.  Psychiatric:        Mood and Affect: Mood is depressed.        Cognition and Memory: Cognition and memory normal.     Comments: Pleasant  Talks candidly about stressors and mood           Assessment & Plan:   Problem List Items Addressed This Visit       Cardiovascular and Mediastinum   Essential hypertension    bp in fair control at this time  BP Readings from Last 1 Encounters:  11/09/20 134/78  No changes needed Most recent labs reviewed  Disc lifstyle change with low sodium diet and exercise  Plan to continue Losartan 100 mg daily  Amlodipine 10 mg daily  Hctz 25 mg daily  Propranolol ER 120 mg daily  Labs ordered       Relevant Orders   Basic metabolic panel (Completed)   Lipid panel (Completed)     Endocrine   Hyperlipidemia associated with type 2 diabetes mellitus ([place])    Disc goals for lipids and reasons to control them Rev last labs with pt Rev low sat fat diet in detail Labs ordered  Taking pravasatin 20 mg daily  Diet is fair       Relevant Orders   Lipid panel (Completed)   Diabetes type 2, controlled ([place]) - Primary    A1C ordered Taking metformin 500 mg bid  Per pt eating terribly due to stress  Taking statin and arb Eye exam in July- no retinopathy   Enc her to work on diet low in added sugar       Relevant Orders   Hemoglobin A1c (Completed)     Other   Class 2 severe obesity due to excess calories with serious comorbidity and body mass index (BMI) of 37.0 to 37.9 in adult [facility])    Discussed how this problem influences overall health and the risks it imposes  Reviewed plan for weight loss with lower calorie diet (via better food choices and also portion control or program like weight watchers) and exercise building up to or more than 30 minutes 5  days per week including some aerobic activity         Depression with anxiety    Worse depression lately  Copes by eating  Reviewed stressors/ coping techniques/symptoms/ support sources/ tx options and side effects in detail today Open to counseling, referral done  Enc good self care  Continues wellbutrin xl and fluoxetine 80 mg daily      Relevant Orders   Ambulatory referral to Psychology   Encounter for screening mammogram for breast cancer    referrral done for routine mammogram Encouraged to continue self breast exams      Relevant Orders   MM 3D SCREEN BREAST BILATERAL   Flu vaccine need   Relevant Orders   Flu Vaccine QUAD High Dose(Fluad) (Completed)

## 2020-11-25 ENCOUNTER — Telehealth: Payer: Self-pay | Admitting: Family Medicine

## 2020-11-25 DIAGNOSIS — G8929 Other chronic pain: Secondary | ICD-10-CM

## 2020-11-25 NOTE — Telephone Encounter (Signed)
Pt called stating that she is having back problem. Pt states that she had seen Dr Lorelei Pont. Pt states that she didn't want to drive all the way to [place]. Pt would like a referral for an orthopedic doctor in [place]. I told pt that she would need to make an appt but pt refused.,

## 2020-11-25 NOTE — Addendum Note (Signed)
Addended by: Loura Pardon A on: 11/25/2020 11:40 AM   Modules accepted: Orders

## 2020-11-25 NOTE — Telephone Encounter (Signed)
I went ahead with a referral so she will get a call   I think this is an ongoing problem

## 2020-12-03 ENCOUNTER — Ambulatory Visit (INDEPENDENT_AMBULATORY_CARE_PROVIDER_SITE_OTHER): Payer: Medicare Other | Admitting: Family

## 2020-12-03 ENCOUNTER — Encounter: Payer: Self-pay | Admitting: Family

## 2020-12-03 ENCOUNTER — Other Ambulatory Visit: Payer: Self-pay

## 2020-12-03 VITALS — HR 80 | Temp 100.2°F | Ht 61.25 in | Wt 200.0 lb

## 2020-12-03 DIAGNOSIS — J069 Acute upper respiratory infection, unspecified: Secondary | ICD-10-CM | POA: Diagnosis not present

## 2020-12-03 DIAGNOSIS — R509 Fever, unspecified: Secondary | ICD-10-CM | POA: Insufficient documentation

## 2020-12-03 LAB — POCT INFLUENZA A/B
Influenza A, POC: NEGATIVE
Influenza B, POC: NEGATIVE

## 2020-12-03 NOTE — Assessment & Plan Note (Signed)
Flu swab ordered, pt to drive here to obtain. Pending results.  If positive will treat with tamiflu 75 mg bid x 5 days #10 Tylenol prn fever.

## 2020-12-03 NOTE — Assessment & Plan Note (Signed)
Flu negative. Will treat as viral. Advised patient on supportive measures:  Be sure to rest, drink plenty of fluids.Follow up if fever >101, if symptoms worsen or if symptoms are not improved in 3 days. Patient verbalizes understanding.

## 2020-12-03 NOTE — Progress Notes (Signed)
Virtual telephone visit    Virtual Visit via Telephone Note   This visit type was conducted due to national recommendations for restrictions regarding the COVID-19 Pandemic (e.g. social distancing) in an effort to limit this patient's exposure and mitigate transmission in our community. Due to her co-morbid illnesses, this patient is at least at moderate risk for complications without adequate follow up. This format is felt to be most appropriate for this patient at this time. The patient did not have access to video technology or had technical difficulties with video requiring transitioning to audio format only (telephone). Physical exam was limited to content and character of the telephone converstion. CMA was able to get the patient set up on a telephone visit.   Patient location: Home. Patient and provider in visit Provider location: Office  I discussed the limitations of evaluation and management by telemedicine and the availability of in person appointments. The patient expressed understanding and agreed to proceed.   Visit Date: 12/03/2020  Today's healthcare provider: Eugenia Pancoast, FNP     Subjective:    Patient ID: Andrea Cooper, female    DOB: 29-Feb-1950, 70 y.o.   MRN: [id number]  Chief Complaint  Patient presents with   Headache   Generalized Body Aches   Fever    Negative Covid test    Headache  Associated symptoms include a fever. Pertinent negatives include no coughing, ear pain or sore throat.  Fever  Associated symptoms include congestion. Pertinent negatives include no chest pain, coughing, ear pain, headaches or sore throat.   Andrea Cooper is here today with c/o two day h/o low grade fever,(this am 100.2) chills, nasal congestion, watery eyes and itchy, mild sore throat. Mild sinus pressure. Took zicam otc with only mild relief , tylenol helping a bit with aches. Negative covid test this am negative.mild diarrhea.  Denies ear pain, and or chest  congestion. No cough. No nausea.  [place] daughter tested positive for flu.   Past Medical History:  Diagnosis Date   Anxiety    Cataract 2019   corrected with surgery   Depression    Diabetes mellitus without complication ([place])    GERD (gastroesophageal reflux disease)    History of anxiety    History of cerebral aneurysm repair    history of cerebral aneurysm    History of depression    History of hyperglycemia    History of hyperlipidemia    History of hypothyroidism    History of melanoma    History of motion sickness    History of skin cancer    hx of melanoma left lower arm, recurrance   Hypercholesterolemia    Hypertension    Hypothyroidism    IBS (irritable bowel syndrome)    Nocturnal oxygen desaturation    PONV (postoperative nausea and vomiting)    Thyroid disease     Past Surgical History:  Procedure Laterality Date   ABDOMINAL HYSTERECTOMY     APPENDECTOMY  1966   BREAST BIOPSY Left 07-29-14    core bx FIBROCYSTIC CHANGES WITH FEW MICROCALCIFICATIONS.   cardiolite  12/1997   normal EF 68%   CATARACT EXTRACTION W/ INTRAOCULAR LENS  IMPLANT, BILATERAL Bilateral 04/2017   [place]  2008, 2009   coiling at [place] N/A 01/22/2018   Procedure: COLONOSCOPY WITH PROPOFOL;  Surgeon: Toledo, Benay Pike, MD;  Location: [facility] ENDOSCOPY;  Service: Gastroenterology;  Laterality: N/A;   EYE  SURGERY     RR   FOOT SURGERY  02/1998   bilateral   INTRAOPERATIVE ARTERIOGRAM  10/17/2015   MOHS SURGERY  1992   TOE SURGERY Left 2015   TONSILLECTOMY  1964   TOTAL ABDOMINAL HYSTERECTOMY W/ BILATERAL SALPINGOOPHORECTOMY      Family History  Problem Relation Age of Onset   Diabetes Father    Depression Father    CAD Father    Diabetes Mother    Diverticulosis Mother    Adrenal disorder Mother    Breast cancer Other        distant relatives (all on mother's side)   Cerebral aneurysm Sister     Social History    Socioeconomic History   Marital status: Married    Spouse name: Not on file   Number of children: Not on file   Years of education: Not on file   Highest education level: Not on file  Occupational History   Not on file  Tobacco Use   Smoking status: Former   Smokeless tobacco: Never  Vaping Use   Vaping Use: Never used  Substance and Sexual Activity   Alcohol use: Yes    Alcohol/week: 1.0 standard drink    Types: 1 Standard drinks or equivalent per week    Comment: occasional wine   Drug use: No   Sexual activity: Never  Other Topics Concern   Not on file  Social History Narrative   Not on file   Social Determinants of Health   Financial Resource Strain: Low Risk    Difficulty of Paying Living Expenses: Not hard at all  Food Insecurity: No Food Insecurity   Worried About Charity fundraiser in the Last Year: Never true   [place] in the Last Year: Never true  Transportation Needs: No Transportation Needs   Lack of Transportation (Medical): No   Lack of Transportation (Non-Medical): No  Physical Activity: Inactive   Days of Exercise per Week: 0 days   Minutes of Exercise per Session: 0 min  Stress: No Stress Concern Present   Feeling of Stress : Not at all  Social Connections: Not on file  Intimate Partner Violence: Not At Risk   Fear of Current or Ex-Partner: No   Emotionally Abused: No   Physically Abused: No   Sexually Abused: No    Outpatient Medications Prior to Visit  Medication Sig Dispense Refill   amLODipine (NORVASC) 10 MG tablet Take 1 tablet (10 mg total) by mouth daily. 90 tablet 3   aspirin EC 81 MG tablet Take 81 mg by mouth daily.     buPROPion (WELLBUTRIN XL) 300 MG 24 hr tablet TAKE 1 TABLET DAILY 90 tablet 1   calcium carbonate (CALCIUM 600) 600 MG TABS tablet Take 1 tablet (600 mg total) by mouth 2 (two) times daily. 180 tablet 3   Cholecalciferol (VITAMIN D3) 50 MCG (2000 UT) capsule Take 1 capsule (2,000 Units total) by mouth  daily. 90 capsule 3   FLUoxetine (PROZAC) 40 MG capsule Take 2 capsules (80 mg total) by mouth daily. 180 capsule 3   glucose blood (ONETOUCH ULTRA) test strip USE 1 STRIP TO CHECK GLUCOSE ONCE DAILYH AS DIRECTED (daignosis code: E11.9) 100 each 1   Homeopathic Products (ZICAM COLD REMEDY PO) Take by mouth.     hydrochlorothiazide (HYDRODIURIL) 25 MG tablet Take 1 tablet (25 mg total) by mouth daily. 90 tablet 3   hyoscyamine (LEVSIN SL) 0.125 MG SL tablet Place  0.125 mg under the tongue every 4 (four) hours as needed.     levothyroxine (SYNTHROID) 137 MCG tablet TAKE 1 TABLET DAILY BEFORE BREAKFAST 90 tablet 3   losartan (COZAAR) 100 MG tablet Take 1 tablet (100 mg total) by mouth daily. 90 tablet 3   metFORMIN (GLUCOPHAGE) 500 MG tablet Take 1 tablet (500 mg total) by mouth 2 (two) times daily with a meal. 180 tablet 3   omeprazole (PRILOSEC) 20 MG capsule Take 1 capsule (20 mg total) by mouth daily. 90 capsule 3   OneTouch Delica Lancets 25D MISC Check blood sugar twice a day and as directed. Dx E11.9 200 each 1   potassium chloride (KLOR-CON) 10 MEQ tablet Take 1 tablet (10 mEq total) by mouth daily. 90 tablet 3   pravastatin (PRAVACHOL) 20 MG tablet Take 1 tablet (20 mg total) by mouth daily. 90 tablet 3   propranolol ER (INDERAL LA) 120 MG 24 hr capsule Take 1 capsule (120 mg total) by mouth daily. 90 capsule 3   No facility-administered medications prior to visit.    Allergies  Allergen Reactions   Codeine Nausea And Vomiting    REACTION: nausea and vomiting   Lisinopril     Cough/throat symptoms    Latex Rash    Review of Systems  Constitutional:  Positive for chills and fever.  HENT:  Positive for congestion and sinus pain. Negative for ear pain and sore throat.   Respiratory:  Negative for cough.   Cardiovascular:  Negative for chest pain.  Neurological:  Negative for headaches.      Objective:    Physical Exam Neurological:     Mental Status: She is alert and  oriented to person, place, and time.    Pulse 80   Temp 100.2 F (37.9 C) (Oral)   Ht 5' 1.25" (1.556 m)   Wt 200 lb (90.7 kg)   BMI 37.48 kg/m  Wt Readings from Last 3 Encounters:  12/03/20 200 lb (90.7 kg)  11/09/20 202 lb 9.6 oz (91.9 kg)  06/02/20 209 lb 9 oz (95.1 kg)        Assessment & Plan:   Problem List Items Addressed This Visit       Respiratory   Upper respiratory infection, viral - Primary    Flu negative. Will treat as viral. Advised patient on supportive measures:  Be sure to rest, drink plenty of fluids.Follow up if fever >101, if symptoms worsen or if symptoms are not improved in 3 days. Patient verbalizes understanding.          Other   Fever and chills    Flu swab ordered, pt to drive here to obtain. Pending results.  If positive will treat with tamiflu 75 mg bid x 5 days #10 Tylenol prn fever.       Relevant Orders   POCT Influenza A/B (Completed)    I am having Francely A. Blazier maintain her aspirin EC, hyoscyamine, losartan, amLODipine, FLUoxetine, hydrochlorothiazide, levothyroxine, metFORMIN, omeprazole, potassium chloride, pravastatin, propranolol ER, OneTouch Delica Lancets 66Y, OneTouch Ultra, calcium carbonate, Vitamin D3, buPROPion, and Homeopathic Products (ZICAM COLD REMEDY PO).  No orders of the defined types were placed in this encounter.  Video connection was lost when more than 50% of the duration of the visit was complete, at which time the remainder of the visit was completed via audio only.   I discussed the assessment and treatment plan with the patient. The patient was provided an opportunity to ask questions  and all were answered. The patient agreed with the plan and demonstrated an understanding of the instructions.   The patient was advised to call back or seek an in-person evaluation if the symptoms worsen or if the condition fails to improve as anticipated.  I provided 15 minutes of non-face-to-face time during this  encounter.   Eugenia Pancoast, [place] [facility] [place] [phone] (phone) [phone] (fax)  [place]

## 2020-12-03 NOTE — Patient Instructions (Signed)
Be sure to rest, drink plenty of fluids, and use tylenol or ibuprofen as needed for pain. Follow up if fever >101, if symptoms worsen or if symptoms are not improved in 3 days.   You should be feeling better by day seven of symptoms, but please do contact me if this is not the case.   It was a pleasure seeing you today! Please do not hesitate to reach out with any questions and or concerns.  Regards,   Eugenia Pancoast

## 2020-12-03 NOTE — Progress Notes (Deleted)
Established Patient Office Visit  Subjective:  Patient ID: Andrea Cooper, female    DOB: 20-Nov-1950  Age: 70 y.o. MRN: [id number]  CC:  Chief Complaint  Patient presents with   Headache   Generalized Body Aches   Fever    Negative Covid test    HPI Andrea Cooper is here today with c/o two day h/o low grade fever,(this am 100.2) chills, nasal congestion, watery eyes and itchy, mild sore throat. Mild sinus pressure. Took zicam otc with only mild relief , tylenol helping a bit with aches. Negative covid test this am negative.mild diarrhea.  Denies ear pain, and or chest congestion. No cough. No nausea.  [place] daughter tested positive for flu.    Past Medical History:  Diagnosis Date   Anxiety    Cataract 2019   corrected with surgery   Depression    Diabetes mellitus without complication ([place])    GERD (gastroesophageal reflux disease)    History of anxiety    History of cerebral aneurysm repair    history of cerebral aneurysm    History of depression    History of hyperglycemia    History of hyperlipidemia    History of hypothyroidism    History of melanoma    History of motion sickness    History of skin cancer    hx of melanoma left lower arm, recurrance   Hypercholesterolemia    Hypertension    Hypothyroidism    IBS (irritable bowel syndrome)    Nocturnal oxygen desaturation    PONV (postoperative nausea and vomiting)    Thyroid disease     Past Surgical History:  Procedure Laterality Date   ABDOMINAL HYSTERECTOMY     APPENDECTOMY  1966   BREAST BIOPSY Left 07-29-14    core bx FIBROCYSTIC CHANGES WITH FEW MICROCALCIFICATIONS.   cardiolite  12/1997   normal EF 68%   CATARACT EXTRACTION W/ INTRAOCULAR LENS  IMPLANT, BILATERAL Bilateral 04/2017   [place]  2008, 2009   coiling at [place] N/A 01/22/2018   Procedure: COLONOSCOPY WITH PROPOFOL;  Surgeon: Toledo, Benay Pike, MD;  Location: [facility]  ENDOSCOPY;  Service: Gastroenterology;  Laterality: N/A;   EYE SURGERY     RR   FOOT SURGERY  02/1998   bilateral   INTRAOPERATIVE ARTERIOGRAM  10/17/2015   MOHS SURGERY  1992   TOE SURGERY Left 2015   TONSILLECTOMY  1964   TOTAL ABDOMINAL HYSTERECTOMY W/ BILATERAL SALPINGOOPHORECTOMY      Family History  Problem Relation Age of Onset   Diabetes Father    Depression Father    CAD Father    Diabetes Mother    Diverticulosis Mother    Adrenal disorder Mother    Breast cancer Other        distant relatives (all on mother's side)   Cerebral aneurysm Sister     Social History   Socioeconomic History   Marital status: Married    Spouse name: Not on file   Number of children: Not on file   Years of education: Not on file   Highest education level: Not on file  Occupational History   Not on file  Tobacco Use   Smoking status: Former   Smokeless tobacco: Never  Vaping Use   Vaping Use: Never used  Substance and Sexual Activity   Alcohol use: Yes    Alcohol/week: 1.0 standard drink    Types: 1 Standard  drinks or equivalent per week    Comment: occasional wine   Drug use: No   Sexual activity: Never  Other Topics Concern   Not on file  Social History Narrative   Not on file   Social Determinants of Health   Financial Resource Strain: Low Risk    Difficulty of Paying Living Expenses: Not hard at all  Food Insecurity: No Food Insecurity   Worried About Charity fundraiser in the Last Year: Never true   [place] in the Last Year: Never true  Transportation Needs: No Transportation Needs   Lack of Transportation (Medical): No   Lack of Transportation (Non-Medical): No  Physical Activity: Inactive   Days of Exercise per Week: 0 days   Minutes of Exercise per Session: 0 min  Stress: No Stress Concern Present   Feeling of Stress : Not at all  Social Connections: Not on file  Intimate Partner Violence: Not At Risk   Fear of Current or Ex-Partner: No    Emotionally Abused: No   Physically Abused: No   Sexually Abused: No    Outpatient Medications Prior to Visit  Medication Sig Dispense Refill   amLODipine (NORVASC) 10 MG tablet Take 1 tablet (10 mg total) by mouth daily. 90 tablet 3   aspirin EC 81 MG tablet Take 81 mg by mouth daily.     buPROPion (WELLBUTRIN XL) 300 MG 24 hr tablet TAKE 1 TABLET DAILY 90 tablet 1   calcium carbonate (CALCIUM 600) 600 MG TABS tablet Take 1 tablet (600 mg total) by mouth 2 (two) times daily. 180 tablet 3   Cholecalciferol (VITAMIN D3) 50 MCG (2000 UT) capsule Take 1 capsule (2,000 Units total) by mouth daily. 90 capsule 3   FLUoxetine (PROZAC) 40 MG capsule Take 2 capsules (80 mg total) by mouth daily. 180 capsule 3   glucose blood (ONETOUCH ULTRA) test strip USE 1 STRIP TO CHECK GLUCOSE ONCE DAILYH AS DIRECTED (daignosis code: E11.9) 100 each 1   Homeopathic Products (ZICAM COLD REMEDY PO) Take by mouth.     hydrochlorothiazide (HYDRODIURIL) 25 MG tablet Take 1 tablet (25 mg total) by mouth daily. 90 tablet 3   hyoscyamine (LEVSIN SL) 0.125 MG SL tablet Place 0.125 mg under the tongue every 4 (four) hours as needed.     levothyroxine (SYNTHROID) 137 MCG tablet TAKE 1 TABLET DAILY BEFORE BREAKFAST 90 tablet 3   losartan (COZAAR) 100 MG tablet Take 1 tablet (100 mg total) by mouth daily. 90 tablet 3   metFORMIN (GLUCOPHAGE) 500 MG tablet Take 1 tablet (500 mg total) by mouth 2 (two) times daily with a meal. 180 tablet 3   omeprazole (PRILOSEC) 20 MG capsule Take 1 capsule (20 mg total) by mouth daily. 90 capsule 3   OneTouch Delica Lancets 26V MISC Check blood sugar twice a day and as directed. Dx E11.9 200 each 1   potassium chloride (KLOR-CON) 10 MEQ tablet Take 1 tablet (10 mEq total) by mouth daily. 90 tablet 3   pravastatin (PRAVACHOL) 20 MG tablet Take 1 tablet (20 mg total) by mouth daily. 90 tablet 3   propranolol ER (INDERAL LA) 120 MG 24 hr capsule Take 1 capsule (120 mg total) by mouth daily. 90  capsule 3   No facility-administered medications prior to visit.    Allergies  Allergen Reactions   Codeine Nausea And Vomiting    REACTION: nausea and vomiting   Lisinopril     Cough/throat symptoms  Latex Rash    ROS Review of Systems  Constitutional:  Positive for chills and fever.  HENT:  Positive for congestion and sinus pressure. Negative for ear pain and sore throat.   Respiratory:  Negative for cough, shortness of breath and wheezing.   Cardiovascular:  Negative for chest pain and palpitations.     Objective:    Physical Exam Constitutional:      General: She is not in acute distress. Neurological:     Mental Status: She is alert and oriented to person, place, and time.     Pulse 80   Temp 100.2 F (37.9 C) (Oral)   Ht 5' 1.25" (1.556 m)   Wt 200 lb (90.7 kg)   BMI 37.48 kg/m  Wt Readings from Last 3 Encounters:  12/03/20 200 lb (90.7 kg)  11/09/20 202 lb 9.6 oz (91.9 kg)  06/02/20 209 lb 9 oz (95.1 kg)     Health Maintenance Due  Topic Date Due   COVID-19 Vaccine (4 - Booster for Pfizer series) 12/25/2019   MAMMOGRAM  09/28/2020    There are no preventive care reminders to display for this patient.  Lab Results  Component Value Date   TSH 1.32 03/25/2020   Lab Results  Component Value Date   WBC 5.4 03/25/2020   HGB 13.4 03/25/2020   HCT 39.6 03/25/2020   MCV 89.9 03/25/2020   PLT 271.0 03/25/2020   Lab Results  Component Value Date   NA 140 11/09/2020   K 3.7 11/09/2020   CO2 30 11/09/2020   GLUCOSE 142 (H) 11/09/2020   BUN 18 11/09/2020   CREATININE 0.73 11/09/2020   BILITOT 0.5 03/25/2020   ALKPHOS 53 03/25/2020   AST 15 03/25/2020   ALT 23 03/25/2020   PROT 6.7 03/25/2020   ALBUMIN 4.1 03/25/2020   CALCIUM 10.1 11/09/2020   ANIONGAP 11 01/30/2019   GFR 82.47 11/09/2020   Lab Results  Component Value Date   CHOL 196 11/09/2020   Lab Results  Component Value Date   HDL 62.10 11/09/2020   Lab Results  Component  Value Date   LDLCALC 113 (H) 11/09/2020   Lab Results  Component Value Date   TRIG 108.0 11/09/2020   Lab Results  Component Value Date   CHOLHDL 3 11/09/2020   Lab Results  Component Value Date   HGBA1C 6.0 11/09/2020      Assessment & Plan:   Problem List Items Addressed This Visit   None   No orders of the defined types were placed in this encounter.   Follow-up: No follow-ups on file.    Eugenia Pancoast, FNP

## 2020-12-21 DIAGNOSIS — Z20822 Contact with and (suspected) exposure to covid-19: Secondary | ICD-10-CM | POA: Diagnosis not present

## 2020-12-21 DIAGNOSIS — Z23 Encounter for immunization: Secondary | ICD-10-CM | POA: Diagnosis not present

## 2020-12-30 ENCOUNTER — Ambulatory Visit: Payer: Medicare Other | Admitting: Psychologist

## 2021-01-19 ENCOUNTER — Encounter: Payer: Self-pay | Admitting: Family Medicine

## 2021-01-27 ENCOUNTER — Telehealth: Payer: Self-pay | Admitting: Family Medicine

## 2021-01-27 NOTE — Telephone Encounter (Signed)
Pt called she will be out of the buPROPion (WELLBUTRIN XL) 300 MG 24 hr tablet on Monday she said that a PA was sent over since the medication has increased in price

## 2021-01-28 ENCOUNTER — Other Ambulatory Visit: Payer: Self-pay

## 2021-01-28 ENCOUNTER — Ambulatory Visit
Admission: RE | Admit: 2021-01-28 | Discharge: 2021-01-28 | Disposition: A | Discharge status: Home / Self Care | Payer: Medicare Other | Source: Ambulatory Visit | Attending: Family Medicine | Admitting: Family Medicine

## 2021-01-28 DIAGNOSIS — Z1231 Encounter for screening mammogram for malignant neoplasm of breast: Secondary | ICD-10-CM | POA: Diagnosis not present

## 2021-01-28 NOTE — Telephone Encounter (Signed)
Spoke with patient. PA has been faxed over with additional information they requested. Advised patient. Patient appreciated the effort. She went ahead and purchased this RX with the increased price and hopefully PA will go through for next round and will be cheaper. Nothing further is needed at this time

## 2021-01-28 NOTE — Telephone Encounter (Signed)
Pt called in to know status of medication stated that the cost has change . And was thinking of possible alternative . Please Advise [phone]

## 2021-01-28 NOTE — Telephone Encounter (Signed)
Working on this now

## 2021-01-31 ENCOUNTER — Ambulatory Visit (INDEPENDENT_AMBULATORY_CARE_PROVIDER_SITE_OTHER): Payer: Medicare Other | Admitting: Psychologist

## 2021-01-31 ENCOUNTER — Other Ambulatory Visit: Payer: Self-pay

## 2021-01-31 DIAGNOSIS — F330 Major depressive disorder, recurrent, mild: Secondary | ICD-10-CM | POA: Diagnosis not present

## 2021-01-31 NOTE — Progress Notes (Signed)
[facility] Counselor Initial Adult Exam  Name: Andrea Cooper Date: 01/31/2021 MRN: [id number] DOB: 24-Mar-1950 PCP: Abner Greenspan, MD  Time spent: 9:02 am to 9:32 am; total time: 30 minutes  This session was held via in person. The patient consented to in-person therapy and was in the clinician's office. Limits of confidentiality were discussed with the patient.   Guardian/Payee:  NA    Paperwork requested: No   Reason for Visit /Presenting Problem: Dr. Glori Bickers recommending therapy for depression.  Mental Status Exam: Appearance:   Well Groomed     Behavior:  Appropriate  Motor:  Normal  Speech/Language:   Normal Rate  Affect:  Appropriate  Mood:  normal  Thought process:  normal  Thought content:    WNL  Sensory/Perceptual disturbances:    WNL  Orientation:  oriented to person, place, and time/date  Attention:  Good  Concentration:  Good  Memory:  WNL  Fund of knowledge:   Good  Insight:    Good  Judgment:   Good  Impulse Control:  Good     Reported Symptoms:  The patient endorsed experiencing the following: rumination of negative thoughts, feeling down, feeling sad, social isolation, avoiding pleasurable activities, and low self-esteem. She denied suicidal and homicidal ideation.   Risk Assessment: Danger to Self:  No Self-injurious Behavior: No Danger to Others: No Duty to Warn:no Physical Aggression / Violence:No  Access to Firearms a concern: No  Gang Involvement:No  Patient / guardian was educated about steps to take if suicide or homicide risk level increases between visits: no While future psychiatric events cannot be accurately predicted, the patient does not currently require acute inpatient psychiatric care and does not currently meet [facility] involuntary commitment criteria.  Substance Abuse History: Current substance abuse: No     Past Psychiatric History:   Previous psychological history is significant for depression Outpatient  Providers:Patient saw a Lebron Quam 20 years ago.  History of Psych Hospitalization: No  Psychological Testing:  NA    Abuse History:  Victim of: No.,  NA    Report needed: No. Victim of Neglect:No. Perpetrator of  NA   Witness / Exposure to Domestic Violence: No   Protective Services Involvement: No  Witness to [employer] Violence:  No   Family History:  Family History  Problem Relation Age of Onset   Diabetes Father    Depression Father    CAD Father    Diabetes Mother    Diverticulosis Mother    Adrenal disorder Mother    Breast cancer Other        distant relatives (all on mother's side)   Cerebral aneurysm Sister     Living situation: the patient lives alone  Sexual Orientation: Straight  Relationship Status: divorced  Name of spouse / other:NA If a parent, number of children / ages: Patient has one daughter whose name is Geographical information systems officer. Patient has two granddaughters.   Support Systems: Patient described her daughter and friends as making up her support system.   Financial Stress:  No   Income/Employment/Disability: Actor: No   Educational History: Education: some college  Religion/Sprituality/World View: Patient described herself as Engineer, manufacturing and indicated that it is important to her.   Any cultural differences that may affect / interfere with treatment:  not applicable   Recreation/Hobbies: Patient stated that she enjoys shopping, spending time with friends, and watching her granddaughter play soccer.   Stressors: Other: NA    Strengths:  Supportive Relationships, Family, and Self Advocate  Barriers:  NA   Legal History: Pending legal issue / charges: The patient has no significant history of legal issues. History of legal issue / charges:  NA  Medical History/Surgical History: reviewed Past Medical History:  Diagnosis Date   Anxiety    Cataract 2019   corrected with surgery   Depression    Diabetes mellitus  without complication ([place])    GERD (gastroesophageal reflux disease)    History of anxiety    History of cerebral aneurysm repair    history of cerebral aneurysm    History of depression    History of hyperglycemia    History of hyperlipidemia    History of hypothyroidism    History of melanoma    History of motion sickness    History of skin cancer    hx of melanoma left lower arm, recurrance   Hypercholesterolemia    Hypertension    Hypothyroidism    IBS (irritable bowel syndrome)    Nocturnal oxygen desaturation    PONV (postoperative nausea and vomiting)    Thyroid disease     Past Surgical History:  Procedure Laterality Date   ABDOMINAL HYSTERECTOMY     APPENDECTOMY  1966   BREAST BIOPSY Left 07-29-14    core bx FIBROCYSTIC CHANGES WITH FEW MICROCALCIFICATIONS.   cardiolite  12/1997   normal EF 68%   CATARACT EXTRACTION W/ INTRAOCULAR LENS  IMPLANT, BILATERAL Bilateral 04/2017   [place]  2008, 2009   coiling at [place] N/A 01/22/2018   Procedure: COLONOSCOPY WITH PROPOFOL;  Surgeon: Toledo, Benay Pike, MD;  Location: [facility] ENDOSCOPY;  Service: Gastroenterology;  Laterality: N/A;   EYE SURGERY     RR   FOOT SURGERY  02/1998   bilateral   INTRAOPERATIVE ARTERIOGRAM  10/17/2015   MOHS SURGERY  1992   TOE SURGERY Left 2015   TONSILLECTOMY  1964   TOTAL ABDOMINAL HYSTERECTOMY W/ BILATERAL SALPINGOOPHORECTOMY      Medications: Current Outpatient Medications  Medication Sig Dispense Refill   amLODipine (NORVASC) 10 MG tablet Take 1 tablet (10 mg total) by mouth daily. 90 tablet 3   aspirin EC 81 MG tablet Take 81 mg by mouth daily.     buPROPion (WELLBUTRIN XL) 300 MG 24 hr tablet TAKE 1 TABLET DAILY 90 tablet 1   calcium carbonate (CALCIUM 600) 600 MG TABS tablet Take 1 tablet (600 mg total) by mouth 2 (two) times daily. 180 tablet 3   Cholecalciferol (VITAMIN D3) 50 MCG (2000 UT) capsule Take 1 capsule (2,000  Units total) by mouth daily. 90 capsule 3   FLUoxetine (PROZAC) 40 MG capsule Take 2 capsules (80 mg total) by mouth daily. 180 capsule 3   glucose blood (ONETOUCH ULTRA) test strip USE 1 STRIP TO CHECK GLUCOSE ONCE DAILYH AS DIRECTED (daignosis code: E11.9) 100 each 1   Homeopathic Products (ZICAM COLD REMEDY PO) Take by mouth.     hydrochlorothiazide (HYDRODIURIL) 25 MG tablet Take 1 tablet (25 mg total) by mouth daily. 90 tablet 3   hyoscyamine (LEVSIN SL) 0.125 MG SL tablet Place 0.125 mg under the tongue every 4 (four) hours as needed.     levothyroxine (SYNTHROID) 137 MCG tablet TAKE 1 TABLET DAILY BEFORE BREAKFAST 90 tablet 3   losartan (COZAAR) 100 MG tablet Take 1 tablet (100 mg total) by mouth daily. 90 tablet 3   metFORMIN (GLUCOPHAGE) 500 MG tablet  Take 1 tablet (500 mg total) by mouth 2 (two) times daily with a meal. 180 tablet 3   omeprazole (PRILOSEC) 20 MG capsule Take 1 capsule (20 mg total) by mouth daily. 90 capsule 3   OneTouch Delica Lancets 95K MISC Check blood sugar twice a day and as directed. Dx E11.9 200 each 1   potassium chloride (KLOR-CON) 10 MEQ tablet Take 1 tablet (10 mEq total) by mouth daily. 90 tablet 3   pravastatin (PRAVACHOL) 20 MG tablet Take 1 tablet (20 mg total) by mouth daily. 90 tablet 3   propranolol ER (INDERAL LA) 120 MG 24 hr capsule Take 1 capsule (120 mg total) by mouth daily. 90 capsule 3   No current facility-administered medications for this visit.    Allergies  Allergen Reactions   Codeine Nausea And Vomiting    REACTION: nausea and vomiting   Lisinopril     Cough/throat symptoms    Latex Rash    Diagnoses:  F33.0 major depressive affective disorder, recurrent, mild   Plan of Care: The patient is a 71 year old Caucasian who was referred due to experiencing depressive based symptoms. The patient lives with her dog named Max. The patient meets criteria for a diagnosis of F33.0 major depressive affective disorder, recurrent, mild based  off of the following: rumination of negative thoughts, feeling down, feeling sad, social isolation, avoiding pleasurable activities, and low self-esteem. She denied suicidal and homicidal ideation.   The patient stated that she feels as though her depressive symptoms do not impede her ability to function. The patient stated that she wanted to talk with her daughter Caryl Pina) to see if she felt as though the patient would benefit from therapy. The patient stated that after speaking with her daughter then she would decide whether or not to follow up for counseling.   This psychologist makes the recommendation that the patient follow up with her daughter to see if her daughter believes she needs counseling. If daughter feels as though patient needs counseling than therapist is willing to work with the patient in therapy.    Conception Chancy, PsyD

## 2021-01-31 NOTE — Progress Notes (Signed)
                Eliani Leclere, PsyD 

## 2021-01-31 NOTE — Telephone Encounter (Signed)
Approval  received for Bupropion xl effective 01/28/2021 effective 01/15/2022.

## 2021-02-11 MED ORDER — PROPRANOLOL HCL ER 80 MG PO CP24: 80.0000 mg | ORAL_CAPSULE | Freq: Every day | ORAL | 3 refills | Status: DC

## 2021-02-28 ENCOUNTER — Telehealth: Payer: Self-pay

## 2021-02-28 NOTE — Telephone Encounter (Signed)
I spoke with pt; pt said symptoms started couple of weeks ago with S/T and dry cough. Now no S/T but pt continues with dry cough; pt said lungs sound terrible; pt can hear rattling in chest after coughing episode. Pt has head pressure not pain and pt has had wheezing in chest but has not heard wheezing last couple of nights. Pt said she does not have H/A,SOB or fever and no earache. Pt said her daughter wants her seen so someone can listen to her lungs. Pt had 2 covid test recently and the last home covid test was 02/23/21 that were both negative.Offered pt appt in AM at [facility] but pt has previous obligation tomorrow morning until at least 1:00 -1:30. I scheduled pt an appt at [place] 03/01/21 at 2:30 with UC & ED precautions given and pt voiced understanding. Pt said she does not think she needs to be seen today. Sendingnote to Dr Glori Bickers and [place] CMA.

## 2021-02-28 NOTE — Telephone Encounter (Signed)
Please triage if you can get a hold of her

## 2021-02-28 NOTE — Telephone Encounter (Signed)
Aware, will watch for correspondence  

## 2021-02-28 NOTE — Telephone Encounter (Signed)
[place] Day - Client TELEPHONE ADVICE RECORD [facility] Patient Name: Andrea Cooper Gender: Female DOB: [birth year] Age: 71 Y 2 M 7 D Return Phone Number: [phone] (Primary) Address: [address] City/ State/ Zip: [place]  [address] Client [place] Day - Client Client Site [place] Provider [place], Roque Lias - MD Contact Type Call Who Is Calling Patient / Member / Family / Caregiver Call Type Triage / Clinical Relationship To Patient Self Return Phone Number [phone] (Primary) Chief Complaint Nasal Congestion Reason for Call Symptomatic / Request for [place] states she has nasal and chest congested for over a week. Translation No Nurse Assessment Nurse: [place], RN, Estill Bamberg Date/Time (Eastern Time): 02/28/2021 12:26:15 PM Confirm and document reason for call. If symptomatic, describe symptoms. ---caller states she has chest/nasal congestion. has cough Does the patient have any new or worsening symptoms? ---Yes Will a triage be completed? ---Yes Related visit to physician within the last 2 weeks? ---No Does the PT have any chronic conditions? (i.e. diabetes, asthma, this includes High risk factors for pregnancy, etc.) ---Yes List chronic conditions. ---DM, HTN Is this a behavioral health or substance abuse call? ---No Guidelines Guideline Title Affirmed Question Affirmed Notes Nurse Date/Time (Eastern Time) Cough - Acute Productive Cough with cold symptoms (e.g., runny nose, postnasal drip, throat clearing) [place], RN, Estill Bamberg 02/28/2021 12:26:53 PM Disp. Time Eilene Ghazi Time) Disposition Final User 02/28/2021 12:12:18 PM Attempt made - message left [place], RN, Estill Bamberg 02/28/2021 12:33:14 PM Home Care Yes Humfleet, RN, Estill Bamberg PLEASE NOTE: All timestamps contained within this report are represented as [place] Standard  Time. CONFIDENTIALTY NOTICE: This fax transmission is intended only for the addressee. It contains information that is legally privileged, confidential or otherwise protected from use or disclosure. If you are not the intended recipient, you are strictly prohibited from reviewing, disclosing, copying using or disseminating any of this information or taking any action in reliance on or regarding this information. If you have received this fax in error, please notify us immediately by telephone so that we can arrange for its return to [place]. Phone: [phone], Toll-Free: [phone], Fax: [phone] Page: 2 of 2 Call Id: [id number] [place] Disagree/Comply Comply Caller Understands Yes PreDisposition Call a family member Care Advice Given Per Guideline * You should be able to treat this at home. * HOME REMEDY - HARD CANDY: Hard candy works just as well as over-thecounter cough drops. People who have diabetes should use sugar-free candy. CARE ADVICE given per Cough - Acute Productive (Adult) guideline. * You become worse CALL BACK IF: * Nasal discharge lasts over 10 days * Earache or facial pain develop

## 2021-02-28 NOTE — Telephone Encounter (Signed)
I was unable to speak with pt and left v/m requesting pt to cb. Sending note to Dr Glori Bickers to review access note and lsc triage and [place] CMA.

## 2021-03-01 ENCOUNTER — Other Ambulatory Visit: Payer: Self-pay

## 2021-03-01 ENCOUNTER — Ambulatory Visit
Admission: EM | Admit: 2021-03-01 | Discharge: 2021-03-01 | Disposition: A | Discharge status: Home / Self Care | Payer: Medicare Other

## 2021-03-01 ENCOUNTER — Inpatient Hospital Stay
Admission: RE | Admit: 2021-03-01 | Discharge: 2021-03-01 | Disposition: A | Discharge status: Home / Self Care | Payer: Self-pay | Source: Ambulatory Visit

## 2021-03-01 ENCOUNTER — Ambulatory Visit (INDEPENDENT_AMBULATORY_CARE_PROVIDER_SITE_OTHER): Payer: Medicare Other

## 2021-03-01 ENCOUNTER — Encounter: Payer: Self-pay | Admitting: Emergency Medicine

## 2021-03-01 DIAGNOSIS — R0989 Other specified symptoms and signs involving the circulatory and respiratory systems: Secondary | ICD-10-CM | POA: Diagnosis not present

## 2021-03-01 DIAGNOSIS — R059 Cough, unspecified: Secondary | ICD-10-CM | POA: Diagnosis not present

## 2021-03-01 DIAGNOSIS — R053 Chronic cough: Secondary | ICD-10-CM

## 2021-03-01 DIAGNOSIS — R062 Wheezing: Secondary | ICD-10-CM

## 2021-03-01 DIAGNOSIS — J209 Acute bronchitis, unspecified: Secondary | ICD-10-CM | POA: Diagnosis not present

## 2021-03-01 MED ORDER — PROMETHAZINE-DM 6.25-15 MG/5ML PO SYRP: 5.0000 mL | ORAL_SOLUTION | Freq: Every evening | ORAL | 0 refills | Status: DC | PRN

## 2021-03-01 MED ORDER — BENZONATATE 100 MG PO CAPS: 100.0000 mg | ORAL_CAPSULE | Freq: Three times a day (TID) | ORAL | 0 refills | Status: DC | PRN

## 2021-03-01 MED ORDER — PREDNISONE 20 MG PO TABS: ORAL_TABLET | ORAL | 0 refills | Status: DC

## 2021-03-01 MED ORDER — ALBUTEROL SULFATE HFA 108 (90 BASE) MCG/ACT IN AERS: 1.0000 | INHALATION_SPRAY | Freq: Four times a day (QID) | RESPIRATORY_TRACT | 0 refills | Status: DC | PRN

## 2021-03-01 NOTE — ED Triage Notes (Signed)
Pt presents with cough, chest congestion, and wheezing x 2 weeks.

## 2021-03-01 NOTE — ED Provider Notes (Signed)
Andrea Cooper   MRN: [id number] DOB: [birth year]  Subjective:   Andrea Cooper is a 71 y.o. female presenting for 2 week of persistent coughing, wheezing, chest congestion.  Initially symptoms started out with throat pain and postnasal drainage, sinus congestion.  The throat pain has resolved.  The sinus symptoms are improving but she still has this worst in the morning.  It does improve throughout the day.  No active chest pain.  No history of asthma.  Patient is a former smoker.   No current facility-administered medications for this encounter.  Current Outpatient Medications:    amLODipine (NORVASC) 10 MG tablet, Take 1 tablet (10 mg total) by mouth daily., Disp: 90 tablet, Rfl: 3   aspirin EC 81 MG tablet, Take 81 mg by mouth daily., Disp: , Rfl:    buPROPion (WELLBUTRIN XL) 300 MG 24 hr tablet, TAKE 1 TABLET DAILY, Disp: 90 tablet, Rfl: 1   calcium carbonate (CALCIUM 600) 600 MG TABS tablet, Take 1 tablet (600 mg total) by mouth 2 (two) times daily., Disp: 180 tablet, Rfl: 3   Cholecalciferol (VITAMIN D3) 50 MCG (2000 UT) capsule, Take 1 capsule (2,000 Units total) by mouth daily., Disp: 90 capsule, Rfl: 3   FLUoxetine (PROZAC) 40 MG capsule, Take 2 capsules (80 mg total) by mouth daily., Disp: 180 capsule, Rfl: 3   glucose blood (ONETOUCH ULTRA) test strip, USE 1 STRIP TO CHECK GLUCOSE ONCE DAILYH AS DIRECTED (daignosis code: E11.9), Disp: 100 each, Rfl: 1   Homeopathic Products (ZICAM COLD REMEDY PO), Take by mouth., Disp: , Rfl:    hydrochlorothiazide (HYDRODIURIL) 25 MG tablet, Take 1 tablet (25 mg total) by mouth daily., Disp: 90 tablet, Rfl: 3   hyoscyamine (LEVSIN SL) 0.125 MG SL tablet, Place 0.125 mg under the tongue every 4 (four) hours as needed., Disp: , Rfl:    levothyroxine (SYNTHROID) 137 MCG tablet, TAKE 1 TABLET DAILY BEFORE BREAKFAST, Disp: 90 tablet, Rfl: 3   losartan (COZAAR) 100 MG tablet, Take 1 tablet (100 mg total) by mouth daily., Disp: 90 tablet, Rfl:  3   metFORMIN (GLUCOPHAGE) 500 MG tablet, Take 1 tablet (500 mg total) by mouth 2 (two) times daily with a meal., Disp: 180 tablet, Rfl: 3   omeprazole (PRILOSEC) 20 MG capsule, Take 1 capsule (20 mg total) by mouth daily., Disp: 90 capsule, Rfl: 3   OneTouch Delica Lancets 93Y MISC, Check blood sugar twice a day and as directed. Dx E11.9, Disp: 200 each, Rfl: 1   potassium chloride (KLOR-CON) 10 MEQ tablet, Take 1 tablet (10 mEq total) by mouth daily., Disp: 90 tablet, Rfl: 3   potassium chloride (KLOR-CON) 10 MEQ tablet, Take 10 mEq by mouth daily., Disp: , Rfl:    pravastatin (PRAVACHOL) 20 MG tablet, Take 1 tablet (20 mg total) by mouth daily., Disp: 90 tablet, Rfl: 3   propranolol ER (INDERAL LA) 80 MG 24 hr capsule, Take 1 capsule (80 mg total) by mouth daily., Disp: 90 capsule, Rfl: 3   Allergies  Allergen Reactions   Codeine Nausea And Vomiting    REACTION: nausea and vomiting   Lisinopril     Cough/throat symptoms    Latex Rash    Past Medical History:  Diagnosis Date   Anxiety    Cataract 2019   corrected with surgery   Depression    Diabetes mellitus without complication (HCC)    GERD (gastroesophageal reflux disease)    History of anxiety    History of cerebral  aneurysm repair    history of cerebral aneurysm    History of depression    History of hyperglycemia    History of hyperlipidemia    History of hypothyroidism    History of melanoma    History of motion sickness    History of skin cancer    hx of melanoma left lower arm, recurrance   Hypercholesterolemia    Hypertension    Hypothyroidism    IBS (irritable bowel syndrome)    Nocturnal oxygen desaturation    PONV (postoperative nausea and vomiting)    Thyroid disease      Past Surgical History:  Procedure Laterality Date   ABDOMINAL HYSTERECTOMY     APPENDECTOMY  1966   BREAST BIOPSY Left 07-29-14    core bx FIBROCYSTIC CHANGES WITH FEW MICROCALCIFICATIONS.   cardiolite  12/1997   normal EF 68%    CATARACT EXTRACTION W/ INTRAOCULAR LENS  IMPLANT, BILATERAL Bilateral 04/2017   [place]  2008, 2009   coiling at [place] N/A 01/22/2018   Procedure: COLONOSCOPY WITH PROPOFOL;  Surgeon: Toledo, Benay Pike, MD;  Location: [facility] ENDOSCOPY;  Service: Gastroenterology;  Laterality: N/A;   EYE SURGERY     RR   FOOT SURGERY  02/1998   bilateral   INTRAOPERATIVE ARTERIOGRAM  10/17/2015   MOHS SURGERY  1992   TOE SURGERY Left 2015   TONSILLECTOMY  1964   TOTAL ABDOMINAL HYSTERECTOMY W/ BILATERAL SALPINGOOPHORECTOMY      Family History  Problem Relation Age of Onset   Diabetes Father    Depression Father    CAD Father    Diabetes Mother    Diverticulosis Mother    Adrenal disorder Mother    Breast cancer Other        distant relatives (all on mother's side)   Cerebral aneurysm Sister     Social History   Tobacco Use   Smoking status: Former   Smokeless tobacco: Never  Vaping Use   Vaping Use: Never used  Substance Use Topics   Alcohol use: Yes    Alcohol/week: 1.0 standard drink    Types: 1 Standard drinks or equivalent per week    Comment: occasional wine   Drug use: No    ROS   Objective:   Vitals: BP 130/83 (BP Location: Left Wrist)    Pulse 63    Temp 98.1 F (36.7 C) (Oral)    Resp 18    SpO2 94%   Physical Exam Constitutional:      General: She is not in acute distress.    Appearance: Normal appearance. She is well-developed. She is not ill-appearing, toxic-appearing or diaphoretic.  HENT:     Head: Normocephalic and atraumatic.     Nose: Nose normal.     Mouth/Throat:     Mouth: Mucous membranes are moist.  Eyes:     General: No scleral icterus.       Right eye: No discharge.        Left eye: No discharge.     Extraocular Movements: Extraocular movements intact.  Cardiovascular:     Rate and Rhythm: Normal rate.     Heart sounds: No murmur heard.   No friction rub. No gallop.  Pulmonary:      Effort: Pulmonary effort is normal. No respiratory distress.     Breath sounds: No stridor. Examination of the left-upper field reveals decreased breath sounds. Examination of the left-middle field  reveals decreased breath sounds. Decreased breath sounds present. No wheezing, rhonchi or rales.  Chest:     Chest wall: No tenderness.  Skin:    General: Skin is warm and dry.  Neurological:     General: No focal deficit present.     Mental Status: She is alert and oriented to person, place, and time.  Psychiatric:        Mood and Affect: Mood normal.        Behavior: Behavior normal.    DG Chest 2 View  Result Date: 03/01/2021 CLINICAL DATA:  Cough EXAM: CHEST - 2 VIEW COMPARISON:  08/05/2006 FINDINGS: Cardiac size is within normal limits. Thoracic aorta is tortuous and ectatic. There is peribronchial thickening. There is no focal pulmonary consolidation. There is no pleural effusion or pneumothorax. IMPRESSION: Peribronchial thickening suggests bronchitis. There is no focal pulmonary consolidation. There is no pleural effusion. Electronically Signed   By: Elmer Picker M.D.   On: 03/01/2021 15:01     Assessment and Plan :   PDMP not reviewed this encounter.  1. Acute bronchitis, unspecified organism   2. Persistent cough   3. Chest congestion   4. Wheezing    Counseled that bronchitis is primarily a viral infection.  Discussed antibiotic stewardship, will hold off on antibiotics for now.  Recommended an oral prednisone course, albuterol inhaler.  Use supportive care otherwise. Counseled patient on potential for adverse effects with medications prescribed/recommended today, ER and return-to-clinic precautions discussed, patient verbalized understanding.    Jaynee Eagles, [place] 03/01/21 [phone]

## 2021-03-04 DIAGNOSIS — M545 Low back pain, unspecified: Secondary | ICD-10-CM | POA: Diagnosis not present

## 2021-03-23 DIAGNOSIS — M2560 Stiffness of unspecified joint, not elsewhere classified: Secondary | ICD-10-CM | POA: Diagnosis not present

## 2021-03-23 DIAGNOSIS — M545 Low back pain, unspecified: Secondary | ICD-10-CM | POA: Diagnosis not present

## 2021-03-23 NOTE — Progress Notes (Signed)
Subjective:   Andrea Cooper is a 71 y.o. female who presents for Medicare Annual (Subsequent) preventive examination.  I connected with Andrea Cooper today by telephone and verified that I am speaking with the correct person using two identifiers. Location patient: home Location provider: work Persons participating in the virtual visit: patient, Marine scientist.    I discussed the limitations, risks, security and privacy concerns of performing an evaluation and management service by telephone and the availability of in person appointments. I also discussed with the patient that there may be a patient responsible charge related to this service. The patient expressed understanding and verbally consented to this telephonic visit.    Interactive audio and video telecommunications were attempted between this provider and patient, however failed, due to patient having technical difficulties OR patient did not have access to video capability.  We continued and completed visit with audio only.  Some vital signs may be absent or patient reported.   Time Spent with patient on telephone encounter: 25 minutes  Review of Systems     Cardiac Risk Factors include: advanced age (>23mn, >>64women);diabetes mellitus;hypertension;dyslipidemia     Objective:    Today's Vitals   03/24/21 0814  Weight: 200 lb (90.7 kg)  Height: '5\' 1"'$  (1.549 m)   Body mass index is 37.79 kg/m.  Advanced Directives 03/24/2021 03/23/2020 01/31/2019 01/25/2018 01/22/2018 12/14/2017 01/19/2017  Does Patient Have a Medical Advance Directive? No Yes Yes Yes Yes Yes No  Type of Advance Directive - [place] will [place] will [place] will - Living will -  Copy of [place]ain Chart? - No - copy requested No - copy requested No - copy requested - - -  Would patient like information on creating a medical advance directive? Yes  (MAU/Ambulatory/Procedural Areas - Information given) - - - - No - Patient declined No - Patient declined    Current Medications (verified) Outpatient Encounter Medications as of 03/24/2021  Medication Sig   albuterol (VENTOLIN HFA) 108 (90 Base) MCG/ACT inhaler Inhale 1-2 puffs into the lungs every 6 (six) hours as needed for wheezing or shortness of breath.   amLODipine (NORVASC) 10 MG tablet Take 1 tablet (10 mg total) by mouth daily.   amoxicillin (AMOXIL) 500 MG capsule Take 500 mg by mouth 3 (three) times daily.   aspirin EC 81 MG tablet Take 81 mg by mouth daily.   benzonatate (TESSALON) 100 MG capsule Take 1-2 capsules (100-200 mg total) by mouth 3 (three) times daily as needed for cough.   buPROPion (WELLBUTRIN XL) 300 MG 24 hr tablet TAKE 1 TABLET DAILY   calcium carbonate (CALCIUM 600) 600 MG TABS tablet Take 1 tablet (600 mg total) by mouth 2 (two) times daily.   Cholecalciferol (VITAMIN D3) 50 MCG (2000 UT) capsule Take 1 capsule (2,000 Units total) by mouth daily.   FLUoxetine (PROZAC) 40 MG capsule Take 2 capsules (80 mg total) by mouth daily.   glucose blood (ONETOUCH ULTRA) test strip USE 1 STRIP TO CHECK GLUCOSE ONCE DAILYH AS DIRECTED (daignosis code: E11.9)   Homeopathic Products (ZICAM COLD REMEDY PO) Take by mouth.   hydrochlorothiazide (HYDRODIURIL) 25 MG tablet Take 1 tablet (25 mg total) by mouth daily.   hyoscyamine (LEVSIN SL) 0.125 MG SL tablet Place 0.125 mg under the tongue every 4 (four) hours as needed.   levothyroxine (SYNTHROID) 137 MCG tablet TAKE 1 TABLET DAILY BEFORE BREAKFAST   losartan (COZAAR)  100 MG tablet Take 1 tablet (100 mg total) by mouth daily.   metFORMIN (GLUCOPHAGE) 500 MG tablet Take 1 tablet (500 mg total) by mouth 2 (two) times daily with a meal.   omeprazole (PRILOSEC) 20 MG capsule Take 1 capsule (20 mg total) by mouth daily.   OneTouch Delica Lancets 76L MISC Check blood sugar twice a day and as directed. Dx E11.9   potassium chloride  (KLOR-CON) 10 MEQ tablet Take 1 tablet (10 mEq total) by mouth daily.   potassium chloride (KLOR-CON) 10 MEQ tablet Take 10 mEq by mouth daily.   pravastatin (PRAVACHOL) 20 MG tablet Take 1 tablet (20 mg total) by mouth daily.   promethazine-dextromethorphan (PROMETHAZINE-DM) 6.25-15 MG/5ML syrup Take 5 mLs by mouth at bedtime as needed for cough.   propranolol ER (INDERAL LA) 80 MG 24 hr capsule Take 1 capsule (80 mg total) by mouth daily.   predniSONE (DELTASONE) 20 MG tablet Take 2 tablets daily with breakfast. (Patient not taking: Reported on 03/24/2021)   No facility-administered encounter medications on file as of 03/24/2021.    Allergies (verified) Codeine, Lisinopril, and Latex   History: Past Medical History:  Diagnosis Date   Anxiety    Cataract 2019   corrected with surgery   Depression    Diabetes mellitus without complication ([place])    GERD (gastroesophageal reflux disease)    History of anxiety    History of cerebral aneurysm repair    history of cerebral aneurysm    History of depression    History of hyperglycemia    History of hyperlipidemia    History of hypothyroidism    History of melanoma    History of motion sickness    History of skin cancer    hx of melanoma left lower arm, recurrance   Hypercholesterolemia    Hypertension    Hypothyroidism    IBS (irritable bowel syndrome)    Nocturnal oxygen desaturation    PONV (postoperative nausea and vomiting)    Thyroid disease    Past Surgical History:  Procedure Laterality Date   ABDOMINAL HYSTERECTOMY     APPENDECTOMY  1966   BREAST BIOPSY Left 07-29-14    core bx FIBROCYSTIC CHANGES WITH FEW MICROCALCIFICATIONS.   cardiolite  12/1997   normal EF 68%   CATARACT EXTRACTION W/ INTRAOCULAR LENS  IMPLANT, BILATERAL Bilateral 04/2017   [place]  2008, 2009   coiling at [place] N/A 01/22/2018   Procedure: COLONOSCOPY WITH PROPOFOL;  Surgeon: Toledo,  Benay Pike, MD;  Location: [facility] ENDOSCOPY;  Service: Gastroenterology;  Laterality: N/A;   EYE SURGERY     RR   FOOT SURGERY  02/1998   bilateral   INTRAOPERATIVE ARTERIOGRAM  10/17/2015   MOHS SURGERY  1992   TOE SURGERY Left 2015   TONSILLECTOMY  1964   TOTAL ABDOMINAL HYSTERECTOMY W/ BILATERAL SALPINGOOPHORECTOMY     Family History  Problem Relation Age of Onset   Diabetes Father    Depression Father    CAD Father    Diabetes Mother    Diverticulosis Mother    Adrenal disorder Mother    Breast cancer Other        distant relatives (all on mother's side)   Cerebral aneurysm Sister    Social History   Socioeconomic History   Marital status: Single    Spouse name: Not on file   Number of children: Not on file   Years  of education: Not on file   Highest education level: Not on file  Occupational History   Not on file  Tobacco Use   Smoking status: Former   Smokeless tobacco: Never  Vaping Use   Vaping Use: Never used  Substance and Sexual Activity   Alcohol use: Yes    Alcohol/week: 1.0 standard drink    Types: 1 Standard drinks or equivalent per week    Comment: occasional wine   Drug use: No   Sexual activity: Never  Other Topics Concern   Not on file  Social History Narrative   Not on file   Social Determinants of Health   Financial Resource Strain: Low Risk    Difficulty of Paying Living Expenses: Not hard at all  Food Insecurity: No Food Insecurity   Worried About Charity fundraiser in the Last Year: Never true   [place] in the Last Year: Never true  Transportation Needs: No Transportation Needs   Lack of Transportation (Medical): No   Lack of Transportation (Non-Medical): No  Physical Activity: Insufficiently Active   Days of Exercise per Week: 1 day   Minutes of Exercise per Session: 60 min  Stress: No Stress Concern Present   Feeling of Stress : Not at all  Social Connections: Moderately Integrated   Frequency of Communication with  Friends and Family: More than three times a week   Frequency of Social Gatherings with Friends and Family: Three times a week   Attends Religious Services: More than 4 times per year   Active Member of Clubs or Organizations: Yes   Attends Music therapist: More than 4 times per year   Marital Status: Separated    Tobacco Counseling Counseling given: Not Answered   Clinical Intake:  Pre-visit preparation completed: Yes  Pain : No/denies pain     BMI - recorded: 37.79 Nutritional Status: BMI > 30  Obese Nutritional Risks: None Diabetes: Yes CBG done?: No Did pt. bring in CBG monitor from home?: No  How often do you need to have someone help you when you read instructions, pamphlets, or other written materials from your doctor or pharmacy?: 1 - Never  Diabetes:  Is the patient diabetic?  Yes  If diabetic, was a CBG obtained today?  No  Did the patient bring in their glucometer from home?  No  How often do you monitor your CBG's? Every other day.   Financial Strains and Diabetes Management:  Are you having any financial strains with the device, your supplies or your medication? No .  Does the patient want to be seen by Chronic Care Management for management of their diabetes?  No  Would the patient like to be referred to a Nutritionist or for Diabetic Management?  No   Diabetic Exams:  Diabetic Eye Exam: Completed 08/09/20.  Diabetic Foot Exam: Completed 11/09/20.    Interpreter Needed?: No  Information entered by :: Orrin Brigham LPN   Activities of Daily Living In your present state of health, do you have any difficulty performing the following activities: 03/24/2021  Hearing? Y  Vision? N  Difficulty concentrating or making decisions? N  Walking or climbing stairs? N  Dressing or bathing? N  Doing errands, shopping? N  Preparing Food and eating ? N  Using the Toilet? N  In the past six months, have you accidently leaked urine? N  Do you have  problems with loss of bowel control? N  Managing your Medications? N  Managing your Finances? N  Housekeeping or managing your Housekeeping? N  Some recent data might be hidden    Patient Care Team: Tower, Wynelle Fanny, MD as PCP - General Kate Sable, MD as PCP - Cardiology (Cardiology) Bary Castilla, Forest Gleason, MD (General Surgery) Chucky May, MD as Consulting Physician (Psychiatry) Hoy Finlay, MD as Referring Physician (Neurosurgery) Christiane Ha as Consulting Physician (Optometry) Rolm Bookbinder, MD as Consulting Physician (Dermatology)  Indicate any recent Medical Services you may have received from other than [facility] providers in the past year (date may be approximate).     Assessment:   This is a routine wellness examination for [place].  Hearing/Vision screen Hearing Screening - Comments:: Patient states boarder line needing assisted device for hearing Vision Screening - Comments:: Last exam 08/09/20, [facility], wears glasses for reading  Dietary issues and exercise activities discussed: Current Exercise Habits: Structured exercise class, Type of exercise: Other - see comments (started PT), Time (Minutes): 60, Frequency (Times/Week): 1, Weekly Exercise (Minutes/Week): 60, Intensity: Mild, Exercise limited by: orthopedic condition(s)   Goals Addressed             This Visit's Progress    Patient Stated       Would like to continue eating healthier and drink more water       Depression Screen PHQ 2/9 Scores 03/24/2021 03/23/2020 01/31/2019 01/25/2018 01/19/2017 01/19/2016 05/24/2015  PHQ - 2 Score 0 2 0 2 0 0 0  PHQ- 9 Score - 2 0 9 0 - -    Fall Risk Fall Risk  03/24/2021 03/23/2020 01/31/2019 01/25/2018 01/19/2017  Falls in the past year? 0 0 0 1 No  Comment - - - Thanksgiving 2019 fell as a result of wet leaves on sidewalk; injury to face; treatment in ER -  Number falls in past yr: 0 0 0 0 -  Injury with Fall? 0 0 0 1 -  Risk Factor Category  - - - - -   Comment - - - - -  Risk for fall due to : - Medication side effect Medication side effect - -  Follow up Falls prevention discussed Falls evaluation completed;Falls prevention discussed Falls evaluation completed;Falls prevention discussed - -    FALL RISK PREVENTION PERTAINING TO THE HOME:  Any stairs in or around the home? Yes  If so, are there any without handrails? No  Home free of loose throw rugs in walkways, pet beds, electrical cords, etc? Yes  Adequate lighting in your home to reduce risk of falls? Yes   ASSISTIVE DEVICES UTILIZED TO PREVENT FALLS:  Life alert? No  Use of a cane, walker or w/c? No  Grab bars in the bathroom? No  Shower chair or bench in shower? Yes  Elevated toilet seat or a handicapped toilet? Yes   TIMED UP AND GO:  Was the test performed? No .    Cognitive Function: Normal cognitive status assessed by  this Nurse Health Advisor. No abnormalities found.   MMSE - Mini Mental State Exam 03/23/2020 01/31/2019 01/25/2018 01/19/2017 01/19/2016  Not completed: Refused - - - -  Orientation to time - '5 5 5 5  '$ Orientation to Place - '5 5 5 5  '$ Registration - '3 3 3 3  '$ Attention/ Calculation - 5 0 0 0  Recall - '3 3 3 3  '$ Language- name 2 objects - - 0 0 0  Language- repeat - '1 1 1 1  '$ Language- follow 3 step command - -  $'3 3 3  'Q$ Language- read & follow direction - - 0 0 0  Write a sentence - - 0 0 0  Copy design - - 0 0 0  Total score - - '20 20 20        '$ Immunizations Immunization History  Administered Date(s) Administered   Fluad Quad(high Dose 65+) 09/28/2018, 11/09/2020   Influenza Whole 01/13/2009   Influenza, High Dose Seasonal PF 11/06/2016, 10/24/2017, 09/09/2019   Influenza,inj,Quad PF,6+ Mos 10/07/2013, 12/21/2015   Moderna Covid-19 Vaccine Bivalent Booster 64yr & up 12/21/2020   PFIZER(Purple Top)SARS-COV-2 Vaccination 03/04/2019, 03/25/2019, 10/30/2019   Pneumococcal Conjugate-13 07/14/2014, 06/20/2017   Pneumococcal Polysaccharide-23  10/01/2008, 02/14/2016, 09/09/2019   Td 01/08/1997, 04/16/2005   Zoster Recombinat (Shingrix) 10/06/2018, 12/06/2018   Zoster, Live 12/15/2013    TDAP status: Due, Education has been provided regarding the importance of this vaccine. Advised may receive this vaccine at local pharmacy or Health Dept. Aware to provide a copy of the vaccination record if obtained from local pharmacy or Health Dept. Verbalized acceptance and understanding.  Flu Vaccine status: Up to date  Pneumococcal vaccine status: Up to date  Covid-19 vaccine status: Completed vaccines  Qualifies for Shingles Vaccine? Yes   Zostavax completed Yes   Shingrix Completed?: Yes  Screening Tests Health Maintenance  Topic Date Due   TETANUS/TDAP  04/15/2025 (Originally 04/17/2015)   HEMOGLOBIN A1C  05/10/2021   OPHTHALMOLOGY EXAM  08/09/2021   FOOT EXAM  11/09/2021   MAMMOGRAM  01/28/2022   COLONOSCOPY (Pts 45-476yrInsurance coverage will need to be confirmed)  01/23/2028   Pneumonia Vaccine 6511Years old  Completed   INFLUENZA VACCINE  Completed   DEXA SCAN  Completed   COVID-19 Vaccine  Completed   Hepatitis C Screening  Completed   Zoster Vaccines- Shingrix  Completed   HPV VACCINES  Aged Out    Health Maintenance  There are no preventive care reminders to display for this patient.   Colorectal cancer screening: Type of screening: Colonoscopy. Completed 01/22/18. Repeat every 10 years  Mammogram status: Completed 01/28/21. Repeat every year  Bone Density status: patient states no longer completing this screening  Lung Cancer Screening: (Low Dose CT Chest recommended if Age 73-80ears, 30 pack-year currently smoking OR have quit w/in 15years.) does not qualify.     Additional Screening:  Hepatitis C Screening: does qualify; Completed 01/19/16  Vision Screening: Recommended annual ophthalmology exams for early detection of glaucoma and other disorders of the eye. Is the patient up to date with their annual  eye exam?  Yes  Who is the provider or what is the name of the office in which the patient attends annual eye exams? [facility]   Dental Screening: Recommended annual dental exams for proper oral hygiene  Community Resource Referral / Chronic Care Management: CRR required this visit?  No   CCM required this visit?  No      Plan:     I have personally reviewed and noted the following in the patients chart:   Medical and social history Use of alcohol, tobacco or illicit drugs  Current medications and supplements including opioid prescriptions.  Functional ability and status Nutritional status Physical activity Advanced directives List of other physicians Hospitalizations, surgeries, and ER visits in previous 12 months Vitals Screenings to include cognitive, depression, and falls Referrals and appointments  In addition, I have reviewed and discussed with patient certain preventive protocols, quality metrics, and best practice recommendations. A written personalized care plan  for preventive services as well as general preventive health recommendations were provided to patient.   Due to this being a telephonic visit, the after visit summary with patients personalized plan was offered to patient via mail or my-chart.  Patient would like to access on my-chart.    Loma Messing, LPN   05/23/2516   Nurse Health Advisor  Nurse Notes: none

## 2021-03-24 ENCOUNTER — Ambulatory Visit (INDEPENDENT_AMBULATORY_CARE_PROVIDER_SITE_OTHER): Payer: Medicare Other

## 2021-03-24 VITALS — Ht 61.0 in | Wt 200.0 lb

## 2021-03-24 DIAGNOSIS — Z0000 Encounter for general adult medical examination without abnormal findings: Secondary | ICD-10-CM

## 2021-03-24 NOTE — Patient Instructions (Signed)
[name] , Thank you for taking time to complete your Medicare Wellness Visit. I appreciate your ongoing commitment to your health goals. Please review the following plan we discussed and let me know if I can assist you in the future.   Screening recommendations/referrals: Colonoscopy: up to date, completed 01/22/18, due 01/23/28 Mammogram: up to date , completed 01/28/21, due 01/28/22 Bone Density: per our conversation you no longer complete this screening ophthalmology/optometry visit: up to date  Recommended yearly dental visit for hygiene and checkup  Vaccinations: Influenza vaccine: up to date  Pneumococcal vaccine: up to date  Tdap vaccine: due 04/17/15 Shingles vaccine: up to date    Covid-19:up to date   Advanced directives: Please bring a copy of Living Will and/or [place] for your chart.   Conditions/risks identified: see problem list   Next appointment: Follow up in one year for your annual wellness visit 03/27/22 @ 8:15am, this will be a telephone visit   Preventive Care 65 Years and Older, Female Preventive care refers to lifestyle choices and visits with your health care provider that can promote health and wellness. What does preventive care include? A yearly physical exam. This is also called an annual well check. Dental exams once or twice a year. Routine eye exams. Ask your health care provider how often you should have your eyes checked. Personal lifestyle choices, including: Daily care of your teeth and gums. Regular physical activity. Eating a healthy diet. Avoiding tobacco and drug use. Limiting alcohol use. Practicing safe sex. Taking low-dose aspirin every day. Taking vitamin and mineral supplements as recommended by your health care provider. What happens during an annual well check? The services and screenings done by your health care provider during your annual well check will depend on your age, overall health, lifestyle risk  factors, and family history of disease. Counseling  Your health care provider may ask you questions about your: Alcohol use. Tobacco use. Drug use. Emotional well-being. Home and relationship well-being. Sexual activity. Eating habits. History of falls. Memory and ability to understand (cognition). Work and work Statistician. Reproductive health. Screening  You may have the following tests or measurements: Height, weight, and BMI. Blood pressure. Lipid and cholesterol levels. These may be checked every 5 years, or more frequently if you are over 55 years old. Skin check. Lung cancer screening. You may have this screening every year starting at age 105 if you have a 30-pack-year history of smoking and currently smoke or have quit within the past 15 years. Fecal occult blood test (FOBT) of the stool. You may have this test every year starting at age 76. Flexible sigmoidoscopy or colonoscopy. You may have a sigmoidoscopy every 5 years or a colonoscopy every 10 years starting at age 86. Hepatitis C blood test. Hepatitis B blood test. Sexually transmitted disease (STD) testing. Diabetes screening. This is done by checking your blood sugar (glucose) after you have not eaten for a while (fasting). You may have this done every 1-3 years. Bone density scan. This is done to screen for osteoporosis. You may have this done starting at age 65. Mammogram. This may be done every 1-2 years. Talk to your health care provider about how often you should have regular mammograms. Talk with your health care provider about your test results, treatment options, and if necessary, the need for more tests. Vaccines  Your health care provider may recommend certain vaccines, such as: Influenza vaccine. This is recommended every year. Tetanus, diphtheria, and acellular pertussis (  Tdap, Td) vaccine. You may need a Td booster every 10 years. Zoster vaccine. You may need this after age 14. Pneumococcal 13-valent  conjugate (PCV13) vaccine. One dose is recommended after age 18. Pneumococcal polysaccharide (PPSV23) vaccine. One dose is recommended after age 48. Talk to your health care provider about which screenings and vaccines you need and how often you need them. This information is not intended to replace advice given to you by your health care provider. Make sure you discuss any questions you have with your health care provider. Document Released: 01/29/2015 Document Revised: 09/22/2015 Document Reviewed: 11/03/2014 Elsevier Interactive Patient Education  2017 [place] Prevention in the Home Falls can cause injuries. They can happen to people of all ages. There are many things you can do to make your home safe and to help prevent falls. What can I do on the outside of my home? Regularly fix the edges of walkways and driveways and fix any cracks. Remove anything that might make you trip as you walk through a door, such as a raised step or threshold. Trim any bushes or trees on the path to your home. Use bright outdoor lighting. Clear any walking paths of anything that might make someone trip, such as rocks or tools. Regularly check to see if handrails are loose or broken. Make sure that both sides of any steps have handrails. Any raised decks and porches should have guardrails on the edges. Have any leaves, snow, or ice cleared regularly. Use sand or salt on walking paths during winter. Clean up any spills in your garage right away. This includes oil or grease spills. What can I do in the bathroom? Use night lights. Install grab bars by the toilet and in the tub and shower. Do not use towel bars as grab bars. Use non-skid mats or decals in the tub or shower. If you need to sit down in the shower, use a plastic, non-slip stool. Keep the floor dry. Clean up any water that spills on the floor as soon as it happens. Remove soap buildup in the tub or shower regularly. Attach bath mats  securely with double-sided non-slip rug tape. Do not have throw rugs and other things on the floor that can make you trip. What can I do in the bedroom? Use night lights. Make sure that you have a light by your bed that is easy to reach. Do not use any sheets or blankets that are too big for your bed. They should not hang down onto the floor. Have a firm chair that has side arms. You can use this for support while you get dressed. Do not have throw rugs and other things on the floor that can make you trip. What can I do in the kitchen? Clean up any spills right away. Avoid walking on wet floors. Keep items that you use a lot in easy-to-reach places. If you need to reach something above you, use a strong step stool that has a grab bar. Keep electrical cords out of the way. Do not use floor polish or wax that makes floors slippery. If you must use wax, use non-skid floor wax. Do not have throw rugs and other things on the floor that can make you trip. What can I do with my stairs? Do not leave any items on the stairs. Make sure that there are handrails on both sides of the stairs and use them. Fix handrails that are broken or loose. Make sure that handrails are  as long as the stairways. Check any carpeting to make sure that it is firmly attached to the stairs. Fix any carpet that is loose or worn. Avoid having throw rugs at the top or bottom of the stairs. If you do have throw rugs, attach them to the floor with carpet tape. Make sure that you have a light switch at the top of the stairs and the bottom of the stairs. If you do not have them, ask someone to add them for you. What else can I do to help prevent falls? Wear shoes that: Do not have high heels. Have rubber bottoms. Are comfortable and fit you well. Are closed at the toe. Do not wear sandals. If you use a stepladder: Make sure that it is fully opened. Do not climb a closed stepladder. Make sure that both sides of the stepladder  are locked into place. Ask someone to hold it for you, if possible. Clearly mark and make sure that you can see: Any grab bars or handrails. First and last steps. Where the edge of each step is. Use tools that help you move around (mobility aids) if they are needed. These include: Canes. Walkers. Scooters. Crutches. Turn on the lights when you go into a dark area. Replace any light bulbs as soon as they burn out. Set up your furniture so you have a clear path. Avoid moving your furniture around. If any of your floors are uneven, fix them. If there are any pets around you, be aware of where they are. Review your medicines with your doctor. Some medicines can make you feel dizzy. This can increase your chance of falling. Ask your doctor what other things that you can do to help prevent falls. This information is not intended to replace advice given to you by your health care provider. Make sure you discuss any questions you have with your health care provider. Document Released: 10/29/2008 Document Revised: 06/10/2015 Document Reviewed: 02/06/2014 Elsevier Interactive Patient Education  2017 [employer].

## 2021-04-12 ENCOUNTER — Ambulatory Visit (INDEPENDENT_AMBULATORY_CARE_PROVIDER_SITE_OTHER): Payer: Medicare Other | Admitting: Psychologist

## 2021-04-12 ENCOUNTER — Other Ambulatory Visit: Payer: Self-pay

## 2021-04-12 DIAGNOSIS — F330 Major depressive disorder, recurrent, mild: Secondary | ICD-10-CM | POA: Diagnosis not present

## 2021-04-12 NOTE — Progress Notes (Signed)
Welcome Counselor/Therapist Progress Note ? ?Patient ID: Andrea Cooper, MRN: [id number],   ? ?Date: 04/12/2021 ? ?Time Spent: 1:05 pm to 1:45 pm; total time: 40 minutes ? ? This session was held via in person. The patient consented to in-person therapy and was in the clinician's office. Limits of confidentiality were discussed with the patient.  ? ?Treatment Type: Individual Therapy ? ?Reported Symptoms: Depression ? ?Mental Status Exam: ?Appearance:  Well Groomed     ?Behavior: Appropriate  ?Motor: Normal  ?Speech/Language:  Clear and Coherent  ?Affect: Appropriate  ?Mood: normal  ?Thought process: normal  ?Thought content:   WNL  ?Sensory/Perceptual disturbances:   WNL  ?Orientation: oriented to person, place, and time/date  ?Attention: Good  ?Concentration: Good  ?Memory: WNL  ?Fund of knowledge:  Good  ?Insight:   Fair  ?Judgment:  Fair  ?Impulse Control: Good  ? ?Risk Assessment: ?Danger to Self:  No ?Self-injurious Behavior: No ?Danger to Others: No ?Duty to Warn:no ?Physical Aggression / Violence:No  ?Access to Firearms a concern: No  ?Gang Involvement:No  ? ?Subjective: Beginning the session, patient described herself as feeling depressed. After reviewing the treatment plan, patient began reflecting on conflict she has experienced related to two different situations with her sister Mickel Baas. She also talked about her daughter Caryl Pina. From there, she acknowledged lack of motivation to get out. Patient identified with the theme of socialization as being important to her. Patient explored and processed ways to increase socialization. She was agreeable to homework and following up. She denied suicidal and homicidal ideation.   ? ?Interventions:  Worked on developing a therapeutic relationship with the patient using active listening and reflective statements. Provided emotional support using empathy and validation. Reviewed the treatment plan. Reviewed events since the intake. Normalized and  validated expressed thoughts and emotions. Challenged some of the thoughts expressed. Explored the interpersonal dynamics between her and her sister. Validated concerns. Used socratic questions to assist the patient gain insight into self. Identified the theme of socialization. Processed ways that patient can increase socialization in her life. Assisted in problem solving. Normalized and validated patient's behaviors from childhood. Provided psychoeducation about learned behaviors. Provided empathic statements. Assigned homework. Assessed for suicidal and homicidal ideation.  ? ?Homework: Ask Caryl Pina to hold accountable to be more active; go to elder center and participate in activities ? ?Next Session: Review homework and emotional support ? ?Diagnosis: F33.0 major depressive affective disorder, recurrent, mild  ? ?Plan:  ? ?Goals ?Alleviate depressive symptoms ?Recognize, accept, and cope with depressive feelings ?Develop healthy thinking patterns ?Develop healthy interpersonal relationships ? ?Objectives target date for all objectives is 01/31/2022 ?Cooperate with a medication evaluation by a physician ?Verbalize an accurate understanding of depression ?Verbalize an understanding of the treatment ?Identify and replace thoughts that support depression ?Learn and implement behavioral strategies ?Verbalize an understanding and resolution of current interpersonal problems ?Learn and implement problem solving and decision making skills ?Learn and implement conflict resolution skills to resolve interpersonal problems ?Verbalize an understanding of healthy and unhealthy emotions verbalize insight into how past relationships may be influence current experiences with depression ?Use mindfulness and acceptance strategies and increase value based behavior  ?Increase hopeful statements about the future.  ?Interventions ?Consistent with treatment model, discuss how change in cognitive, behavioral, and interpersonal can help  client alleviate depression ?CBT ?Behavioral activation help the client explore the relationship, nature of the dispute,  ?Help the client develop new interpersonal skills and relationships ?Conduct Problem so living therapy ?Teach  conflict resolution skills ?Use a process-experiential approach ?Conduct TLDP ?Conduct ACT ?Evaluate need for psychotropic medication ?Monitor adherence to medication  ? ?The patient and clinician reviewed the treatment plan on 04/12/2021 ? ?Conception Chancy, PsyD ? ? ? ?

## 2021-04-12 NOTE — Progress Notes (Signed)
                Fonnie Crookshanks, PsyD 

## 2021-05-03 ENCOUNTER — Telehealth: Payer: Self-pay | Admitting: Family Medicine

## 2021-05-03 DIAGNOSIS — E039 Hypothyroidism, unspecified: Secondary | ICD-10-CM

## 2021-05-03 DIAGNOSIS — E1169 Type 2 diabetes mellitus with other specified complication: Secondary | ICD-10-CM

## 2021-05-03 DIAGNOSIS — I10 Essential (primary) hypertension: Secondary | ICD-10-CM

## 2021-05-03 DIAGNOSIS — E119 Type 2 diabetes mellitus without complications: Secondary | ICD-10-CM

## 2021-05-03 NOTE — Telephone Encounter (Signed)
-----   Message from Velna Hatchet, RT sent at 04/18/2021 10:21 AM EDT ----- ?Regarding: Lab orders for appt on Thursday, 05/05/2021 ?Patient is scheduled for cpx, please order future labs.  Thanks, Anda Kraft  ? ?

## 2021-05-05 ENCOUNTER — Other Ambulatory Visit (INDEPENDENT_AMBULATORY_CARE_PROVIDER_SITE_OTHER): Payer: Medicare Other

## 2021-05-05 DIAGNOSIS — I10 Essential (primary) hypertension: Secondary | ICD-10-CM

## 2021-05-05 DIAGNOSIS — E1169 Type 2 diabetes mellitus with other specified complication: Secondary | ICD-10-CM | POA: Diagnosis not present

## 2021-05-05 DIAGNOSIS — E039 Hypothyroidism, unspecified: Secondary | ICD-10-CM | POA: Diagnosis not present

## 2021-05-05 DIAGNOSIS — E785 Hyperlipidemia, unspecified: Secondary | ICD-10-CM | POA: Diagnosis not present

## 2021-05-05 DIAGNOSIS — E119 Type 2 diabetes mellitus without complications: Secondary | ICD-10-CM | POA: Diagnosis not present

## 2021-05-05 LAB — COMPREHENSIVE METABOLIC PANEL
ALT: 40 U/L — ABNORMAL HIGH (ref 0–35)
AST: 22 U/L (ref 0–37)
Albumin: 4.2 g/dL (ref 3.5–5.2)
Alkaline Phosphatase: 43 U/L (ref 39–117)
BUN: 24 mg/dL — ABNORMAL HIGH (ref 6–23)
CO2: 30 mEq/L (ref 19–32)
Calcium: 9.5 mg/dL (ref 8.4–10.5)
Chloride: 104 mEq/L (ref 96–112)
Creatinine, Ser: 0.83 mg/dL (ref 0.40–1.20)
GFR: 70.45 mL/min (ref >60.00)
Glucose, Bld: 175 mg/dL — ABNORMAL HIGH (ref 70–99)
Potassium: 4.7 mEq/L (ref 3.5–5.1)
Sodium: 142 mEq/L (ref 135–145)
Total Bilirubin: 0.6 mg/dL (ref 0.2–1.2)
Total Protein: 6.7 g/dL (ref 6.0–8.3)

## 2021-05-05 LAB — CBC WITH DIFFERENTIAL/PLATELET
Basophils Absolute: 0 10*3/uL (ref 0.0–0.1)
Basophils Relative: 0.8 % (ref 0.0–3.0)
Eosinophils Absolute: 0.1 10*3/uL (ref 0.0–0.7)
Eosinophils Relative: 1.2 % (ref 0.0–5.0)
HCT: 41.2 % (ref 36.0–46.0)
Hemoglobin: 13.7 g/dL (ref 12.0–15.0)
Lymphocytes Relative: 36.8 % (ref 12.0–46.0)
Lymphs Abs: 2.1 10*3/uL (ref 0.7–4.0)
MCHC: 33.3 g/dL (ref 30.0–36.0)
MCV: 90.2 fl (ref 78.0–100.0)
Monocytes Absolute: 0.5 10*3/uL (ref 0.1–1.0)
Monocytes Relative: 9.8 % (ref 3.0–12.0)
Neutro Abs: 2.9 10*3/uL (ref 1.4–7.7)
Neutrophils Relative %: 51.4 % (ref 43.0–77.0)
Platelets: 252 10*3/uL (ref 150.0–400.0)
RBC: 4.57 Mil/uL (ref 3.87–5.11)
RDW: 13.9 % (ref 11.5–15.5)
WBC: 5.6 10*3/uL (ref 4.0–10.5)

## 2021-05-05 LAB — MICROALBUMIN / CREATININE URINE RATIO
Creatinine,U: 191.1 mg/dL
Microalb Creat Ratio: 3.3 mg/g (ref 0.0–30.0)
Microalb, Ur: 6.4 mg/dL — ABNORMAL HIGH (ref 0.0–1.9)

## 2021-05-05 LAB — HEMOGLOBIN A1C: Hgb A1c MFr Bld: 6.4 % (ref 4.6–6.5)

## 2021-05-05 LAB — LIPID PANEL
Cholesterol: 178 mg/dL (ref 0–200)
HDL: 59.2 mg/dL (ref >39.00)
LDL Cholesterol: 101 mg/dL — ABNORMAL HIGH (ref 0–99)
NonHDL: 119.21
Total CHOL/HDL Ratio: 3
Triglycerides: 92 mg/dL (ref 0.0–149.0)
VLDL: 18.4 mg/dL (ref 0.0–40.0)

## 2021-05-05 LAB — TSH: TSH: 1.59 u[IU]/mL (ref 0.35–5.50)

## 2021-05-10 ENCOUNTER — Encounter: Payer: Medicare Other | Admitting: Family Medicine

## 2021-05-12 ENCOUNTER — Encounter: Payer: Self-pay | Admitting: Family Medicine

## 2021-05-12 ENCOUNTER — Ambulatory Visit (INDEPENDENT_AMBULATORY_CARE_PROVIDER_SITE_OTHER): Payer: Medicare Other | Admitting: Family Medicine

## 2021-05-12 VITALS — BP 124/70 | HR 94 | Temp 97.7°F | Ht 61.0 in | Wt 203.2 lb

## 2021-05-12 DIAGNOSIS — Z79899 Other long term (current) drug therapy: Secondary | ICD-10-CM | POA: Diagnosis not present

## 2021-05-12 DIAGNOSIS — K219 Gastro-esophageal reflux disease without esophagitis: Secondary | ICD-10-CM | POA: Diagnosis not present

## 2021-05-12 DIAGNOSIS — E1169 Type 2 diabetes mellitus with other specified complication: Secondary | ICD-10-CM | POA: Diagnosis not present

## 2021-05-12 DIAGNOSIS — Z6838 Body mass index (BMI) 38.0-38.9, adult: Secondary | ICD-10-CM

## 2021-05-12 DIAGNOSIS — I10 Essential (primary) hypertension: Secondary | ICD-10-CM | POA: Diagnosis not present

## 2021-05-12 DIAGNOSIS — E119 Type 2 diabetes mellitus without complications: Secondary | ICD-10-CM | POA: Diagnosis not present

## 2021-05-12 DIAGNOSIS — E2839 Other primary ovarian failure: Secondary | ICD-10-CM | POA: Diagnosis not present

## 2021-05-12 DIAGNOSIS — E039 Hypothyroidism, unspecified: Secondary | ICD-10-CM

## 2021-05-12 DIAGNOSIS — F418 Other specified anxiety disorders: Secondary | ICD-10-CM | POA: Diagnosis not present

## 2021-05-12 DIAGNOSIS — E785 Hyperlipidemia, unspecified: Secondary | ICD-10-CM | POA: Diagnosis not present

## 2021-05-12 MED ORDER — FLUOXETINE HCL 40 MG PO CAPS: 80.0000 mg | ORAL_CAPSULE | Freq: Every day | ORAL | 3 refills | Status: DC

## 2021-05-12 MED ORDER — LOSARTAN POTASSIUM 100 MG PO TABS: 100.0000 mg | ORAL_TABLET | Freq: Every day | ORAL | 3 refills | Status: DC

## 2021-05-12 MED ORDER — LEVOTHYROXINE SODIUM 137 MCG PO TABS: ORAL_TABLET | ORAL | 3 refills | Status: DC

## 2021-05-12 MED ORDER — AMLODIPINE BESYLATE 10 MG PO TABS: 10.0000 mg | ORAL_TABLET | Freq: Every day | ORAL | 3 refills | Status: DC

## 2021-05-12 MED ORDER — BUPROPION HCL ER (XL) 300 MG PO TB24: 300.0000 mg | ORAL_TABLET | Freq: Every day | ORAL | 3 refills | Status: DC

## 2021-05-12 MED ORDER — PRAVASTATIN SODIUM 20 MG PO TABS: 20.0000 mg | ORAL_TABLET | Freq: Every day | ORAL | 3 refills | Status: DC

## 2021-05-12 MED ORDER — OMEPRAZOLE 20 MG PO CPDR: 20.0000 mg | DELAYED_RELEASE_CAPSULE | Freq: Every day | ORAL | 3 refills | Status: DC

## 2021-05-12 MED ORDER — HYDROCHLOROTHIAZIDE 25 MG PO TABS: 25.0000 mg | ORAL_TABLET | Freq: Every day | ORAL | 3 refills | Status: DC

## 2021-05-12 NOTE — Assessment & Plan Note (Addendum)
Counseling is helpful and urged to continue it  ? ?Reviewed stressors/ coping techniques/symptoms/ support sources/ tx options and side effects in detail today ?Will keep taking fluoxetine 80 mg daily and wellbutrin xl 300 mg daily ?Strongly enc socialization/exercise and self care  ?

## 2021-05-12 NOTE — Patient Instructions (Addendum)
I would like you to continue counseling  ? ?Keep doing things for people ?Push yourself to be social and to exercise  ? ?Try to get 500 to 1000 mcg of vitamin B12 daily  ? ?Schedule your bone density test  ? ?Please call the location of your choice from the menu below to schedule your Mammogram and/or Bone Density appointment.   ? ?[place]  ? ?[facility]                ?      Phone:  [phone] ?[address]                               ?[place], [place] [address]                                                             ?Services: Traditional and 3D Mammogram, Bone Density  ? ?[facility]           ?      Phone: [phone] ?[address]                                                       ?[place], [place] [address]    ?Service: Bone Density ONLY  ? *this site does NOT perform mammograms ? ?[place]                       ? Phone:  [phone] ?[address]                                  ?[place], [place] [address]                                            ?Services:  3D Mammogram and Bone Density  ? ? ?[place] ? ?[place] [facility]   ?Phone:  [phone]   ?[place] [address]                                            ?Services: 3D Mammogram and Bone Density ? ?[place] [facility])  ?Phone:  [phone]   ?[address]  [address]                        ?[place],  [address]                                              ?Services:  3D Mammogram and Bone Density ? ?

## 2021-05-12 NOTE — Assessment & Plan Note (Signed)
Well controlled ?Metformin 500 mg bid ?Eye exam utd and next one planned ?Nl foot exam  ?microalbumin done (ace allergic) ?Discussed low glycemic diet  ? ?

## 2021-05-12 NOTE — Assessment & Plan Note (Signed)
Lab Results  ?Component Value Date  ? [id number] 03/25/2020  ? ?inst pt to take [phone] mcg daily of B12 otc ?

## 2021-05-12 NOTE — Progress Notes (Signed)
 "  Subjective:    Patient ID: Andrea Cooper, female    DOB: [birth year], 71 y.o.   MRN: [id number]  HPI Pt presents for annual f/u of chronic medical problems   Wt Readings from Last 3 Encounters:  05/12/21 203 lb 4 oz (92.2 kg)  03/24/21 200 lb (90.7 kg)  12/03/20 200 lb (90.7 kg)   38.40 kg/m  Doing ok overall   Saw orthopedics  Went to PT and got exercises and it helps   Not taking care of herself well  Ate healthy and walked for 30 days - then it stopped  Gets off track with stress   Physical aging changes bother her    Had amw in march/ reviewed Last dexa 03/2016-in the normal range  No falls  No fractures Taking ca and D  Imms utd  HTN bp is stable today  No cp or palpitations or headaches or edema  No side effects to medicines  BP Readings from Last 3 Encounters:  05/12/21 124/70  03/01/21 130/83  11/09/20 134/78    Losartan  100 mg daily  Amlodipie 10 mg  Hctz 25 mg  Propranolol  ER 120 mg   Lab Results  Component Value Date   CREATININE 0.83 05/05/2021   BUN 24 (H) 05/05/2021   NA 142 05/05/2021   K 4.7 05/05/2021   CL 104 05/05/2021   CO2 30 05/05/2021   Lab Results  Component Value Date   WBC 5.6 05/05/2021   HGB 13.7 05/05/2021   HCT 41.2 05/05/2021   MCV 90.2 05/05/2021   PLT 252.0 05/05/2021   Needs to drink more water   Pulse Readings from Last 3 Encounters:  05/12/21 94  03/01/21 63  12/03/20 80   GERD Omeprazole  20 mg daily  Lab Results  Component Value Date   VITAMINB12 276 03/25/2020     DM2 Lab Results  Component Value Date   HGBA1C 6.4 05/05/2021   Metformin  500 mg bid  Eye exam - up to date /coming up in next few weeks    Lab Results  Component Value Date   MICROALBUR 6.4 (H) 05/05/2021   MICROALBUR 9.1 (H) 01/25/2018   Lab Results  Component Value Date   ALT 40 (H) 05/05/2021   AST 22 05/05/2021   ALKPHOS 43 05/05/2021   BILITOT 0.6 05/05/2021   ALT is mildly elevated  Alcohol very rarely  Does  take a fair amt of tylenol   Hypothyroidism  Pt has no clinical changes No change in energy level/ hair or skin/ edema and no tremor Lab Results  Component Value Date   TSH 1.59 05/05/2021    Levothyroxine  137 mcg daily  Hyperlipidemia Lab Results  Component Value Date   CHOL 178 05/05/2021   CHOL 196 11/09/2020   CHOL 167 03/25/2020   Lab Results  Component Value Date   HDL 59.20 05/05/2021   HDL 62.10 11/09/2020   HDL 62.20 03/25/2020   Lab Results  Component Value Date   LDLCALC 101 (H) 05/05/2021   LDLCALC 113 (H) 11/09/2020   LDLCALC 88 03/25/2020   Lab Results  Component Value Date   TRIG 92.0 05/05/2021   TRIG 108.0 11/09/2020   TRIG 82.0 03/25/2020   Lab Results  Component Value Date   CHOLHDL 3 05/05/2021   CHOLHDL 3 11/09/2020   CHOLHDL 3 03/25/2020   Lab Results  Component Value Date   LDLDIRECT 160.7 01/14/2007   LDLDIRECT 202.7 08/01/2006   Pravastatin  20 mg daily  Depression /anxiety  Fluoxetine  80 mg daily  Seeing Dr Dianna for counseling/has been twice  Really liked him  Felt good to get things off her chest  Knows she needs to be more social   - is starting to do that /also doing things for people  She volunteers for hospice   Patient Active Problem List   Diagnosis Date Noted   Fever and chills 12/03/2020   Flu vaccine need 11/09/2020   Current use of proton pump inhibitor 03/23/2020   Allergic dermatitis 01/07/2018   Hearing loss 02/14/2016   Estrogen deficiency 02/14/2016   GERD (gastroesophageal reflux disease) 08/13/2015   Visit for screening mammogram 07/25/2014   Encounter for Medicare annual wellness exam 07/14/2014   Hypokalemia 12/15/2013   Encounter for screening mammogram for breast cancer 06/07/2011   SLEEP APNEA 09/13/2009   Class 2 obesity due to excess calories with body mass index (BMI) of 38.0 to 38.9 in adult 01/13/2009   SWEATING 07/15/2008   Diabetes type 2, controlled (HCC) 05/22/2007   TOBACCO USE, QUIT  05/22/2007   CEREBRAL ANEURYSM 08/09/2006   Hypothyroidism 07/25/2006   Hyperlipidemia associated with type 2 diabetes mellitus (HCC) 07/25/2006   ANXIETY 07/25/2006   Depression with anxiety 07/25/2006   Essential hypertension 07/25/2006   IBS 07/25/2006   Polyphagia 07/25/2006   SKIN CANCER, HX OF 07/25/2006   Past Medical History:  Diagnosis Date   Anxiety    Cataract 2019   corrected with surgery   Depression    Diabetes mellitus without complication (HCC)    GERD (gastroesophageal reflux disease)    History of anxiety    History of cerebral aneurysm repair    history of cerebral aneurysm    History of depression    History of hyperglycemia    History of hyperlipidemia    History of hypothyroidism    History of melanoma    History of motion sickness    History of skin cancer    hx of melanoma left lower arm, recurrance   Hypercholesterolemia    Hypertension    Hypothyroidism    IBS (irritable bowel syndrome)    Nocturnal oxygen desaturation    PONV (postoperative nausea and vomiting)    Thyroid  disease    Past Surgical History:  Procedure Laterality Date   ABDOMINAL HYSTERECTOMY     APPENDECTOMY  1966   BREAST BIOPSY Left 07-29-14    core bx FIBROCYSTIC CHANGES WITH FEW MICROCALCIFICATIONS.   cardiolite  12/1997   normal EF 68%   CATARACT EXTRACTION W/ INTRAOCULAR LENS  IMPLANT, BILATERAL Bilateral 04/2017   [facility]   CEREBRAL ANEURYSM REPAIR  2008, 2009   coiling at [facility]   COLONOSCOPY WITH PROPOFOL  N/A 01/22/2018   Procedure: COLONOSCOPY WITH PROPOFOL ;  Surgeon: Toledo, Ladell POUR, MD;  Location: [facility] ENDOSCOPY;  Service: Gastroenterology;  Laterality: N/A;   EYE SURGERY     RR   FOOT SURGERY  02/1998   bilateral   INTRAOPERATIVE ARTERIOGRAM  10/17/2015   MOHS SURGERY  1992   TOE SURGERY Left 2015   TONSILLECTOMY  1964   TOTAL ABDOMINAL HYSTERECTOMY W/ BILATERAL SALPINGOOPHORECTOMY     Social History   Tobacco Use   Smoking status: Former    Smokeless tobacco: Never  Vaping Use   Vaping Use: Never used  Substance Use Topics   Alcohol use: Yes    Alcohol/week: 1.0 standard drink    Types: 1 Standard drinks or equivalent per week    Comment:  occasional wine   Drug use: No   Family History  Problem Relation Age of Onset   Diabetes Father    Depression Father    CAD Father    Diabetes Mother    Diverticulosis Mother    Adrenal disorder Mother    Breast cancer Other        distant relatives (all on mother's side)   Cerebral aneurysm Sister    Allergies  Allergen Reactions   Codeine Nausea And Vomiting    REACTION: nausea and vomiting   Lisinopril      Cough/throat symptoms    Latex Rash   Current Outpatient Medications on File Prior to Visit  Medication Sig Dispense Refill   amLODipine  (NORVASC ) 10 MG tablet Take 1 tablet (10 mg total) by mouth daily. 90 tablet 3   aspirin EC 81 MG tablet Take 81 mg by mouth daily.     buPROPion  (WELLBUTRIN  XL) 300 MG 24 hr tablet TAKE 1 TABLET DAILY 90 tablet 1   calcium  carbonate (CALCIUM  600) 600 MG TABS tablet Take 1 tablet (600 mg total) by mouth 2 (two) times daily. 180 tablet 3   Cholecalciferol (VITAMIN D3) 50 MCG (2000 UT) capsule Take 1 capsule (2,000 Units total) by mouth daily. 90 capsule 3   FLUoxetine  (PROZAC ) 40 MG capsule Take 2 capsules (80 mg total) by mouth daily. 180 capsule 3   glucose blood (ONETOUCH ULTRA) test strip USE 1 STRIP TO CHECK GLUCOSE ONCE DAILYH AS DIRECTED (daignosis code: E11.9) 100 each 1   Homeopathic Products (ZICAM COLD REMEDY PO) Take by mouth.     hydrochlorothiazide  (HYDRODIURIL ) 25 MG tablet Take 1 tablet (25 mg total) by mouth daily. 90 tablet 3   hyoscyamine (LEVSIN SL) 0.125 MG SL tablet Place 0.125 mg under the tongue every 4 (four) hours as needed.     levothyroxine  (SYNTHROID ) 137 MCG tablet TAKE 1 TABLET DAILY BEFORE BREAKFAST 90 tablet 3   losartan  (COZAAR ) 100 MG tablet Take 1 tablet (100 mg total) by mouth daily. 90 tablet 3    metFORMIN  (GLUCOPHAGE ) 500 MG tablet Take 1 tablet (500 mg total) by mouth 2 (two) times daily with a meal. 180 tablet 3   omeprazole  (PRILOSEC) 20 MG capsule Take 1 capsule (20 mg total) by mouth daily. 90 capsule 3   OneTouch Delica Lancets 33G MISC Check blood sugar twice a day and as directed. Dx E11.9 200 each 1   potassium chloride  (KLOR-CON ) 10 MEQ tablet Take 1 tablet (10 mEq total) by mouth daily. 90 tablet 3   potassium chloride  (KLOR-CON ) 10 MEQ tablet Take 10 mEq by mouth daily.     pravastatin  (PRAVACHOL ) 20 MG tablet Take 1 tablet (20 mg total) by mouth daily. 90 tablet 3   promethazine -dextromethorphan (PROMETHAZINE -DM) 6.25-15 MG/5ML syrup Take 5 mLs by mouth at bedtime as needed for cough. 100 mL 0   propranolol  ER (INDERAL  LA) 120 MG 24 hr capsule Take 120 mg by mouth daily.     No current facility-administered medications on file prior to visit.    Review of Systems  Constitutional:  Positive for fatigue. Negative for activity change, appetite change, fever and unexpected weight change.  HENT:  Negative for congestion, ear pain, rhinorrhea, sinus pressure and sore throat.   Eyes:  Negative for pain, redness and visual disturbance.  Respiratory:  Negative for cough, shortness of breath and wheezing.   Cardiovascular:  Negative for chest pain and palpitations.  Gastrointestinal:  Negative for abdominal pain, blood  in stool, constipation and diarrhea.  Endocrine: Negative for polydipsia and polyuria.  Genitourinary:  Negative for dysuria, frequency and urgency.  Musculoskeletal:  Negative for arthralgias, back pain and myalgias.  Skin:  Negative for pallor and rash.  Allergic/Immunologic: Negative for environmental allergies.  Neurological:  Negative for dizziness, syncope and headaches.  Hematological:  Negative for adenopathy. Does not bruise/bleed easily.  Psychiatric/Behavioral:  Positive for dysphoric mood. Negative for decreased concentration. The patient is  nervous/anxious.       Objective:   Physical Exam Constitutional:      General: She is not in acute distress.    Appearance: Normal appearance. She is well-developed. She is obese. She is not ill-appearing or diaphoretic.  HENT:     Head: Normocephalic and atraumatic.     Right Ear: Tympanic membrane, ear canal and external ear normal.     Left Ear: Tympanic membrane, ear canal and external ear normal.     Nose: Nose normal. No congestion.     Mouth/Throat:     Mouth: Mucous membranes are moist.     Pharynx: Oropharynx is clear. No posterior oropharyngeal erythema.  Eyes:     General: No scleral icterus.    Extraocular Movements: Extraocular movements intact.     Conjunctiva/sclera: Conjunctivae normal.     Pupils: Pupils are equal, round, and reactive to light.  Neck:     Thyroid : No thyromegaly.     Vascular: No carotid bruit or JVD.  Cardiovascular:     Rate and Rhythm: Normal rate and regular rhythm.     Pulses: Normal pulses.     Heart sounds: Normal heart sounds.    No gallop.  Pulmonary:     Effort: Pulmonary effort is normal. No respiratory distress.     Breath sounds: Normal breath sounds. No wheezing.     Comments: Good air exch Chest:     Chest wall: No tenderness.  Abdominal:     General: Bowel sounds are normal. There is no distension or abdominal bruit.     Palpations: Abdomen is soft. There is no mass.     Tenderness: There is no abdominal tenderness.     Hernia: No hernia is present.  Genitourinary:    Comments: Breast exam: No mass, nodules, thickening, tenderness, bulging, retraction, inflamation, nipple discharge or skin changes noted.  No axillary or clavicular LA.     Musculoskeletal:        General: No tenderness. Normal range of motion.     Cervical back: Normal range of motion and neck supple. No rigidity. No muscular tenderness.     Right lower leg: No edema.     Left lower leg: No edema.     Comments: No kyphosis   Lymphadenopathy:      Cervical: No cervical adenopathy.  Skin:    General: Skin is warm and dry.     Coloration: Skin is not pale.     Findings: No erythema or rash.     Comments: Solar lentigines diffusely   Neurological:     Mental Status: She is alert. Mental status is at baseline.     Cranial Nerves: No cranial nerve deficit.     Motor: No abnormal muscle tone.     Coordination: Coordination normal.     Gait: Gait normal.     Deep Tendon Reflexes: Reflexes are normal and symmetric. Reflexes normal.  Psychiatric:        Mood and Affect: Mood normal.  Cognition and Memory: Cognition and memory normal.     Comments: Pleasant  Mood is good today          Assessment & Plan:   Problem List Items Addressed This Visit       Cardiovascular and Mediastinum   Essential hypertension - Primary    bp in fair control at this time  BP Readings from Last 1 Encounters:  05/12/21 124/70  No changes needed Most recent labs reviewed  Disc lifstyle change with low sodium diet and exercise  Plan to continue Losartan  100 mg daily  Amlodipine  10 mg  Hctz 25 mg  Propranolol  ER 120 mg       Relevant Medications   propranolol  ER (INDERAL  LA) 120 MG 24 hr capsule   hydrochlorothiazide  (HYDRODIURIL ) 25 MG tablet   pravastatin  (PRAVACHOL ) 20 MG tablet   amLODipine  (NORVASC ) 10 MG tablet   losartan  (COZAAR ) 100 MG tablet     Digestive   GERD (gastroesophageal reflux disease)    Controlled with diet and omeprazole  20 mg daily           Relevant Medications   omeprazole  (PRILOSEC) 20 MG capsule     Endocrine   Diabetes type 2, controlled (HCC)    Well controlled Metformin  500 mg bid Eye exam utd and next one planned Nl foot exam  microalbumin done (ace allergic) Discussed low glycemic diet         Relevant Medications   pravastatin  (PRAVACHOL ) 20 MG tablet   losartan  (COZAAR ) 100 MG tablet   Hyperlipidemia associated with type 2 diabetes mellitus (HCC)    Disc goals for lipids and  reasons to control them Rev last labs with pt Rev low sat fat diet in detail  Plan to continue pravastatin  20 mg daily       Relevant Medications   propranolol  ER (INDERAL  LA) 120 MG 24 hr capsule   hydrochlorothiazide  (HYDRODIURIL ) 25 MG tablet   pravastatin  (PRAVACHOL ) 20 MG tablet   amLODipine  (NORVASC ) 10 MG tablet   losartan  (COZAAR ) 100 MG tablet   Hypothyroidism    Hypothyroidism  Pt has no clinical changes No change in energy level/ hair or skin/ edema and no tremor Lab Results  Component Value Date   TSH 1.59 05/05/2021    Plan to continue levothyroxine  137 mcg daily       Relevant Medications   propranolol  ER (INDERAL  LA) 120 MG 24 hr capsule   levothyroxine  (SYNTHROID ) 137 MCG tablet     Other   Class 2 obesity due to excess calories with body mass index (BMI) of 38.0 to 38.9 in adult    Discussed how this problem influences overall health and the risks it imposes  Reviewed plan for weight loss with lower calorie diet (via better food choices and also portion control or program like weight watchers) and exercise building up to or more than 30 minutes 5 days per week including some aerobic activity   Still wrestles with boredom/emotional eating       Current use of proton pump inhibitor    Lab Results  Component Value Date   VITAMINB12 276 03/25/2020  inst pt to take [phone] mcg daily of B12 otc      Depression with anxiety    Counseling is helpful and urged to continue it   Reviewed stressors/ coping techniques/symptoms/ support sources/ tx options and side effects in detail today Will keep taking fluoxetine  80 mg daily and wellbutrin  xl 300 mg daily Strongly  enc socialization/exercise and self care        Relevant Medications   FLUoxetine  (PROZAC ) 40 MG capsule   buPROPion  (WELLBUTRIN  XL) 300 MG 24 hr tablet   Estrogen deficiency    Screening dexa ordered No falls or fx  Taking ca and D Enc exercise        Relevant Orders   DG Bone Density     "

## 2021-05-12 NOTE — Assessment & Plan Note (Signed)
bp in fair control at this time  ?BP Readings from Last 1 Encounters:  ?05/12/21 124/70  ? ?No changes needed ?Most recent labs reviewed  ?Disc lifstyle change with low sodium diet and exercise  ?Plan to continue ?Losartan 100 mg daily  ?Amlodipine 10 mg  ?Hctz 25 mg  ?Propranolol ER 120 mg  ?

## 2021-05-12 NOTE — Assessment & Plan Note (Signed)
Discussed how this problem influences overall health and the risks it imposes  ?Reviewed plan for weight loss with lower calorie diet (via better food choices and also portion control or program like weight watchers) and exercise building up to or more than 30 minutes 5 days per week including some aerobic activity  ? ?Still wrestles with boredom/emotional eating ?

## 2021-05-12 NOTE — Assessment & Plan Note (Signed)
Disc goals for lipids and reasons to control them ?Rev last labs with pt ?Rev low sat fat diet in detail ? ?Plan to continue pravastatin 20 mg daily  ?

## 2021-05-12 NOTE — Assessment & Plan Note (Signed)
Controlled with diet and omeprazole 20 mg daily  ? ? ? ?

## 2021-05-12 NOTE — Assessment & Plan Note (Signed)
Hypothyroidism  ?Pt has no clinical changes ?No change in energy level/ hair or skin/ edema and no tremor ?Lab Results  ?Component Value Date  ? TSH 1.59 05/05/2021  ?  Plan to continue levothyroxine 137 mcg daily  ?

## 2021-05-12 NOTE — Assessment & Plan Note (Signed)
Screening dexa ordered ?No falls or fx  ?Taking ca and D ?Enc exercise  ?

## 2021-05-14 ENCOUNTER — Other Ambulatory Visit: Payer: Self-pay | Admitting: Family Medicine

## 2021-05-16 ENCOUNTER — Ambulatory Visit: Payer: Medicare Other | Admitting: Psychologist

## 2021-05-24 ENCOUNTER — Ambulatory Visit: Payer: Medicare Other | Admitting: Family Medicine

## 2021-05-24 NOTE — Progress Notes (Deleted)
   Subjective:    Patient ID: Andrea Cooper, female    DOB: [birth year], 71 y.o.   MRN: [id number]  HPI Pt presents for f/u of chronic medical problems   Review of Systems     Objective:   Physical Exam        Assessment & Plan:

## 2021-05-27 ENCOUNTER — Ambulatory Visit (INDEPENDENT_AMBULATORY_CARE_PROVIDER_SITE_OTHER): Payer: Medicare Other | Admitting: Family Medicine

## 2021-05-27 ENCOUNTER — Encounter: Payer: Self-pay | Admitting: Family Medicine

## 2021-05-27 VITALS — BP 102/78 | HR 73 | Temp 97.6°F | Ht 61.0 in | Wt 204.0 lb

## 2021-05-27 DIAGNOSIS — Z6838 Body mass index (BMI) 38.0-38.9, adult: Secondary | ICD-10-CM

## 2021-05-27 DIAGNOSIS — E119 Type 2 diabetes mellitus without complications: Secondary | ICD-10-CM

## 2021-05-27 DIAGNOSIS — E039 Hypothyroidism, unspecified: Secondary | ICD-10-CM

## 2021-05-27 MED ORDER — SEMAGLUTIDE(0.25 OR 0.5MG/DOS) 2 MG/1.5ML ~~LOC~~ SOPN: 0.2500 mg | PEN_INJECTOR | SUBCUTANEOUS | 1 refills | Status: DC

## 2021-05-27 NOTE — Progress Notes (Signed)
? ?Subjective:  ? ? Patient ID: Andrea Cooper, female    DOB: Aug 31, 1950, 71 y.o.   MRN: [id number] ? ?HPI ?Pt presents for f/u of DM2 ? ?Wt Readings from Last 3 Encounters:  ?05/27/21 204 lb (92.5 kg)  ?05/12/21 203 lb 4 oz (92.2 kg)  ?03/24/21 200 lb (90.7 kg)  ? ?38.55 kg/m? ? ?Interested in GLP medication  ?Has read about them and thinks it would be a good option  ? ?Really wants to loose weight  ?Frustrating, cannot do as much  ? ?No h/o pancreatitis  ? ?Is hypothyroid ?No nodules  ? ?Has not looked into coverage  ? ? ?Lab Results  ?Component Value Date  ? HGBA1C 6.4 05/05/2021  ? ?Takes metformin 500 mg bid  ?Arb and statin  ? ?Glucose levels are usually 140s  ?Today 191  ? ? ?She eats wrong ?Goes long periods without eating and then starving and grabs whatever  ?Gets too busy and not hungry in am  ? ?In the past she lost 85 lb eating much better/ate on a regular schedule and drank plenty of water  ?It was a program and a clinic  ?Low sodium/ lean protein and vegetables  ?Clinic closed  ?Accountability was very important  ? ?No meetings for weight watchers close by  ? ?Interested in [place] program  ? ? ?Lab Results  ?Component Value Date  ? MICROALBUR 6.4 (H) 05/05/2021  ? MICROALBUR 9.1 (H) 01/25/2018  ? ?Patient Active Problem List  ? Diagnosis Date Noted  ? Current use of proton pump inhibitor 03/23/2020  ? Allergic dermatitis 01/07/2018  ? Hearing loss 02/14/2016  ? Estrogen deficiency 02/14/2016  ? GERD (gastroesophageal reflux disease) 08/13/2015  ? Encounter for Medicare annual wellness exam 07/14/2014  ? Hypokalemia 12/15/2013  ? Encounter for screening mammogram for breast cancer 06/07/2011  ? SLEEP APNEA 09/13/2009  ? Class 2 obesity due to excess calories with body mass index (BMI) of 38.0 to 38.9 in adult 01/13/2009  ? SWEATING 07/15/2008  ? Diabetes type 2, controlled ([place]) 05/22/2007  ? TOBACCO USE, QUIT 05/22/2007  ? CEREBRAL ANEURYSM 08/09/2006  ? Hypothyroidism 07/25/2006  ? Hyperlipidemia  associated with type 2 diabetes mellitus ([place]) 07/25/2006  ? ANXIETY 07/25/2006  ? Depression with anxiety 07/25/2006  ? Essential hypertension 07/25/2006  ? IBS 07/25/2006  ? Polyphagia 07/25/2006  ? SKIN CANCER, HX OF 07/25/2006  ? ?Past Medical History:  ?Diagnosis Date  ? Anxiety   ? Cataract 2019  ? corrected with surgery  ? Depression   ? Diabetes mellitus without complication ([place])   ? GERD (gastroesophageal reflux disease)   ? History of anxiety   ? History of cerebral aneurysm repair   ? history of cerebral aneurysm   ? History of depression   ? History of hyperglycemia   ? History of hyperlipidemia   ? History of hypothyroidism   ? History of melanoma   ? History of motion sickness   ? History of skin cancer   ? hx of melanoma left lower arm, recurrance  ? Hypercholesterolemia   ? Hypertension   ? Hypothyroidism   ? IBS (irritable bowel syndrome)   ? Nocturnal oxygen desaturation   ? PONV (postoperative nausea and vomiting)   ? Thyroid disease   ? ?Past Surgical History:  ?Procedure Laterality Date  ? ABDOMINAL HYSTERECTOMY    ? APPENDECTOMY  1966  ? BREAST BIOPSY Left 07-29-14  ?  core bx FIBROCYSTIC CHANGES WITH FEW MICROCALCIFICATIONS.  ?  cardiolite  12/1997  ? normal EF 68%  ? CATARACT EXTRACTION W/ INTRAOCULAR LENS  IMPLANT, BILATERAL Bilateral 04/2017  ? [place]  ? CEREBRAL ANEURYSM REPAIR  2008, 2009  ? coiling at [place]  ? COLONOSCOPY WITH PROPOFOL N/A 01/22/2018  ? Procedure: COLONOSCOPY WITH PROPOFOL;  Surgeon: Toledo, Benay Pike, MD;  Location: [facility] ENDOSCOPY;  Service: Gastroenterology;  Laterality: N/A;  ? EYE SURGERY    ? RR  ? FOOT SURGERY  02/1998  ? bilateral  ? INTRAOPERATIVE ARTERIOGRAM  10/17/2015  ? [place]  ? TOE SURGERY Left 2015  ? TONSILLECTOMY  1964  ? TOTAL ABDOMINAL HYSTERECTOMY W/ BILATERAL SALPINGOOPHORECTOMY    ? ?Social History  ? ?Tobacco Use  ? Smoking status: Former  ? Smokeless tobacco: Never  ?Vaping Use  ? Vaping Use: Never used  ?Substance Use Topics  ?  Alcohol use: Yes  ?  Alcohol/week: 1.0 standard drink  ?  Types: 1 Standard drinks or equivalent per week  ?  Comment: occasional wine  ? Drug use: No  ? ?Family History  ?Problem Relation Age of Onset  ? Diabetes Father   ? Depression Father   ? CAD Father   ? Diabetes Mother   ? Diverticulosis Mother   ? Adrenal disorder Mother   ? Breast cancer Other   ?     distant relatives (all on mother's side)  ? Cerebral aneurysm Sister   ? ?Allergies  ?Allergen Reactions  ? Codeine Nausea And Vomiting  ?  REACTION: nausea and vomiting  ? Lisinopril   ?  Cough/throat symptoms   ? Latex Rash  ? ?Current Outpatient Medications on File Prior to Visit  ?Medication Sig Dispense Refill  ? amLODipine (NORVASC) 10 MG tablet Take 1 tablet (10 mg total) by mouth daily. 90 tablet 3  ? aspirin EC 81 MG tablet Take 81 mg by mouth daily.    ? buPROPion (WELLBUTRIN XL) 300 MG 24 hr tablet Take 1 tablet (300 mg total) by mouth daily. 90 tablet 3  ? calcium carbonate (CALCIUM 600) 600 MG TABS tablet Take 1 tablet (600 mg total) by mouth 2 (two) times daily. 180 tablet 3  ? Cholecalciferol (VITAMIN D3) 50 MCG (2000 UT) capsule Take 1 capsule (2,000 Units total) by mouth daily. 90 capsule 3  ? FLUoxetine (PROZAC) 40 MG capsule Take 2 capsules (80 mg total) by mouth daily. 180 capsule 3  ? glucose blood (ONETOUCH ULTRA) test strip USE 1 STRIP TO CHECK GLUCOSE ONCE DAILYH AS DIRECTED (daignosis code: E11.9) 100 each 1  ? Homeopathic Products (ZICAM COLD REMEDY PO) Take by mouth.    ? hydrochlorothiazide (HYDRODIURIL) 25 MG tablet Take 1 tablet (25 mg total) by mouth daily. 90 tablet 3  ? hyoscyamine (LEVSIN SL) 0.125 MG SL tablet Place 0.125 mg under the tongue every 4 (four) hours as needed.    ? levothyroxine (SYNTHROID) 137 MCG tablet TAKE 1 TABLET DAILY BEFORE BREAKFAST 90 tablet 3  ? losartan (COZAAR) 100 MG tablet Take 1 tablet (100 mg total) by mouth daily. 90 tablet 3  ? metFORMIN (GLUCOPHAGE) 500 MG tablet TAKE 1 TABLET TWICE A DAY  WITH MEALS 180 tablet 3  ? omeprazole (PRILOSEC) 20 MG capsule Take 1 capsule (20 mg total) by mouth daily. 90 capsule 3  ? OneTouch Delica Lancets 29H MISC Check blood sugar twice a day and as directed. Dx E11.9 200 each 1  ? potassium chloride (KLOR-CON) 10 MEQ tablet  TAKE 1 TABLET DAILY 90 tablet 3  ? pravastatin (PRAVACHOL) 20 MG tablet Take 1 tablet (20 mg total) by mouth daily. 90 tablet 3  ? propranolol ER (INDERAL LA) 120 MG 24 hr capsule TAKE 1 CAPSULE DAILY 90 capsule 3  ? ?No current facility-administered medications on file prior to visit.  ?  ? ?Review of Systems  ?Constitutional:  Positive for fatigue. Negative for activity change, appetite change, fever and unexpected weight change.  ?HENT:  Negative for congestion, ear pain, rhinorrhea, sinus pressure and sore throat.   ?Eyes:  Negative for pain, redness and visual disturbance.  ?Respiratory:  Negative for cough, shortness of breath and wheezing.   ?Cardiovascular:  Negative for chest pain and palpitations.  ?Gastrointestinal:  Negative for abdominal pain, blood in stool, constipation and diarrhea.  ?Endocrine: Negative for polydipsia and polyuria.  ?Genitourinary:  Negative for dysuria, frequency and urgency.  ?Musculoskeletal:  Negative for arthralgias, back pain and myalgias.  ?Skin:  Negative for pallor and rash.  ?Allergic/Immunologic: Negative for environmental allergies.  ?Neurological:  Negative for dizziness, syncope and headaches.  ?Hematological:  Negative for adenopathy. Does not bruise/bleed easily.  ?Psychiatric/Behavioral:  Negative for decreased concentration and dysphoric mood. The patient is not nervous/anxious.   ? ?   ?Objective:  ? Physical Exam ?Constitutional:   ?   General: She is not in acute distress. ?   Appearance: Normal appearance. She is well-developed. She is obese. She is not ill-appearing.  ?HENT:  ?   Head: Normocephalic and atraumatic.  ?Eyes:  ?   Conjunctiva/sclera: Conjunctivae normal.  ?   Pupils: Pupils are  equal, round, and reactive to light.  ?Neck:  ?   Thyroid: No thyromegaly.  ?   Vascular: No carotid bruit or JVD.  ?Cardiovascular:  ?   Rate and Rhythm: Normal rate and regular rhythm.  ?   Heart sounds: Constance Holster

## 2021-05-27 NOTE — Patient Instructions (Addendum)
Check out the [facility] program  ?May be at the [facility]  ? ?Try to incorporate exercise as tolerated  ?Walking -start with 5 minutes ?Do your therapy exercise  ? ?Once you get the medicine semaglutide- start checking glucose twice daily and keep a log  ? ?Follow up about a month after starting medicine with your log  ? ?Keep taking metformin  ? ? ? ?

## 2021-05-29 NOTE — Assessment & Plan Note (Signed)
Lab Results  ?Component Value Date  ? HGBA1C 6.4 05/05/2021  ? ?Lab Results  ?Component Value Date  ? MICROALBUR 6.4 (H) 05/05/2021  ? MICROALBUR 9.1 (H) 01/25/2018  ? ?Wt loss would help more  ?Disc addn of GLP medication for diabetes and obesity  ?Disc option of GLP medication including possible side effects like GI intolerance and risk of thyroid and endocrine cancer, pancreatitis and gallstones, kidney problems and diabetic retinopathy ? ?Sent semaglutide in to phamrmacy to check on coverage ?Plans to work on lifestyle change  ?If effective may be able to cut metformin in the future  ? ? ?Plan to continue metformin 500 mg bid  ? ?

## 2021-05-29 NOTE — Assessment & Plan Note (Addendum)
Discussed how this problem influences overall health and the risks it imposes  ?Reviewed plan for weight loss with lower calorie diet (via better food choices and also portion control or program like weight watchers) and exercise building up to or more than 30 minutes 5 days per week including some aerobic activity  ? ?Interested in trial of GLP med for DM2-this may help wt loss (see A/P for DM2)  ? ?Accountability programs help but she has not local chapter of [place] ?Plans to look into the [facility]s program  ? ? ?

## 2021-06-21 ENCOUNTER — Encounter: Payer: Self-pay | Admitting: Family Medicine

## 2021-06-21 ENCOUNTER — Ambulatory Visit (INDEPENDENT_AMBULATORY_CARE_PROVIDER_SITE_OTHER): Payer: Medicare Other | Admitting: Family Medicine

## 2021-06-21 VITALS — BP 114/76 | HR 78 | Ht 61.0 in | Wt 194.0 lb

## 2021-06-21 DIAGNOSIS — Z6836 Body mass index (BMI) 36.0-36.9, adult: Secondary | ICD-10-CM | POA: Diagnosis not present

## 2021-06-21 DIAGNOSIS — E119 Type 2 diabetes mellitus without complications: Secondary | ICD-10-CM

## 2021-06-21 MED ORDER — SEMAGLUTIDE(0.25 OR 0.5MG/DOS) 2 MG/1.5ML ~~LOC~~ SOPN
0.5000 mg | PEN_INJECTOR | SUBCUTANEOUS | 2 refills | Status: DC
Start: 2021-06-21 — End: 2021-08-08

## 2021-06-21 NOTE — Assessment & Plan Note (Signed)
10 lb wt loss so far with semaglutide  Plan to inc to 0.5 mg dose  Tolerating well (does have constipation) and will try miralax and stool softeners

## 2021-06-21 NOTE — Assessment & Plan Note (Signed)
Some improvement with addn of semaglutide 0.25 mg weekly  Wt down 10 lb  Better diet  Metformin 500 mg bid  Glucose levels improving but not low   Plan to inc to 0.5 mg weekly (will update after a month)  Will watch for worsening constipation  Continue good habits and add some exercise  Taking arb and statin  F/u late July for visit and a1c

## 2021-06-21 NOTE — Progress Notes (Signed)
Subjective:    Patient ID: Andrea Cooper, female    DOB: [birth year], 71 y.o.   MRN: [id number]  HPI Pt presents for f/u of DM2  Wt Readings from Last 3 Encounters:  06/21/21 194 lb (88 kg)  05/27/21 204 lb (92.5 kg)  05/12/21 203 lb 4 oz (92.2 kg)   36.66 kg/m  Down 10 lb ! 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   Enjoying grand kids    DM2 Lab Results  Component Value Date   HGBA1C 6.4 05/05/2021   Metformin 500 mg bid  Arb and statin   Started semaglutide last visit in hopes of wt loss also Taking 0.25 mg weekly   Tolerating medicine well overall but some constipation  Using stool softeners   Blood sugars have been good /dropping  Nothing below 100 130s-150s most of the time   A little walking  Wants to do more  Some stairs for exercise   Lab Results  Component Value Date   CREATININE 0.83 05/05/2021   BUN 24 (H) 05/05/2021   NA 142 05/05/2021   K 4.7 05/05/2021   CL 104 05/05/2021   CO2 30 05/05/2021   Eye exam is due next month    Patient Active Problem List   Diagnosis Date Noted   Current use of proton pump inhibitor 03/23/2020   Allergic dermatitis 01/07/2018   Hearing loss 02/14/2016   Estrogen deficiency 02/14/2016   GERD (gastroesophageal reflux disease) 08/13/2015   Encounter for Medicare annual wellness exam 07/14/2014   Hypokalemia 12/15/2013   Encounter for screening mammogram for breast cancer 06/07/2011   SLEEP APNEA 09/13/2009   Class 2 severe obesity with serious comorbidity and body mass index (BMI) of 36.0 to 36.9 in adult ([place]) 01/13/2009   SWEATING 07/15/2008   Diabetes type 2, controlled ([place]) 05/22/2007   TOBACCO USE, QUIT 05/22/2007   CEREBRAL ANEURYSM 08/09/2006   Hypothyroidism 07/25/2006   Hyperlipidemia associated with type 2 diabetes mellitus ([place]) 07/25/2006   ANXIETY 07/25/2006   Depression with anxiety 07/25/2006   Essential hypertension  07/25/2006   IBS 07/25/2006   Polyphagia 07/25/2006   SKIN CANCER, HX OF 07/25/2006   Past Medical History:  Diagnosis Date   Anxiety    Cataract 2019   corrected with surgery   Depression    Diabetes mellitus without complication ([facility])    GERD (gastroesophageal reflux disease)    History of anxiety    History of cerebral aneurysm repair    history of cerebral aneurysm    History of depression    History of hyperglycemia    History of hyperlipidemia    History of hypothyroidism    History of melanoma    History of motion sickness    History of skin cancer    hx of melanoma left lower arm, recurrance   Hypercholesterolemia    Hypertension    Hypothyroidism    IBS (irritable bowel syndrome)    Nocturnal oxygen desaturation    PONV (postoperative nausea and vomiting)    Thyroid disease    Past Surgical History:  Procedure Laterality Date   ABDOMINAL HYSTERECTOMY     APPENDECTOMY  1966   BREAST BIOPSY Left 07-29-14    core bx FIBROCYSTIC CHANGES WITH FEW MICROCALCIFICATIONS.   cardiolite  12/1997   normal EF 68%   CATARACT EXTRACTION W/ INTRAOCULAR LENS  IMPLANT, BILATERAL Bilateral 04/2017  [place]  2008, 2009   coiling at [place] PROPOFOL N/A 01/22/2018   Procedure: COLONOSCOPY WITH PROPOFOL;  Surgeon: Toledo, Benay Pike, MD;  Location: [facility] ENDOSCOPY;  Service: Gastroenterology;  Laterality: N/A;   EYE SURGERY     RR   FOOT SURGERY  02/1998   bilateral   INTRAOPERATIVE ARTERIOGRAM  10/17/2015   MOHS SURGERY  1992   TOE SURGERY Left 2015   TONSILLECTOMY  1964   TOTAL ABDOMINAL HYSTERECTOMY W/ BILATERAL SALPINGOOPHORECTOMY     Social History   Tobacco Use   Smoking status: Former   Smokeless tobacco: Never  Vaping Use   Vaping Use: Never used  Substance Use Topics   Alcohol use: Yes    Alcohol/week: 1.0 standard drink    Types: 1 Standard drinks or equivalent per week    Comment: occasional wine   Drug  use: No   Family History  Problem Relation Age of Onset   Diabetes Father    Depression Father    CAD Father    Diabetes Mother    Diverticulosis Mother    Adrenal disorder Mother    Breast cancer Other        distant relatives (all on mother's side)   Cerebral aneurysm Sister    Allergies  Allergen Reactions   Codeine Nausea And Vomiting    REACTION: nausea and vomiting   Lisinopril     Cough/throat symptoms    Latex Rash   Current Outpatient Medications on File Prior to Visit  Medication Sig Dispense Refill   amLODipine (NORVASC) 10 MG tablet Take 1 tablet (10 mg total) by mouth daily. 90 tablet 3   aspirin EC 81 MG tablet Take 81 mg by mouth daily.     buPROPion (WELLBUTRIN XL) 300 MG 24 hr tablet Take 1 tablet (300 mg total) by mouth daily. 90 tablet 3   calcium carbonate (CALCIUM 600) 600 MG TABS tablet Take 1 tablet (600 mg total) by mouth 2 (two) times daily. 180 tablet 3   Cholecalciferol (VITAMIN D3) 50 MCG (2000 UT) capsule Take 1 capsule (2,000 Units total) by mouth daily. 90 capsule 3   FLUoxetine (PROZAC) 40 MG capsule Take 2 capsules (80 mg total) by mouth daily. 180 capsule 3   glucose blood (ONETOUCH ULTRA) test strip USE 1 STRIP TO CHECK GLUCOSE ONCE DAILYH AS DIRECTED (daignosis code: E11.9) 100 each 1   Homeopathic Products (ZICAM COLD REMEDY PO) Take by mouth.     hydrochlorothiazide (HYDRODIURIL) 25 MG tablet Take 1 tablet (25 mg total) by mouth daily. 90 tablet 3   hyoscyamine (LEVSIN SL) 0.125 MG SL tablet Place 0.125 mg under the tongue every 4 (four) hours as needed.     levothyroxine (SYNTHROID) 137 MCG tablet TAKE 1 TABLET DAILY BEFORE BREAKFAST 90 tablet 3   losartan (COZAAR) 100 MG tablet Take 1 tablet (100 mg total) by mouth daily. 90 tablet 3   metFORMIN (GLUCOPHAGE) 500 MG tablet TAKE 1 TABLET TWICE A DAY WITH MEALS 180 tablet 3   omeprazole (PRILOSEC) 20 MG capsule Take 1 capsule (20 mg total) by mouth daily. 90 capsule 3   OneTouch Delica  Lancets 47W MISC Check blood sugar twice a day and as directed. Dx E11.9 200 each 1   potassium chloride (KLOR-CON) 10 MEQ tablet TAKE 1 TABLET DAILY 90 tablet 3   pravastatin (PRAVACHOL) 20 MG tablet Take 1 tablet (20 mg total) by mouth  daily. 90 tablet 3   propranolol ER (INDERAL LA) 120 MG 24 hr capsule TAKE 1 CAPSULE DAILY 90 capsule 3   No current facility-administered medications on file prior to visit.    Review of Systems  Constitutional:  Negative for activity change, appetite change, fatigue, fever and unexpected weight change.  HENT:  Negative for congestion, ear pain, rhinorrhea, sinus pressure and sore throat.   Eyes:  Negative for pain, redness and visual disturbance.  Respiratory:  Negative for cough, shortness of breath and wheezing.   Cardiovascular:  Negative for chest pain and palpitations.  Gastrointestinal:  Positive for constipation. Negative for abdominal pain, blood in stool and diarrhea.  Endocrine: Negative for polydipsia and polyuria.  Genitourinary:  Negative for dysuria, frequency and urgency.  Musculoskeletal:  Negative for arthralgias, back pain and myalgias.  Skin:  Negative for pallor and rash.  Allergic/Immunologic: Negative for environmental allergies.  Neurological:  Negative for dizziness, syncope and headaches.  Hematological:  Negative for adenopathy. Does not bruise/bleed easily.  Psychiatric/Behavioral:  Negative for decreased concentration and dysphoric mood. The patient is not nervous/anxious.       Objective:   Physical Exam Constitutional:      General: She is not in acute distress.    Appearance: Normal appearance. She is well-developed. She is obese. She is not ill-appearing or diaphoretic.  HENT:     Head: Normocephalic and atraumatic.     Mouth/Throat:     Mouth: Mucous membranes are moist.  Eyes:     Conjunctiva/sclera: Conjunctivae normal.     Pupils: Pupils are equal, round, and reactive to light.  Neck:     Thyroid: No  thyromegaly.     Vascular: No carotid bruit or JVD.  Cardiovascular:     Rate and Rhythm: Normal rate and regular rhythm.     Heart sounds: Normal heart sounds.    No gallop.  Pulmonary:     Effort: Pulmonary effort is normal. No respiratory distress.     Breath sounds: Normal breath sounds. No wheezing or rales.  Abdominal:     General: There is no distension or abdominal bruit.     Palpations: Abdomen is soft.  Musculoskeletal:     Cervical back: Normal range of motion and neck supple.     Right lower leg: No edema.     Left lower leg: No edema.  Lymphadenopathy:     Cervical: No cervical adenopathy.  Skin:    General: Skin is warm and dry.     Coloration: Skin is not pale.     Findings: No rash.  Neurological:     Mental Status: She is alert.     Coordination: Coordination normal.     Deep Tendon Reflexes: Reflexes are normal and symmetric. Reflexes normal.  Psychiatric:        Mood and Affect: Mood normal.          Assessment & Plan:   Problem List Items Addressed This Visit       Endocrine   Diabetes type 2, controlled ([place]) - Primary    Some improvement with addn of semaglutide 0.25 mg weekly  Wt down 10 lb  Better diet  Metformin 500 mg bid  Glucose levels improving but not low   Plan to inc to 0.5 mg weekly (will update after a month)  Will watch for worsening constipation  Continue good habits and add some exercise  Taking arb and statin  F/u late July for visit and a1c  Relevant Medications   Semaglutide,0.25 or 0.'5MG'$ /DOS, 2 MG/1.5ML SOPN     Other   Class 2 severe obesity with serious comorbidity and body mass index (BMI) of 36.0 to 36.9 in adult ([facility])    10 lb wt loss so far with semaglutide  Plan to inc to 0.5 mg dose  Tolerating well (does have constipation) and will try miralax and stool softeners        Relevant Medications   Semaglutide,0.25 or 0.'5MG'$ /DOS, 2 MG/1.5ML SOPN

## 2021-06-21 NOTE — Patient Instructions (Addendum)
Miralax over the counter is helpful and safe for constipation  Stool softeners are useful also  Eat lots of fiber and drink fluids   Add exercise when you can   Take care of yourself   Keep up the good work   Let me know how you are after about a month   Follow up in late July

## 2021-07-11 ENCOUNTER — Encounter: Payer: Self-pay | Admitting: Family Medicine

## 2021-08-08 ENCOUNTER — Ambulatory Visit (INDEPENDENT_AMBULATORY_CARE_PROVIDER_SITE_OTHER): Payer: Medicare Other | Admitting: Family Medicine

## 2021-08-08 ENCOUNTER — Encounter: Payer: Self-pay | Admitting: Family Medicine

## 2021-08-08 VITALS — BP 128/68 | HR 76 | Temp 97.6°F | Ht 61.0 in | Wt 181.2 lb

## 2021-08-08 DIAGNOSIS — I10 Essential (primary) hypertension: Secondary | ICD-10-CM

## 2021-08-08 DIAGNOSIS — E6609 Other obesity due to excess calories: Secondary | ICD-10-CM | POA: Diagnosis not present

## 2021-08-08 DIAGNOSIS — E119 Type 2 diabetes mellitus without complications: Secondary | ICD-10-CM | POA: Diagnosis not present

## 2021-08-08 DIAGNOSIS — E785 Hyperlipidemia, unspecified: Secondary | ICD-10-CM | POA: Diagnosis not present

## 2021-08-08 DIAGNOSIS — Z6834 Body mass index (BMI) 34.0-34.9, adult: Secondary | ICD-10-CM

## 2021-08-08 DIAGNOSIS — E1169 Type 2 diabetes mellitus with other specified complication: Secondary | ICD-10-CM

## 2021-08-08 LAB — POCT GLYCOSYLATED HEMOGLOBIN (HGB A1C): Hemoglobin A1C: 5.8 % — AB (ref 4.0–5.6)

## 2021-08-08 MED ORDER — SEMAGLUTIDE (1 MG/DOSE) 4 MG/3ML ~~LOC~~ SOPN: 1.0000 mg | PEN_INJECTOR | SUBCUTANEOUS | 3 refills | Status: DC

## 2021-08-08 NOTE — Patient Instructions (Addendum)
Check into the senior activity center for exercise (until you can get back outdoors safely)   Start checking some glucose readings later in the day 2 hours after a meal   Go up on ozempic to 1 mg weekly  If side effects/not tolerated let us know   A1c is down to 5.8  Keep up the good work!

## 2021-08-08 NOTE — Assessment & Plan Note (Signed)
Discussed how this problem influences overall health and the risks it imposes  Reviewed plan for weight loss with lower calorie diet (via better food choices and also portion control or program like weight watchers) and exercise building up to or more than 30 minutes 5 days per week including some aerobic activity   Thrilled with wt loss not down to 181  semaglutide is helpful and she is eating better Discussed going to the senior center for indoor exercise during the summer  Commended

## 2021-08-08 NOTE — Assessment & Plan Note (Signed)
Disc goals for lipids and reasons to control them Rev last labs with pt Rev low sat fat diet in detail LDL of 101 Pravastatin 20 mg  With better diet hope that there will be improvement in 3 mo

## 2021-08-08 NOTE — Assessment & Plan Note (Signed)
bp in fair control at this time  BP Readings from Last 1 Encounters:  08/08/21 128/68   No changes needed Most recent labs reviewed  Disc lifstyle change with low sodium diet and exercise  Plan to continue Losartan 100 mg daily  Amlodipine 10 mg daily  Hctz 25 mg daily  Propranolol ER 120 mg daily

## 2021-08-08 NOTE — Progress Notes (Signed)
Subjective:    Patient ID: Andrea Cooper, female    DOB: July 22, 1950, 71 y.o.   MRN: [id number]  HPI Pt presents for f/u of DM2 and chronic medical problems  Wt Readings from Last 3 Encounters:  08/08/21 181 lb 4 oz (82.2 kg)  06/21/21 194 lb (88 kg)  05/27/21 204 lb (92.5 kg)   34.25 kg/m  Has been feeling good  Weight is coming down and she is thrilled   Miralax has helped her constipation   Last covid vaccine was in December     DM2 Lab Results  Component Value Date   HGBA1C 6.4 05/05/2021   Metformin 500 mg bid Ozempic 0.5 mg weekly   Lab Results  Component Value Date   HGBA1C 5.8 (A) 08/08/2021    Lab Results  Component Value Date   MICROALBUR 6.4 (H) 05/05/2021   MICROALBUR 9.1 (H) 01/25/2018     Feels like she is eating enough  (gave her constipation but miralax helps) Walking for exercise (now too hot)   Glucose runs 120-135 usually   Cutting carbs more than fat overall     HTN bp is stable today  No cp or palpitations or headaches or edema  No side effects to medicines  BP Readings from Last 3 Encounters:  08/08/21 128/68  06/21/21 114/76  05/27/21 102/78     Pulse Readings from Last 3 Encounters:  08/08/21 76  06/21/21 78  05/27/21 73   Losartan 100 mg daily  Amlodipine 10 mg daily  Hctz 25 mg daily  Propranolol ER 120 mg daily   Lab Results  Component Value Date   CREATININE 0.83 05/05/2021   BUN 24 (H) 05/05/2021   NA 142 05/05/2021   K 4.7 05/05/2021   CL 104 05/05/2021   CO2 30 05/05/2021   Hyperlipidemia Lab Results  Component Value Date   CHOL 178 05/05/2021   HDL 59.20 05/05/2021   LDLCALC 101 (H) 05/05/2021   LDLDIRECT 160.7 01/14/2007   TRIG 92.0 05/05/2021   CHOLHDL 3 05/05/2021   Pravastatin 20 mg daily   Hist   Review of Systems  Constitutional:  Negative for activity change, appetite change, fatigue, fever and unexpected weight change.  HENT:  Negative for congestion, ear pain, rhinorrhea, sinus  pressure and sore throat.   Eyes:  Negative for pain, redness and visual disturbance.  Respiratory:  Negative for cough, shortness of breath and wheezing.   Cardiovascular:  Negative for chest pain and palpitations.  Gastrointestinal:  Negative for abdominal pain, blood in stool, constipation and diarrhea.  Endocrine: Negative for polydipsia and polyuria.  Genitourinary:  Negative for dysuria, frequency and urgency.  Musculoskeletal:  Positive for arthralgias. Negative for back pain and myalgias.  Skin:  Negative for pallor and rash.  Allergic/Immunologic: Negative for environmental allergies.  Neurological:  Negative for dizziness, syncope and headaches.  Hematological:  Negative for adenopathy. Does not bruise/bleed easily.  Psychiatric/Behavioral:  Negative for decreased concentration and dysphoric mood. The patient is not nervous/anxious.        Objective:   Physical Exam Constitutional:      General: She is not in acute distress.    Appearance: Normal appearance. She is well-developed. She is obese.  HENT:     Head: Normocephalic and atraumatic.  Eyes:     Conjunctiva/sclera: Conjunctivae normal.     Pupils: Pupils are equal, round, and reactive to light.  Neck:     Thyroid: No thyromegaly.     Vascular:  No carotid bruit or JVD.  Cardiovascular:     Rate and Rhythm: Normal rate and regular rhythm.     Heart sounds: Normal heart sounds.     No gallop.  Pulmonary:     Effort: Pulmonary effort is normal. No respiratory distress.     Breath sounds: Normal breath sounds. No wheezing or rales.  Abdominal:     General: There is no distension or abdominal bruit.     Palpations: Abdomen is soft.  Musculoskeletal:     Cervical back: Normal range of motion and neck supple.     Right lower leg: No edema.     Left lower leg: No edema.  Lymphadenopathy:     Cervical: No cervical adenopathy.  Skin:    General: Skin is warm and dry.     Coloration: Skin is not pale.     Findings:  No rash.  Neurological:     Mental Status: She is alert.     Coordination: Coordination normal.     Deep Tendon Reflexes: Reflexes are normal and symmetric. Reflexes normal.  Psychiatric:        Mood and Affect: Mood normal.           Assessment & Plan:   Problem List Items Addressed This Visit       Cardiovascular and Mediastinum   Essential hypertension    bp in fair control at this time  BP Readings from Last 1 Encounters:  08/08/21 128/68  No changes needed Most recent labs reviewed  Disc lifstyle change with low sodium diet and exercise  Plan to continue Losartan 100 mg daily  Amlodipine 10 mg daily  Hctz 25 mg daily  Propranolol ER 120 mg daily       Relevant Orders   Comprehensive metabolic panel     Endocrine   Diabetes type 2, controlled ([place]) - Primary    Lab Results  Component Value Date   HGBA1C 5.8 (A) 08/08/2021  This continues to improve with wt loss and ozempic  Increase to 1 mg weekly if tolerated Continue metformin 500 mg bid  Eye exam utd Good foot care  Lab Results  Component Value Date   MICROALBUR 6.4 (H) 05/05/2021   MICROALBUR 9.1 (H) 01/25/2018          Relevant Medications   Semaglutide, 1 MG/DOSE, 4 MG/3ML SOPN   Other Relevant Orders   POCT glycosylated hemoglobin (Hb A1C) (Completed)   Hemoglobin A1c   Hyperlipidemia associated with type 2 diabetes mellitus (HCC)    Disc goals for lipids and reasons to control them Rev last labs with pt Rev low sat fat diet in detail LDL of 101 Pravastatin 20 mg  With better diet hope that there will be improvement in 3 mo      Relevant Medications   Semaglutide, 1 MG/DOSE, 4 MG/3ML SOPN   Other Relevant Orders   Lipid panel     Other   Class 1 obesity due to excess calories with serious comorbidity and body mass index (BMI) of 34.0 to 34.9 in adult    Discussed how this problem influences overall health and the risks it imposes  Reviewed plan for weight loss with lower calorie  diet (via better food choices and also portion control or program like weight watchers) and exercise building up to or more than 30 minutes 5 days per week including some aerobic activity   Thrilled with wt loss not down to 181  semaglutide is helpful and  she is eating better Discussed going to the senior center for indoor exercise during the summer  Commended       Relevant Medications   Semaglutide, 1 MG/DOSE, 4 MG/3ML SOPN

## 2021-08-08 NOTE — Assessment & Plan Note (Signed)
Lab Results  Component Value Date   HGBA1C 5.8 (A) 08/08/2021   This continues to improve with wt loss and ozempic  Increase to 1 mg weekly if tolerated Continue metformin 500 mg bid  Eye exam utd Good foot care  Lab Results  Component Value Date   MICROALBUR 6.4 (H) 05/05/2021   MICROALBUR 9.1 (H) 01/25/2018

## 2021-08-09 ENCOUNTER — Telehealth: Payer: Self-pay | Admitting: Family Medicine

## 2021-08-09 ENCOUNTER — Other Ambulatory Visit: Payer: Self-pay

## 2021-08-09 MED ORDER — SEMAGLUTIDE (1 MG/DOSE) 4 MG/3ML ~~LOC~~ SOPN: 1.0000 mg | PEN_INJECTOR | SUBCUTANEOUS | 3 refills | Status: DC

## 2021-08-09 NOTE — Telephone Encounter (Signed)
Patient called and stated the medication Ozempic is on back order at [facility], [address]  [place], [place] [address], and wanted to know if it can be sent to [facility] on [address]. Call back [phone]

## 2021-08-09 NOTE — Telephone Encounter (Signed)
Called and lvm  to let pt know that we have  sent medication to [facility]

## 2021-08-11 ENCOUNTER — Ambulatory Visit
Admission: RE | Admit: 2021-08-11 | Discharge: 2021-08-11 | Disposition: A | Discharge status: Home / Self Care | Payer: Medicare Other | Source: Ambulatory Visit | Attending: Family Medicine | Admitting: Family Medicine

## 2021-08-11 DIAGNOSIS — E2839 Other primary ovarian failure: Secondary | ICD-10-CM | POA: Insufficient documentation

## 2021-08-11 DIAGNOSIS — Z780 Asymptomatic menopausal state: Secondary | ICD-10-CM | POA: Diagnosis not present

## 2021-08-18 ENCOUNTER — Encounter: Payer: Self-pay | Admitting: Family Medicine

## 2021-08-18 DIAGNOSIS — E119 Type 2 diabetes mellitus without complications: Secondary | ICD-10-CM

## 2021-08-19 MED ORDER — SEMAGLUTIDE(0.25 OR 0.5MG/DOS) 2 MG/3ML ~~LOC~~ SOPN: 0.5000 mg | PEN_INJECTOR | SUBCUTANEOUS | 3 refills | Status: DC

## 2021-08-19 NOTE — Assessment & Plan Note (Signed)
Intol of 1 mg semaglutide Reduced dose to 0.5 mg again

## 2021-09-06 ENCOUNTER — Encounter: Payer: Self-pay | Admitting: Family Medicine

## 2021-09-07 DIAGNOSIS — Z8679 Personal history of other diseases of the circulatory system: Secondary | ICD-10-CM | POA: Diagnosis not present

## 2021-09-07 DIAGNOSIS — R0902 Hypoxemia: Secondary | ICD-10-CM | POA: Diagnosis not present

## 2021-09-07 DIAGNOSIS — Z79899 Other long term (current) drug therapy: Secondary | ICD-10-CM | POA: Diagnosis not present

## 2021-09-07 DIAGNOSIS — R112 Nausea with vomiting, unspecified: Secondary | ICD-10-CM | POA: Diagnosis not present

## 2021-09-07 DIAGNOSIS — Z5181 Encounter for therapeutic drug level monitoring: Secondary | ICD-10-CM | POA: Diagnosis not present

## 2021-09-07 DIAGNOSIS — R110 Nausea: Secondary | ICD-10-CM | POA: Diagnosis not present

## 2021-09-07 DIAGNOSIS — R519 Headache, unspecified: Secondary | ICD-10-CM | POA: Diagnosis not present

## 2021-09-07 DIAGNOSIS — Z87891 Personal history of nicotine dependence: Secondary | ICD-10-CM | POA: Diagnosis not present

## 2021-09-07 DIAGNOSIS — Z9049 Acquired absence of other specified parts of digestive tract: Secondary | ICD-10-CM | POA: Diagnosis not present

## 2021-09-07 DIAGNOSIS — G4489 Other headache syndrome: Secondary | ICD-10-CM | POA: Diagnosis not present

## 2021-09-07 DIAGNOSIS — D72829 Elevated white blood cell count, unspecified: Secondary | ICD-10-CM | POA: Diagnosis not present

## 2021-09-07 DIAGNOSIS — E870 Hyperosmolality and hypernatremia: Secondary | ICD-10-CM | POA: Diagnosis not present

## 2021-09-15 DIAGNOSIS — E119 Type 2 diabetes mellitus without complications: Secondary | ICD-10-CM | POA: Diagnosis not present

## 2021-09-15 DIAGNOSIS — Z961 Presence of intraocular lens: Secondary | ICD-10-CM | POA: Diagnosis not present

## 2021-09-15 LAB — HM DIABETES EYE EXAM

## 2021-10-25 ENCOUNTER — Other Ambulatory Visit (INDEPENDENT_AMBULATORY_CARE_PROVIDER_SITE_OTHER): Payer: Medicare Other

## 2021-10-25 DIAGNOSIS — E1169 Type 2 diabetes mellitus with other specified complication: Secondary | ICD-10-CM

## 2021-10-25 DIAGNOSIS — E119 Type 2 diabetes mellitus without complications: Secondary | ICD-10-CM | POA: Diagnosis not present

## 2021-10-25 DIAGNOSIS — I10 Essential (primary) hypertension: Secondary | ICD-10-CM | POA: Diagnosis not present

## 2021-10-25 DIAGNOSIS — E785 Hyperlipidemia, unspecified: Secondary | ICD-10-CM | POA: Diagnosis not present

## 2021-10-25 LAB — COMPREHENSIVE METABOLIC PANEL
ALT: 24 U/L (ref 0–35)
AST: 16 U/L (ref 0–37)
Albumin: 4.3 g/dL (ref 3.5–5.2)
Alkaline Phosphatase: 37 U/L — ABNORMAL LOW (ref 39–117)
BUN: 27 mg/dL — ABNORMAL HIGH (ref 6–23)
CO2: 30 mEq/L (ref 19–32)
Calcium: 10.5 mg/dL (ref 8.4–10.5)
Chloride: 101 mEq/L (ref 96–112)
Creatinine, Ser: 1 mg/dL (ref 0.40–1.20)
GFR: 56.15 mL/min — ABNORMAL LOW (ref >60.00)
Glucose, Bld: 119 mg/dL — ABNORMAL HIGH (ref 70–99)
Potassium: 4.6 mEq/L (ref 3.5–5.1)
Sodium: 143 mEq/L (ref 135–145)
Total Bilirubin: 0.5 mg/dL (ref 0.2–1.2)
Total Protein: 7.3 g/dL (ref 6.0–8.3)

## 2021-10-25 LAB — LIPID PANEL
Cholesterol: 152 mg/dL (ref 0–200)
HDL: 54.1 mg/dL (ref >39.00)
LDL Cholesterol: 77 mg/dL (ref 0–99)
NonHDL: 97.67
Total CHOL/HDL Ratio: 3
Triglycerides: 103 mg/dL (ref 0.0–149.0)
VLDL: 20.6 mg/dL (ref 0.0–40.0)

## 2021-10-25 LAB — HEMOGLOBIN A1C: Hgb A1c MFr Bld: 5.2 % (ref 4.6–6.5)

## 2021-11-01 ENCOUNTER — Other Ambulatory Visit: Payer: Medicare Other

## 2021-11-08 ENCOUNTER — Ambulatory Visit (INDEPENDENT_AMBULATORY_CARE_PROVIDER_SITE_OTHER): Payer: Medicare Other | Admitting: Family Medicine

## 2021-11-08 ENCOUNTER — Encounter: Payer: Self-pay | Admitting: Family Medicine

## 2021-11-08 VITALS — BP 116/68 | HR 72 | Temp 97.3°F | Ht 61.0 in | Wt 170.4 lb

## 2021-11-08 DIAGNOSIS — E6609 Other obesity due to excess calories: Secondary | ICD-10-CM

## 2021-11-08 DIAGNOSIS — E1169 Type 2 diabetes mellitus with other specified complication: Secondary | ICD-10-CM

## 2021-11-08 DIAGNOSIS — E119 Type 2 diabetes mellitus without complications: Secondary | ICD-10-CM

## 2021-11-08 DIAGNOSIS — E785 Hyperlipidemia, unspecified: Secondary | ICD-10-CM | POA: Diagnosis not present

## 2021-11-08 DIAGNOSIS — Z79899 Other long term (current) drug therapy: Secondary | ICD-10-CM | POA: Diagnosis not present

## 2021-11-08 DIAGNOSIS — Z6832 Body mass index (BMI) 32.0-32.9, adult: Secondary | ICD-10-CM

## 2021-11-08 DIAGNOSIS — E039 Hypothyroidism, unspecified: Secondary | ICD-10-CM

## 2021-11-08 DIAGNOSIS — I10 Essential (primary) hypertension: Secondary | ICD-10-CM

## 2021-11-08 NOTE — Assessment & Plan Note (Signed)
bp in fair control at this time  BP Readings from Last 1 Encounters:  11/08/21 116/68   No changes needed Most recent labs reviewed  Disc lifstyle change with low sodium diet and exercise  Losartan 100 mg daily  Amlodipine 10 mg daily  Hctz 25 mg daily  Propranolol ER 120 mg daily

## 2021-11-08 NOTE — Assessment & Plan Note (Signed)
Disc goals for lipids and reasons to control them Rev last labs with pt Rev low sat fat diet in detail Improved LDL of 77 with pravastatin 20 mg daily  Tolerates this Diet is improved Commended

## 2021-11-08 NOTE — Progress Notes (Signed)
Subjective:    Patient ID: Andrea Cooper, female    DOB: 1950/11/16, 71 y.o.   MRN: [id number]  HPI Pt presents for f/u od DM2, HTN and chronic health problems   Wt Readings from Last 3 Encounters:  11/08/21 170 lb 6 oz (77.3 kg)  08/08/21 181 lb 4 oz (82.2 kg)  06/21/21 194 lb (88 kg)   32.19 kg/m  Busy- going to soccer games for grand child- that is fun   Was walking and it bothered her hip Doing PT exercise    HTN bp is stable today  No cp or palpitations or headaches or edema  No side effects to medicines  BP Readings from Last 3 Encounters:  11/08/21 116/68  08/08/21 128/68  06/21/21 114/76    Pulse Readings from Last 3 Encounters:  11/08/21 72  08/08/21 76  06/21/21 78   Losartan 100 mg daily  Amlodipine 10 mg daily  Hctz 25 mg daily  Propranolol ER 120 mg daily   Lab Results  Component Value Date   CREATININE 1.00 10/25/2021   BUN 27 (H) 10/25/2021   NA 143 10/25/2021   K 4.6 10/25/2021   CL 101 10/25/2021   CO2 30 10/25/2021     DM2 Lab Results  Component Value Date   HGBA1C 5.2 10/25/2021   Semaglutide 0.5 mg weekly - still some nausea but that is tolerable  Needs to remember to eat regularly  Eating more high fiber foods /kale/broccoli and sweet potato  Metformin 500 mg bid   Eye exam: just had it 2 wk ago at [facility]   On arb and statin   Lab Results  Component Value Date   MICROALBUR 6.4 (H) 05/05/2021   MICROALBUR 9.1 (H) 01/25/2018   Has flu shot tomorrow at pharm     Hyperlipidemia Lab Results  Component Value Date   CHOL 152 10/25/2021   CHOL 178 05/05/2021   CHOL 196 11/09/2020   Lab Results  Component Value Date   HDL 54.10 10/25/2021   HDL 59.20 05/05/2021   HDL 62.10 11/09/2020   Lab Results  Component Value Date   LDLCALC 77 10/25/2021   LDLCALC 101 (H) 05/05/2021   LDLCALC 113 (H) 11/09/2020   Lab Results  Component Value Date   TRIG 103.0 10/25/2021   TRIG 92.0 05/05/2021   TRIG 108.0 11/09/2020    Lab Results  Component Value Date   CHOLHDL 3 10/25/2021   CHOLHDL 3 05/05/2021   CHOLHDL 3 11/09/2020   Lab Results  Component Value Date   LDLDIRECT 160.7 01/14/2007   LDLDIRECT 202.7 08/01/2006   Pravastatin 20 mg daily   Patient Active Problem List   Diagnosis Date Noted   Current use of proton pump inhibitor 03/23/2020   Allergic dermatitis 01/07/2018   Hearing loss 02/14/2016   Estrogen deficiency 02/14/2016   GERD (gastroesophageal reflux disease) 08/13/2015   Encounter for Medicare annual wellness exam 07/14/2014   Hypokalemia 12/15/2013   Encounter for screening mammogram for breast cancer 06/07/2011   SLEEP APNEA 09/13/2009   Class 1 obesity due to excess calories with serious comorbidity and body mass index (BMI) of 32.0 to 32.9 in adult 01/13/2009   SWEATING 07/15/2008   Diabetes type 2, controlled ([place]) 05/22/2007   TOBACCO USE, QUIT 05/22/2007   CEREBRAL ANEURYSM 08/09/2006   Hypothyroidism 07/25/2006   Hyperlipidemia associated with type 2 diabetes mellitus ([place]) 07/25/2006   ANXIETY 07/25/2006   Depression with anxiety 07/25/2006   Essential hypertension  07/25/2006   IBS 07/25/2006   Polyphagia 07/25/2006   SKIN CANCER, HX OF 07/25/2006   Past Medical History:  Diagnosis Date   Anxiety    Cataract 2019   corrected with surgery   Depression    Diabetes mellitus without complication ([place])    GERD (gastroesophageal reflux disease)    History of anxiety    History of cerebral aneurysm repair    history of cerebral aneurysm    History of depression    History of hyperglycemia    History of hyperlipidemia    History of hypothyroidism    History of melanoma    History of motion sickness    History of skin cancer    hx of melanoma left lower arm, recurrance   Hypercholesterolemia    Hypertension    Hypothyroidism    IBS (irritable bowel syndrome)    Nocturnal oxygen desaturation    PONV (postoperative nausea and vomiting)    Thyroid disease     Past Surgical History:  Procedure Laterality Date   ABDOMINAL HYSTERECTOMY     APPENDECTOMY  1966   BREAST BIOPSY Left 07-29-14    core bx FIBROCYSTIC CHANGES WITH FEW MICROCALCIFICATIONS.   cardiolite  12/1997   normal EF 68%   CATARACT EXTRACTION W/ INTRAOCULAR LENS  IMPLANT, BILATERAL Bilateral 04/2017   [place]  2008, 2009   coiling at [facility] N/A 01/22/2018   Procedure: COLONOSCOPY WITH PROPOFOL;  Surgeon: Toledo, Benay Pike, MD;  Location: [facility] ENDOSCOPY;  Service: Gastroenterology;  Laterality: N/A;   EYE SURGERY     RR   FOOT SURGERY  02/1998   bilateral   INTRAOPERATIVE ARTERIOGRAM  10/17/2015   MOHS SURGERY  1992   TOE SURGERY Left 2015   TONSILLECTOMY  1964   TOTAL ABDOMINAL HYSTERECTOMY W/ BILATERAL SALPINGOOPHORECTOMY     Social History   Tobacco Use   Smoking status: Former   Smokeless tobacco: Never  Vaping Use   Vaping Use: Never used  Substance Use Topics   Alcohol use: Yes    Alcohol/week: 1.0 standard drink of alcohol    Types: 1 Standard drinks or equivalent per week    Comment: occasional wine   Drug use: No   Family History  Problem Relation Age of Onset   Diabetes Father    Depression Father    CAD Father    Diabetes Mother    Diverticulosis Mother    Adrenal disorder Mother    Breast cancer Other        distant relatives (all on mother's side)   Cerebral aneurysm Sister    Allergies  Allergen Reactions   Codeine Nausea And Vomiting    REACTION: nausea and vomiting   Lisinopril     Cough/throat symptoms    Latex Rash   Current Outpatient Medications on File Prior to Visit  Medication Sig Dispense Refill   amLODipine (NORVASC) 10 MG tablet Take 1 tablet (10 mg total) by mouth daily. 90 tablet 3   aspirin EC 81 MG tablet Take 81 mg by mouth daily.     buPROPion (WELLBUTRIN XL) 300 MG 24 hr tablet Take 1 tablet (300 mg total) by mouth daily. 90 tablet 3   calcium carbonate  (CALCIUM 600) 600 MG TABS tablet Take 1 tablet (600 mg total) by mouth 2 (two) times daily. 180 tablet 3   Cholecalciferol (VITAMIN D3) 50 MCG (2000 UT) capsule Take 1 capsule (  2,000 Units total) by mouth daily. 90 capsule 3   FLUoxetine (PROZAC) 40 MG capsule Take 2 capsules (80 mg total) by mouth daily. 180 capsule 3   glucose blood (ONETOUCH ULTRA) test strip USE 1 STRIP TO CHECK GLUCOSE ONCE DAILYH AS DIRECTED (daignosis code: E11.9) 100 each 1   Homeopathic Products (ZICAM COLD REMEDY PO) Take by mouth.     hydrochlorothiazide (HYDRODIURIL) 25 MG tablet Take 1 tablet (25 mg total) by mouth daily. 90 tablet 3   hyoscyamine (LEVSIN SL) 0.125 MG SL tablet Place 0.125 mg under the tongue every 4 (four) hours as needed.     levothyroxine (SYNTHROID) 137 MCG tablet TAKE 1 TABLET DAILY BEFORE BREAKFAST 90 tablet 3   losartan (COZAAR) 100 MG tablet Take 1 tablet (100 mg total) by mouth daily. 90 tablet 3   metFORMIN (GLUCOPHAGE) 500 MG tablet TAKE 1 TABLET TWICE A DAY WITH MEALS 180 tablet 3   omeprazole (PRILOSEC) 20 MG capsule Take 1 capsule (20 mg total) by mouth daily. 90 capsule 3   OneTouch Delica Lancets 62V MISC Check blood sugar twice a day and as directed. Dx E11.9 200 each 1   potassium chloride (KLOR-CON) 10 MEQ tablet TAKE 1 TABLET DAILY 90 tablet 3   pravastatin (PRAVACHOL) 20 MG tablet Take 1 tablet (20 mg total) by mouth daily. 90 tablet 3   propranolol ER (INDERAL LA) 120 MG 24 hr capsule TAKE 1 CAPSULE DAILY 90 capsule 3   Semaglutide,0.25 or 0.'5MG'$ /DOS, 2 MG/3ML SOPN Inject 0.5 mg into the skin once a week. 3 mL 3   No current facility-administered medications on file prior to visit.    Review of Systems  Constitutional:  Negative for activity change, appetite change, fatigue, fever and unexpected weight change.  HENT:  Negative for congestion, ear pain, rhinorrhea, sinus pressure and sore throat.   Eyes:  Negative for pain, redness and visual disturbance.  Respiratory:   Negative for cough, shortness of breath and wheezing.   Cardiovascular:  Negative for chest pain and palpitations.  Gastrointestinal:  Positive for nausea. Negative for abdominal pain, blood in stool, constipation and diarrhea.  Endocrine: Negative for polydipsia and polyuria.  Genitourinary:  Negative for dysuria, frequency and urgency.  Musculoskeletal:  Negative for arthralgias, back pain and myalgias.  Skin:  Negative for pallor and rash.  Allergic/Immunologic: Negative for environmental allergies.  Neurological:  Negative for dizziness, syncope and headaches.  Hematological:  Negative for adenopathy. Does not bruise/bleed easily.  Psychiatric/Behavioral:  Negative for decreased concentration and dysphoric mood. The patient is not nervous/anxious.        Objective:   Physical Exam Constitutional:      General: She is not in acute distress.    Appearance: Normal appearance. She is well-developed. She is obese. She is not ill-appearing or diaphoretic.  HENT:     Head: Normocephalic and atraumatic.  Eyes:     Conjunctiva/sclera: Conjunctivae normal.     Pupils: Pupils are equal, round, and reactive to light.  Neck:     Thyroid: No thyromegaly.     Vascular: No carotid bruit or JVD.  Cardiovascular:     Rate and Rhythm: Normal rate and regular rhythm.     Heart sounds: Normal heart sounds.     No gallop.  Pulmonary:     Effort: Pulmonary effort is normal. No respiratory distress.     Breath sounds: Normal breath sounds. No wheezing or rales.  Abdominal:     General: There is  no distension or abdominal bruit.     Palpations: Abdomen is soft.  Musculoskeletal:     Cervical back: Normal range of motion and neck supple.     Right lower leg: No edema.     Left lower leg: No edema.  Lymphadenopathy:     Cervical: No cervical adenopathy.  Skin:    General: Skin is warm and dry.     Coloration: Skin is not pale.     Findings: No rash.  Neurological:     Mental Status: She is  alert.     Coordination: Coordination normal.     Deep Tendon Reflexes: Reflexes are normal and symmetric. Reflexes normal.  Psychiatric:        Mood and Affect: Mood normal.           Assessment & Plan:   Problem List Items Addressed This Visit       Cardiovascular and Mediastinum   Essential hypertension - Primary    bp in fair control at this time  BP Readings from Last 1 Encounters:  11/08/21 116/68  No changes needed Most recent labs reviewed  Disc lifstyle change with low sodium diet and exercise  Losartan 100 mg daily  Amlodipine 10 mg daily  Hctz 25 mg daily  Propranolol ER 120 mg daily         Endocrine   Diabetes type 2, controlled ([place])    Lab Results  Component Value Date   HGBA1C 5.2 10/25/2021  Very well controlled  Tolerates semaglutide 0.5 mg weekly  Metformin 500 mg bid  Eye exam was 2 wk ago  On arb and statin  microalb utd  Good diet and enc more exercise  F/u in 6 mo      Hyperlipidemia associated with type 2 diabetes mellitus ([place])    Disc goals for lipids and reasons to control them Rev last labs with pt Rev low sat fat diet in detail Improved LDL of 77 with pravastatin 20 mg daily  Tolerates this Diet is improved Commended         Other   Class 1 obesity due to excess calories with serious comorbidity and body mass index (BMI) of 32.0 to 32.9 in adult    Discussed how this problem influences overall health and the risks it imposes  Reviewed plan for weight loss with lower calorie diet (via better food choices and also portion control or program like weight watchers) and exercise building up to or more than 30 minutes 5 days per week including some aerobic activity   ozempic is helpful  Doing well

## 2021-11-08 NOTE — Assessment & Plan Note (Signed)
Discussed how this problem influences overall health and the risks it imposes  Reviewed plan for weight loss with lower calorie diet (via better food choices and also portion control or program like weight watchers) and exercise building up to or more than 30 minutes 5 days per week including some aerobic activity   ozempic is helpful  Doing well

## 2021-11-08 NOTE — Assessment & Plan Note (Signed)
Lab Results  Component Value Date   HGBA1C 5.2 10/25/2021   Very well controlled  Tolerates semaglutide 0.5 mg weekly  Metformin 500 mg bid  Eye exam was 2 wk ago  On arb and statin  microalb utd  Good diet and enc more exercise  F/u in 6 mo

## 2021-11-08 NOTE — Patient Instructions (Addendum)
Start working on Editor, commissioning - bands or light [employer], trainer, machines are good also if you have the time and money   More protein in diet if you can   Continue the low glycemic diet and exercise   Drink fluids   Follow up in 6 months with labs prior

## 2021-11-09 DIAGNOSIS — Z23 Encounter for immunization: Secondary | ICD-10-CM | POA: Diagnosis not present

## 2021-12-20 ENCOUNTER — Encounter: Payer: Self-pay | Admitting: Family Medicine

## 2022-01-19 ENCOUNTER — Telehealth: Payer: Self-pay | Admitting: Family Medicine

## 2022-01-19 MED ORDER — SEMAGLUTIDE(0.25 OR 0.5MG/DOS) 2 MG/3ML ~~LOC~~ SOPN: 0.5000 mg | PEN_INJECTOR | SUBCUTANEOUS | 3 refills | Status: DC

## 2022-01-19 NOTE — Telephone Encounter (Signed)
Left VM letting pt know Rx sent

## 2022-01-19 NOTE — Telephone Encounter (Signed)
I sent the 0.5 mg dose to [facility] this is well tolerated

## 2022-01-19 NOTE — Telephone Encounter (Signed)
Pt called to get her meds, Ozempic dosage changed? Pt stated the 1.0 mg is too strong, pt is requesting for dosage to be decreased to 0.'5mg'$ ? Pt stated she spoke to [facility] back on 10/24 during ov about lowering the dosage. Preferred pharmacy is [name], [place] . Call back # [phone]

## 2022-03-13 ENCOUNTER — Telehealth: Payer: Self-pay | Admitting: Family Medicine

## 2022-03-13 NOTE — Telephone Encounter (Signed)
Pt called requesting a call back from [place] regarding some questions/concerns she has about the following meds: amLODipine (NORVASC) 10 MG tablet & propranolol ER (INDERAL LA) 120 MG 24 hr capsule. Call back # [phone]

## 2022-03-13 NOTE — Telephone Encounter (Signed)
Left message to return call to our office.  

## 2022-03-14 DIAGNOSIS — D2261 Melanocytic nevi of right upper limb, including shoulder: Secondary | ICD-10-CM | POA: Diagnosis not present

## 2022-03-14 DIAGNOSIS — D2272 Melanocytic nevi of left lower limb, including hip: Secondary | ICD-10-CM | POA: Diagnosis not present

## 2022-03-14 DIAGNOSIS — D225 Melanocytic nevi of trunk: Secondary | ICD-10-CM | POA: Diagnosis not present

## 2022-03-14 DIAGNOSIS — Z85828 Personal history of other malignant neoplasm of skin: Secondary | ICD-10-CM | POA: Diagnosis not present

## 2022-03-14 DIAGNOSIS — Z85820 Personal history of malignant melanoma of skin: Secondary | ICD-10-CM | POA: Diagnosis not present

## 2022-03-14 DIAGNOSIS — D2262 Melanocytic nevi of left upper limb, including shoulder: Secondary | ICD-10-CM | POA: Diagnosis not present

## 2022-03-14 DIAGNOSIS — L650 Telogen effluvium: Secondary | ICD-10-CM | POA: Diagnosis not present

## 2022-03-19 ENCOUNTER — Other Ambulatory Visit: Payer: Self-pay | Admitting: Family Medicine

## 2022-03-27 ENCOUNTER — Ambulatory Visit (INDEPENDENT_AMBULATORY_CARE_PROVIDER_SITE_OTHER): Payer: Medicare Other

## 2022-03-27 VITALS — Ht 61.0 in | Wt 155.0 lb

## 2022-03-27 DIAGNOSIS — Z0000 Encounter for general adult medical examination without abnormal findings: Secondary | ICD-10-CM | POA: Diagnosis not present

## 2022-03-27 NOTE — Patient Instructions (Signed)
Andrea Cooper , Thank you for taking time to come for your Medicare Wellness Visit. I appreciate your ongoing commitment to your health goals. Please review the following plan we discussed and let me know if I can assist you in the future.   These are the goals we discussed:  Goals      Increase physical activity     Starting 01/26/2018,I will attempt to walk for 20 minutes 3 days per week.      Patient Stated     01/31/2019, I will work on exercising more daily.      Patient Stated     03/23/2020, I will maintain and continue medications as prescribed.      Patient Stated     Would like to continue eating healthier and drink more water     Patient Stated     Be more active.        This is a list of the screening recommended for you and due dates:  Health Maintenance  Topic Date Due   DTaP/Tdap/Td vaccine (3 - Tdap) 04/17/2015   COVID-19 Vaccine (5 - 2023-24 season) 09/16/2021   Mammogram  01/28/2022   Medicare Annual Wellness Visit  03/25/2022   Flu Shot  04/16/2022*   Hemoglobin A1C  04/26/2022   Yearly kidney health urinalysis for diabetes  05/06/2022   Eye exam for diabetics  09/16/2022   Yearly kidney function blood test for diabetes  10/26/2022   Complete foot exam   11/09/2022   Colon Cancer Screening  01/23/2028   Pneumonia Vaccine  Completed   DEXA scan (bone density measurement)  Completed   Hepatitis C Screening: USPSTF Recommendation to screen - Ages 23-79 yo.  Completed   Zoster (Shingles) Vaccine  Completed   HPV Vaccine  Aged Out  *Topic was postponed. The date shown is not the original due date.    Advanced directives: Please bring a copy of your health care power of attorney and living will to the office to be added to your chart at your convenience.   Conditions/risks identified: Aim for 30 minutes of exercise or brisk walking, 6-8 glasses of water, and 5 servings of fruits and vegetables each day.   Next appointment: Follow up in one year for your annual  wellness visit 03/29/2023 @ 9:15 via telephone.   Preventive Care 74 Years and Older, Female Preventive care refers to lifestyle choices and visits with your health care provider that can promote health and wellness. What does preventive care include? A yearly physical exam. This is also called an annual well check. Dental exams once or twice a year. Routine eye exams. Ask your health care provider how often you should have your eyes checked. Personal lifestyle choices, including: Daily care of your teeth and gums. Regular physical activity. Eating a healthy diet. Avoiding tobacco and drug use. Limiting alcohol use. Practicing safe sex. Taking low-dose aspirin every day. Taking vitamin and mineral supplements as recommended by your health care provider. What happens during an annual well check? The services and screenings done by your health care provider during your annual well check will depend on your age, overall health, lifestyle risk factors, and family history of disease. Counseling  Your health care provider may ask you questions about your: Alcohol use. Tobacco use. Drug use. Emotional well-being. Home and relationship well-being. Sexual activity. Eating habits. History of falls. Memory and ability to understand (cognition). Work and work Statistician. Reproductive health. Screening  You may have the following tests  or measurements: Height, weight, and BMI. Blood pressure. Lipid and cholesterol levels. These may be checked every 5 years, or more frequently if you are over 26 years old. Skin check. Lung cancer screening. You may have this screening every year starting at age 65 if you have a 30-pack-year history of smoking and currently smoke or have quit within the past 15 years. Fecal occult blood test (FOBT) of the stool. You may have this test every year starting at age 79. Flexible sigmoidoscopy or colonoscopy. You may have a sigmoidoscopy every 5 years or a  colonoscopy every 10 years starting at age 64. Hepatitis C blood test. Hepatitis B blood test. Sexually transmitted disease (STD) testing. Diabetes screening. This is done by checking your blood sugar (glucose) after you have not eaten for a while (fasting). You may have this done every 1-3 years. Bone density scan. This is done to screen for osteoporosis. You may have this done starting at age 5. Mammogram. This may be done every 1-2 years. Talk to your health care provider about how often you should have regular mammograms. Talk with your health care provider about your test results, treatment options, and if necessary, the need for more tests. Vaccines  Your health care provider may recommend certain vaccines, such as: Influenza vaccine. This is recommended every year. Tetanus, diphtheria, and acellular pertussis (Tdap, Td) vaccine. You may need a Td booster every 10 years. Zoster vaccine. You may need this after age 51. Pneumococcal 13-valent conjugate (PCV13) vaccine. One dose is recommended after age 68. Pneumococcal polysaccharide (PPSV23) vaccine. One dose is recommended after age 58. Talk to your health care provider about which screenings and vaccines you need and how often you need them. This information is not intended to replace advice given to you by your health care provider. Make sure you discuss any questions you have with your health care provider. Document Released: 01/29/2015 Document Revised: 09/22/2015 Document Reviewed: 11/03/2014 Elsevier Interactive Patient Education  2017 [employer] Prevention in the Home Falls can cause injuries. They can happen to people of all ages. There are many things you can do to make your home safe and to help prevent falls. What can I do on the outside of my home? Regularly fix the edges of walkways and driveways and fix any cracks. Remove anything that might make you trip as you walk through a door, such as a raised step or  threshold. Trim any bushes or trees on the path to your home. Use bright outdoor lighting. Clear any walking paths of anything that might make someone trip, such as rocks or tools. Regularly check to see if handrails are loose or broken. Make sure that both sides of any steps have handrails. Any raised decks and porches should have guardrails on the edges. Have any leaves, snow, or ice cleared regularly. Use sand or salt on walking paths during winter. Clean up any spills in your garage right away. This includes oil or grease spills. What can I do in the bathroom? Use night lights. Install grab bars by the toilet and in the tub and shower. Do not use towel bars as grab bars. Use non-skid mats or decals in the tub or shower. If you need to sit down in the shower, use a plastic, non-slip stool. Keep the floor dry. Clean up any water that spills on the floor as soon as it happens. Remove soap buildup in the tub or shower regularly. Attach bath mats securely with double-sided  non-slip rug tape. Do not have throw rugs and other things on the floor that can make you trip. What can I do in the bedroom? Use night lights. Make sure that you have a light by your bed that is easy to reach. Do not use any sheets or blankets that are too big for your bed. They should not hang down onto the floor. Have a firm chair that has side arms. You can use this for support while you get dressed. Do not have throw rugs and other things on the floor that can make you trip. What can I do in the kitchen? Clean up any spills right away. Avoid walking on wet floors. Keep items that you use a lot in easy-to-reach places. If you need to reach something above you, use a strong step stool that has a grab bar. Keep electrical cords out of the way. Do not use floor polish or wax that makes floors slippery. If you must use wax, use non-skid floor wax. Do not have throw rugs and other things on the floor that can make you  trip. What can I do with my stairs? Do not leave any items on the stairs. Make sure that there are handrails on both sides of the stairs and use them. Fix handrails that are broken or loose. Make sure that handrails are as long as the stairways. Check any carpeting to make sure that it is firmly attached to the stairs. Fix any carpet that is loose or worn. Avoid having throw rugs at the top or bottom of the stairs. If you do have throw rugs, attach them to the floor with carpet tape. Make sure that you have a light switch at the top of the stairs and the bottom of the stairs. If you do not have them, ask someone to add them for you. What else can I do to help prevent falls? Wear shoes that: Do not have high heels. Have rubber bottoms. Are comfortable and fit you well. Are closed at the toe. Do not wear sandals. If you use a stepladder: Make sure that it is fully opened. Do not climb a closed stepladder. Make sure that both sides of the stepladder are locked into place. Ask someone to hold it for you, if possible. Clearly mark and make sure that you can see: Any grab bars or handrails. First and last steps. Where the edge of each step is. Use tools that help you move around (mobility aids) if they are needed. These include: Canes. Walkers. Scooters. Crutches. Turn on the lights when you go into a dark area. Replace any light bulbs as soon as they burn out. Set up your furniture so you have a clear path. Avoid moving your furniture around. If any of your floors are uneven, fix them. If there are any pets around you, be aware of where they are. Review your medicines with your doctor. Some medicines can make you feel dizzy. This can increase your chance of falling. Ask your doctor what other things that you can do to help prevent falls. This information is not intended to replace advice given to you by your health care provider. Make sure you discuss any questions you have with your  health care provider. Document Released: 10/29/2008 Document Revised: 06/10/2015 Document Reviewed: 02/06/2014 Elsevier Interactive Patient Education  2017 [employer].

## 2022-03-27 NOTE — Progress Notes (Signed)
I connected with  [name] on 03/27/22 by a audio enabled telemedicine application and verified that I am speaking with the correct person using two identifiers.  Patient Location: Home  Provider Location: Home Office  I discussed the limitations of evaluation and management by telemedicine. The patient expressed understanding and agreed to proceed.  Subjective:   Andrea Cooper is a 72 y.o. female who presents for Medicare Annual (Subsequent) preventive examination.  Review of Systems      Cardiac Risk Factors include: advanced age (>41mn, >>26women);diabetes mellitus;hypertension     Objective:    Today's Vitals   03/27/22 0822  Weight: 155 lb (70.3 kg)  Height: '5\' 1"'$  (1.549 m)   Body mass index is 29.29 kg/m.     03/27/2022    8:31 AM 03/24/2021    8:20 AM 03/23/2020    8:18 AM 01/31/2019   12:05 PM 01/25/2018    8:56 AM 01/22/2018    7:09 AM 12/14/2017   12:34 PM  Advanced Directives  Does Patient Have a Medical Advance Directive? Yes No Yes Yes Yes Yes Yes  Type of [place]amed[place]of [place] will  [place] will  Living will  Copy of [place] Chart? No - copy requested  No - copy requested No - copy requested No - copy requested    Would patient like information on creating a medical advance directive?  Yes (MAU/Ambulatory/Procedural Areas - Information given)     No - Patient declined    Current Medications (verified) Outpatient Encounter Medications as of 03/27/2022  Medication Sig   amLODipine (NORVASC) 10 MG tablet TAKE 1 TABLET DAILY   aspirin EC 81 MG tablet Take 81 mg by mouth daily.   buPROPion (WELLBUTRIN XL) 300 MG 24 hr tablet TAKE 1 TABLET DAILY   calcium carbonate (CALCIUM 600) 600 MG TABS tablet Take 1 tablet (600 mg total) by mouth 2 (two) times daily.   Cholecalciferol (VITAMIN D3) 50 MCG (2000  UT) capsule Take 1 capsule (2,000 Units total) by mouth daily.   FLUoxetine (PROZAC) 40 MG capsule TAKE 2 CAPSULES DAILY   glucose blood (ONETOUCH ULTRA) test strip USE 1 STRIP TO CHECK GLUCOSE ONCE DAILYH AS DIRECTED (daignosis code: E11.9)   hydrochlorothiazide (HYDRODIURIL) 25 MG tablet TAKE 1 TABLET DAILY   levothyroxine (SYNTHROID) 137 MCG tablet TAKE 1 TABLET DAILY BEFORE BREAKFAST   losartan (COZAAR) 100 MG tablet TAKE 1 TABLET DAILY   metFORMIN (GLUCOPHAGE) 500 MG tablet TAKE 1 TABLET TWICE A DAY WITH MEALS   omeprazole (PRILOSEC) 20 MG capsule TAKE 1 CAPSULE DAILY   OneTouch Delica Lancets [id number] Check blood sugar twice a day and as directed. Dx E11.9   potassium chloride (KLOR-CON) 10 MEQ tablet TAKE 1 TABLET DAILY   pravastatin (PRAVACHOL) 20 MG tablet TAKE 1 TABLET DAILY   propranolol ER (INDERAL LA) 120 MG 24 hr capsule TAKE 1 CAPSULE DAILY   Semaglutide,0.25 or 0.'5MG'$ /DOS, 2 MG/3ML SOPN Inject 0.5 mg into the skin once a week.   Homeopathic Products (ZICAM COLD REMEDY PO) Take by mouth. (Patient not taking: Reported on 03/27/2022)   hyoscyamine (LEVSIN SL) 0.125 MG SL tablet Place 0.125 mg under the tongue every 4 (four) hours as needed. (Patient not taking: Reported on 03/27/2022)   No facility-administered encounter medications on file as of 03/27/2022.    Allergies (verified) Codeine, Lisinopril, and Latex  History: Past Medical History:  Diagnosis Date   Anxiety    Cataract 2019   corrected with surgery   Depression    Diabetes mellitus without complication ([place])    GERD (gastroesophageal reflux disease)    History of anxiety    History of cerebral aneurysm repair    history of cerebral aneurysm    History of depression    History of hyperglycemia    History of hyperlipidemia    History of hypothyroidism    History of melanoma    History of motion sickness    History of skin cancer    hx of melanoma left lower arm, recurrance   Hypercholesterolemia     Hypertension    Hypothyroidism    IBS (irritable bowel syndrome)    Nocturnal oxygen desaturation    PONV (postoperative nausea and vomiting)    Thyroid disease    Past Surgical History:  Procedure Laterality Date   ABDOMINAL HYSTERECTOMY     APPENDECTOMY  1966   BREAST BIOPSY Left 07-29-14    core bx FIBROCYSTIC CHANGES WITH FEW MICROCALCIFICATIONS.   cardiolite  12/1997   normal EF 68%   CATARACT EXTRACTION W/ INTRAOCULAR LENS  IMPLANT, BILATERAL Bilateral 04/2017   [place]  2008, 2009   coiling at [place] N/A 01/22/2018   Procedure: COLONOSCOPY WITH PROPOFOL;  Surgeon: Toledo, Benay Pike, MD;  Location: [facility] ENDOSCOPY;  Service: Gastroenterology;  Laterality: N/A;   EYE SURGERY     RR   FOOT SURGERY  02/1998   bilateral   INTRAOPERATIVE ARTERIOGRAM  10/17/2015   MOHS SURGERY  1992   TOE SURGERY Left 2015   TONSILLECTOMY  1964   TOTAL ABDOMINAL HYSTERECTOMY W/ BILATERAL SALPINGOOPHORECTOMY     Family History  Problem Relation Age of Onset   Diabetes Father    Depression Father    CAD Father    Diabetes Mother    Diverticulosis Mother    Adrenal disorder Mother    Breast cancer Other        distant relatives (all on mother's side)   Cerebral aneurysm Sister    Social History   Socioeconomic History   Marital status: Divorced    Spouse name: Not on file   Number of children: Not on file   Years of education: Not on file   Highest education level: Not on file  Occupational History   Not on file  Tobacco Use   Smoking status: Former   Smokeless tobacco: Never  Vaping Use   Vaping Use: Never used  Substance and Sexual Activity   Alcohol use: Yes    Alcohol/week: 1.0 standard drink of alcohol    Types: 1 Standard drinks or equivalent per week    Comment: occasional wine   Drug use: No   Sexual activity: Never  Other Topics Concern   Not on file  Social History Narrative   Not on file   Social  Determinants of Health   Financial Resource Strain: Low Risk  (03/27/2022)   Overall Financial Resource Strain (CARDIA)    Difficulty of Paying Living Expenses: Not hard at all  Food Insecurity: No Food Insecurity (03/27/2022)   Hunger Vital Sign    Worried About Running Out of Food in the Last Year: Never true    Ran Out of Food in the Last Year: Never true  Transportation Needs: No Transportation Needs (03/27/2022)   PRAPARE - Transportation  Lack of Transportation (Medical): No    Lack of Transportation (Non-Medical): No  Physical Activity: Inactive (03/27/2022)   Exercise Vital Sign    Days of Exercise per Week: 0 days    Minutes of Exercise per Session: 0 min  Stress: No Stress Concern Present (03/27/2022)   [place]    Feeling of Stress : Not at all  Social Connections: Moderately Integrated (03/27/2022)   Social Connection and Isolation Panel [NHANES]    Frequency of Communication with Friends and Family: More than three times a week    Frequency of Social Gatherings with Friends and Family: More than three times a week    Attends Religious Services: More than 4 times per year    Active Member of [employer] or Organizations: Yes    Attends Music therapist: More than 4 times per year    Marital Status: Divorced    Tobacco Counseling Counseling given: Not Answered   Clinical Intake:  Pre-visit preparation completed: Yes  Pain : No/denies pain     Nutritional Risks: None Diabetes: No  How often do you need to have someone help you when you read instructions, pamphlets, or other written materials from your doctor or pharmacy?: 1 - Never  Diabetic?Nutrition Risk Assessment:  Has the patient had any N/V/D within the last 2 months?  No  Does the patient have any non-healing wounds?  No  Has the patient had any unintentional weight loss or weight gain?  No   Diabetes:  Is the patient  diabetic?  Yes  If diabetic, was a CBG obtained today?  No  Did the patient bring in their glucometer from home?  No  How often do you monitor your CBG's? BID.   Financial Strains and Diabetes Management:  Are you having any financial strains with the device, your supplies or your medication? No .  Does the patient want to be seen by Chronic Care Management for management of their diabetes?  No  Would the patient like to be referred to a Nutritionist or for Diabetic Management?  No   Diabetic Exams:  Diabetic Eye Exam: Completed at [facility]  Diabetic Foot Exam: Completed 11/08/21 PCP    Interpreter Needed?: No  Information entered by :: C.Courtland Coppa LPN   Activities of Daily Living    03/27/2022    8:32 AM  In your present state of health, do you have any difficulty performing the following activities:  Hearing? 0  Vision? 0  Difficulty concentrating or making decisions? 0  Walking or climbing stairs? 0  Dressing or bathing? 0  Doing errands, shopping? 0  Preparing Food and eating ? N  Using the Toilet? N  In the past six months, have you accidently leaked urine? Y  Comment Occasionally when coughs or sneezes  Do you have problems with loss of bowel control? N  Managing your Medications? N  Managing your Finances? N  Housekeeping or managing your Housekeeping? N    Patient Care Team: Tower, Wynelle Fanny, MD as PCP - General Kate Sable, MD as PCP - Cardiology (Cardiology) Bary Castilla, Forest Gleason, MD (General Surgery) Chucky May, MD as Consulting Physician (Psychiatry) Hoy Finlay, MD as Referring Physician (Neurosurgery) Christiane Ha as Consulting Physician (Optometry) Rolm Bookbinder, MD as Consulting Physician (Dermatology)  Indicate any recent Medical Services you may have received from other than [facility] providers in the past year (date may be approximate).     Assessment:  This is a routine wellness examination for Crystin.  Hearing/Vision  screen Hearing Screening - Comments:: No aids Vision Screening - Comments:: Readers - [facility]  Dietary issues and exercise activities discussed: Current Exercise Habits: The patient does not participate in regular exercise at present, Exercise limited by: None identified   Goals Addressed             This Visit's Progress    Patient Stated       Be more active.       Depression Screen    03/27/2022    8:31 AM 03/27/2022    8:30 AM 06/21/2021    2:09 PM 03/24/2021    8:24 AM 03/23/2020    8:20 AM 01/31/2019   12:06 PM 01/25/2018    8:43 AM  PHQ 2/9 Scores  PHQ - 2 Score 0 0 0 0 2 0 2  PHQ- 9 Score     2 0 9    Fall Risk    03/27/2022    8:32 AM 06/21/2021    2:09 PM 03/24/2021    8:23 AM 03/23/2020    8:20 AM 01/31/2019   12:05 PM  Fall Risk   Falls in the past year? 0  0 0 0  Number falls in past yr: 0  0 0 0  Injury with Fall? 0  0 0 0  Risk for fall due to : No Fall Risks   Medication side effect Medication side effect  Follow up Falls prevention discussed;Falls evaluation completed Falls evaluation completed Falls prevention discussed Falls evaluation completed;Falls prevention discussed Falls evaluation completed;Falls prevention discussed    FALL RISK PREVENTION PERTAINING TO THE HOME:  Any stairs in or around the home? Yes  If so, are there any without handrails? No  Home free of loose throw rugs in walkways, pet beds, electrical cords, etc? Yes  Adequate lighting in your home to reduce risk of falls? Yes   ASSISTIVE DEVICES UTILIZED TO PREVENT FALLS:  Life alert? No  Use of a cane, walker or w/c? No  Grab bars in the bathroom? No  Shower chair or bench in shower? No  Elevated toilet seat or a handicapped toilet? Yes   Cognitive Function:    03/23/2020    8:24 AM 01/31/2019   12:08 PM 01/25/2018    9:49 AM 01/19/2017    8:54 AM 01/19/2016    9:57 AM  MMSE - Mini Mental State Exam  Not completed: Refused      Orientation to time  '5 5 5 5  '$ Orientation to Place  '5 5  5 5  '$ Registration  '3 3 3 3  '$ Attention/ Calculation  5 0 0 0  Recall  '3 3 3 3  '$ Language- name 2 objects   0 0 0  Language- repeat  '1 1 1 1  '$ Language- follow 3 step command   '3 3 3  '$ Language- read & follow direction   0 0 0  Write a sentence   0 0 0  Copy design   0 0 0  Total score   '20 20 20        '$ 03/27/2022    8:33 AM  6CIT Screen  What Year? 0 points  What month? 0 points  What time? 0 points  Count back from 20 0 points  Months in reverse 0 points  Repeat phrase 0 points  Total Score 0 points    Immunizations Immunization History  Administered Date(s) Administered   Fluad  Quad(high Dose 65+) 09/28/2018, 11/09/2020   Influenza Whole 01/13/2009   Influenza, High Dose Seasonal PF 11/06/2016, 10/24/2017, 09/09/2019   Influenza,inj,Quad PF,6+ Mos 10/07/2013, 12/21/2015   Moderna Covid-19 Vaccine Bivalent Booster 67yr & up 12/21/2020   PFIZER(Purple Top)SARS-COV-2 Vaccination 03/04/2019, 03/25/2019, 10/30/2019   Pneumococcal Conjugate-13 07/14/2014, 06/20/2017   Pneumococcal Polysaccharide-23 10/01/2008, 02/14/2016, 09/09/2019   Td 01/08/1997, 04/16/2005   Zoster Recombinat (Shingrix) 10/06/2018, 12/06/2018   Zoster, Live 12/15/2013    TDAP status: Due, Education has been provided regarding the importance of this vaccine. Advised may receive this vaccine at local pharmacy or Health Dept. Aware to provide a copy of the vaccination record if obtained from local pharmacy or Health Dept. Verbalized acceptance and understanding.  Flu Vaccine status: Up to date  Pneumococcal vaccine status: Up to date  Covid-19 vaccine status: Completed vaccines  Qualifies for Shingles Vaccine? No   Zostavax completed  unknown   Shingrix Completed?: Yes  Screening Tests Health Maintenance  Topic Date Due   DTaP/Tdap/Td (3 - Tdap) 04/17/2015   COVID-19 Vaccine (5 - 2023-24 season) 09/16/2021   MAMMOGRAM  01/28/2022   INFLUENZA VACCINE  04/16/2022 (Originally 08/16/2021)   HEMOGLOBIN  A1C  04/26/2022   Diabetic kidney evaluation - Urine ACR  05/06/2022   OPHTHALMOLOGY EXAM  09/16/2022   Diabetic kidney evaluation - eGFR measurement  10/26/2022   FOOT EXAM  11/09/2022   Medicare Annual Wellness (AWV)  03/27/2023   COLONOSCOPY (Pts 45-415yrInsurance coverage will need to be confirmed)  01/23/2028   Pneumonia Vaccine 74Years old  Completed   DEXA SCAN  Completed   Hepatitis C Screening  Completed   Zoster Vaccines- Shingrix  Completed   HPV VACCINES  Aged Out    Health Maintenance  Health Maintenance Due  Topic Date Due   DTaP/Tdap/Td (3 - Tdap) 04/17/2015   COVID-19 Vaccine (5 - 2023-24 season) 09/16/2021   MAMMOGRAM  01/28/2022    Colorectal cancer screening: Type of screening: Colonoscopy. Completed 01/22/2018. Repeat every 10 years  [facility]: [facility]   Bone Density status: Completed 08/11/2021. Results reflect: Bone density results: NORMAL. Repeat every 5 years.  Lung Cancer Screening: (Low Dose CT Chest recommended if Age 74-80ears, 30 pack-year currently smoking OR have quit w/in 15years.) does not qualify.   Lung Cancer Screening Referral: no  Additional Screening:  Hepatitis C Screening: does not qualify; Completed 01/19/2016  Vision Screening: Recommended annual ophthalmology exams for early detection of glaucoma and other disorders of the eye. Is the patient up to date with their annual eye exam?  Yes  Who is the provider or what is the name of the office in which the patient attends annual eye exams? [facility] If pt is not established with a provider, would they like to be referred to a provider to establish care? No .   Dental Screening: Recommended annual dental exams for proper oral hygiene  Community Resource Referral / Chronic Care Management: CRR required this visit?  No   CCM required this visit?  No      Plan:     I have personally reviewed and noted the following in the  patient's chart:   Medical and social history Use of alcohol, tobacco or illicit drugs  Current medications and supplements including opioid prescriptions. Patient is not currently taking opioid prescriptions. Functional ability and status Nutritional status Physical activity Advanced directives List of other physicians Hospitalizations, surgeries, and ER visits in  previous 12 months Vitals Screenings to include cognitive, depression, and falls Referrals and appointments  In addition, I have reviewed and discussed with patient certain preventive protocols, quality metrics, and best practice recommendations. A written personalized care plan for preventive services as well as general preventive health recommendations were provided to patient.     Lebron Conners, LPN   [phone]   Nurse Notes: none

## 2022-05-03 ENCOUNTER — Other Ambulatory Visit: Payer: Medicare Other

## 2022-05-03 ENCOUNTER — Other Ambulatory Visit (INDEPENDENT_AMBULATORY_CARE_PROVIDER_SITE_OTHER): Payer: Medicare Other

## 2022-05-03 DIAGNOSIS — E1169 Type 2 diabetes mellitus with other specified complication: Secondary | ICD-10-CM | POA: Diagnosis not present

## 2022-05-03 DIAGNOSIS — E039 Hypothyroidism, unspecified: Secondary | ICD-10-CM

## 2022-05-03 DIAGNOSIS — E785 Hyperlipidemia, unspecified: Secondary | ICD-10-CM | POA: Diagnosis not present

## 2022-05-03 DIAGNOSIS — Z79899 Other long term (current) drug therapy: Secondary | ICD-10-CM

## 2022-05-03 DIAGNOSIS — I10 Essential (primary) hypertension: Secondary | ICD-10-CM

## 2022-05-03 DIAGNOSIS — E119 Type 2 diabetes mellitus without complications: Secondary | ICD-10-CM | POA: Diagnosis not present

## 2022-05-03 LAB — CBC WITH DIFFERENTIAL/PLATELET
Basophils Absolute: 0 10*3/uL (ref 0.0–0.1)
Basophils Relative: 0.7 % (ref 0.0–3.0)
Eosinophils Absolute: 0.1 10*3/uL (ref 0.0–0.7)
Eosinophils Relative: 1.6 % (ref 0.0–5.0)
HCT: 40.6 % (ref 36.0–46.0)
Hemoglobin: 13.8 g/dL (ref 12.0–15.0)
Lymphocytes Relative: 37 % (ref 12.0–46.0)
Lymphs Abs: 2.4 10*3/uL (ref 0.7–4.0)
MCHC: 33.9 g/dL (ref 30.0–36.0)
MCV: 91.4 fl (ref 78.0–100.0)
Monocytes Absolute: 0.4 10*3/uL (ref 0.1–1.0)
Monocytes Relative: 6.1 % (ref 3.0–12.0)
Neutro Abs: 3.6 10*3/uL (ref 1.4–7.7)
Neutrophils Relative %: 54.6 % (ref 43.0–77.0)
Platelets: 301 10*3/uL (ref 150.0–400.0)
RBC: 4.44 Mil/uL (ref 3.87–5.11)
RDW: 13.2 % (ref 11.5–15.5)
WBC: 6.6 10*3/uL (ref 4.0–10.5)

## 2022-05-03 LAB — COMPREHENSIVE METABOLIC PANEL
ALT: 16 U/L (ref 0–35)
AST: 15 U/L (ref 0–37)
Albumin: 4.2 g/dL (ref 3.5–5.2)
Alkaline Phosphatase: 40 U/L (ref 39–117)
BUN: 18 mg/dL (ref 6–23)
CO2: 31 mEq/L (ref 19–32)
Calcium: 9.9 mg/dL (ref 8.4–10.5)
Chloride: 101 mEq/L (ref 96–112)
Creatinine, Ser: 0.76 mg/dL (ref 0.40–1.20)
GFR: 77.77 mL/min (ref >60.00)
Glucose, Bld: 108 mg/dL — ABNORMAL HIGH (ref 70–99)
Potassium: 4.2 mEq/L (ref 3.5–5.1)
Sodium: 142 mEq/L (ref 135–145)
Total Bilirubin: 0.6 mg/dL (ref 0.2–1.2)
Total Protein: 6.5 g/dL (ref 6.0–8.3)

## 2022-05-03 LAB — HEMOGLOBIN A1C: Hgb A1c MFr Bld: 5.1 % (ref 4.6–6.5)

## 2022-05-03 LAB — LIPID PANEL
Cholesterol: 165 mg/dL (ref 0–200)
HDL: 67.2 mg/dL (ref >39.00)
LDL Cholesterol: 81 mg/dL (ref 0–99)
NonHDL: 97.58
Total CHOL/HDL Ratio: 2
Triglycerides: 81 mg/dL (ref 0.0–149.0)
VLDL: 16.2 mg/dL (ref 0.0–40.0)

## 2022-05-03 LAB — TSH: TSH: 0.42 u[IU]/mL (ref 0.35–5.50)

## 2022-05-03 LAB — VITAMIN B12: Vitamin B-12: >1500 pg/mL — ABNORMAL HIGH (ref 211–911)

## 2022-05-10 ENCOUNTER — Encounter: Payer: Self-pay | Admitting: Family Medicine

## 2022-05-10 ENCOUNTER — Ambulatory Visit (INDEPENDENT_AMBULATORY_CARE_PROVIDER_SITE_OTHER): Payer: Medicare Other | Admitting: Family Medicine

## 2022-05-10 VITALS — BP 122/70 | HR 73 | Temp 97.8°F | Ht 61.0 in | Wt 161.0 lb

## 2022-05-10 DIAGNOSIS — E039 Hypothyroidism, unspecified: Secondary | ICD-10-CM

## 2022-05-10 DIAGNOSIS — K219 Gastro-esophageal reflux disease without esophagitis: Secondary | ICD-10-CM | POA: Diagnosis not present

## 2022-05-10 DIAGNOSIS — E785 Hyperlipidemia, unspecified: Secondary | ICD-10-CM | POA: Diagnosis not present

## 2022-05-10 DIAGNOSIS — Z7984 Long term (current) use of oral hypoglycemic drugs: Secondary | ICD-10-CM

## 2022-05-10 DIAGNOSIS — Z7985 Long-term (current) use of injectable non-insulin antidiabetic drugs: Secondary | ICD-10-CM | POA: Diagnosis not present

## 2022-05-10 DIAGNOSIS — I10 Essential (primary) hypertension: Secondary | ICD-10-CM

## 2022-05-10 DIAGNOSIS — Z1231 Encounter for screening mammogram for malignant neoplasm of breast: Secondary | ICD-10-CM

## 2022-05-10 DIAGNOSIS — E1169 Type 2 diabetes mellitus with other specified complication: Secondary | ICD-10-CM | POA: Diagnosis not present

## 2022-05-10 DIAGNOSIS — E119 Type 2 diabetes mellitus without complications: Secondary | ICD-10-CM

## 2022-05-10 LAB — MICROALBUMIN / CREATININE URINE RATIO
Creatinine,U: 130.7 mg/dL
Microalb Creat Ratio: 2.5 mg/g (ref 0.0–30.0)
Microalb, Ur: 3.3 mg/dL — ABNORMAL HIGH (ref 0.0–1.9)

## 2022-05-10 MED ORDER — HYDROCHLOROTHIAZIDE 25 MG PO TABS: 25.0000 mg | ORAL_TABLET | Freq: Every day | ORAL | 3 refills | Status: DC

## 2022-05-10 MED ORDER — FLUOXETINE HCL 40 MG PO CAPS: 80.0000 mg | ORAL_CAPSULE | Freq: Every day | ORAL | 3 refills | Status: DC

## 2022-05-10 MED ORDER — POTASSIUM CHLORIDE ER 10 MEQ PO TBCR: 10.0000 meq | EXTENDED_RELEASE_TABLET | Freq: Every day | ORAL | 3 refills | Status: DC

## 2022-05-10 MED ORDER — BUPROPION HCL ER (XL) 300 MG PO TB24: 300.0000 mg | ORAL_TABLET | Freq: Every day | ORAL | 3 refills | Status: DC

## 2022-05-10 MED ORDER — METFORMIN HCL 500 MG PO TABS: 500.0000 mg | ORAL_TABLET | Freq: Two times a day (BID) | ORAL | 3 refills | Status: DC

## 2022-05-10 MED ORDER — LEVOTHYROXINE SODIUM 137 MCG PO TABS: ORAL_TABLET | ORAL | 3 refills | Status: DC

## 2022-05-10 MED ORDER — LOSARTAN POTASSIUM 100 MG PO TABS: 100.0000 mg | ORAL_TABLET | Freq: Every day | ORAL | 3 refills | Status: DC

## 2022-05-10 MED ORDER — SEMAGLUTIDE (1 MG/DOSE) 4 MG/3ML ~~LOC~~ SOPN: 1.0000 mg | PEN_INJECTOR | SUBCUTANEOUS | 0 refills | Status: AC

## 2022-05-10 MED ORDER — OMEPRAZOLE 20 MG PO CPDR: 20.0000 mg | DELAYED_RELEASE_CAPSULE | Freq: Every day | ORAL | 3 refills | Status: DC

## 2022-05-10 MED ORDER — PROPRANOLOL HCL ER 120 MG PO CP24: 120.0000 mg | ORAL_CAPSULE | Freq: Every day | ORAL | 3 refills | Status: DC

## 2022-05-10 MED ORDER — AMLODIPINE BESYLATE 10 MG PO TABS: 10.0000 mg | ORAL_TABLET | Freq: Every day | ORAL | 3 refills | Status: DC

## 2022-05-10 MED ORDER — PRAVASTATIN SODIUM 20 MG PO TABS: 20.0000 mg | ORAL_TABLET | Freq: Every day | ORAL | 3 refills | Status: DC

## 2022-05-10 NOTE — Assessment & Plan Note (Signed)
On ppi so watching B12 level

## 2022-05-10 NOTE — Patient Instructions (Addendum)
Add some strength training to your routine, this is important for bone and brain health and can reduce your risk of falls and help your body use insulin properly and regulate weight  Light weights, exercise bands , and internet videos are a good way to start  Yoga (chair or regular), machines , floor exercises or a gym with machines are also good options     You are due for a tetanus shot  You can update that at a pharmacy or health department   Your B12 level is fine  Take your B12 every other day instead of daily   Go up on the semaglutide to 1 mg weekly  If you don't tolerate it let us know     Call and schedule your mammogram  You have an order for:    2D Mammogram    3D Mammogram    Bone Density     Please call for appointment:     [facility]  [address] [place] [address]  [phone]    [facility])   [address]  [place], [place] [address]  [phone]    [facility]      [address] [place], [place]        [phone]           [facility]  [address] [place], [place]  [phone]   [facility]   [address]   [place], [place] [address]  [phone]   [facility]  [address] [place], [place] [address] [phone]    Make sure to wear two piece clothing  No lotions powders or deodorants the day of the appointment Make sure to bring picture ID and insurance card.  Bring list of medications you are currently taking including any supplements.   Schedule your screening mammogram through MyChart!   [facility] can now be scheduled through MyChart.  Log into your MyChart account.  Go to 'Visit' (or 'Appointments' if  on mobile App) -->  Schedule an  Appointment  Under 'Select a Reason for Visit' choose the Mammogram  Screening option.  Complete the pre-visit questions  and select the time and place that  best fits your schedule

## 2022-05-10 NOTE — Assessment & Plan Note (Signed)
Hypothyroidism  Pt has no clinical changes No change in energy level/ hair or skin/ edema and no tremor Lab Results  Component Value Date   TSH 0.42 05/03/2022    Plan to continue levothyroxine 137 mcg daily

## 2022-05-10 NOTE — Assessment & Plan Note (Signed)
Mammogram ordered °Pt will call to schedule  °

## 2022-05-10 NOTE — Assessment & Plan Note (Signed)
bp in fair control at this time  BP Readings from Last 1 Encounters:  05/10/22 122/70   No changes needed Most recent labs reviewed  Disc lifstyle change with low sodium diet and exercise  Losartan 100 mg daily  Amlodipine 10 mg daily  Hctz 25 mg daily  Propranolol ER 120 mg daily

## 2022-05-10 NOTE — Progress Notes (Signed)
Subjective:    Patient ID: Andrea Cooper, female    DOB: 03-08-1950, 72 y.o.   MRN: [id number]  HPI Pt presents for f/u of DM2, HTN and chronic medical problems     Wt Readings from Last 3 Encounters:  05/10/22 161 lb (73 kg)  03/27/22 155 lb (70.3 kg)  11/08/21 170 lb 6 oz (77.3 kg)   30.42 kg/m  Vitals:   05/10/22 0902  BP: 122/70  Pulse: 73  Temp: 97.8 F (36.6 C)  SpO2: 97%   Lost her dog recently   Feeling good physically  Not much exercise  She bought some weights to start  Has exercise bands   Health mt wise Tetanus shot 2007  Mammogram 01/2021 Self breast exam: no lumps   HTN bp is stable today  No cp or palpitations or headaches or edema  No side effects to medicines  BP Readings from Last 3 Encounters:  05/10/22 122/70  11/08/21 116/68  08/08/21 128/68    Losartan 100 mg daily  Amlodipine 10 mg daily  Hctz 25 mg daily  Propanolol ER 120 mg daily   Last metabolic panel Lab Results  Component Value Date   GLUCOSE 108 (H) 05/03/2022   NA 142 05/03/2022   K 4.2 05/03/2022   CL 101 05/03/2022   CO2 31 05/03/2022   BUN 18 05/03/2022   CREATININE 0.76 05/03/2022   GFRNONAA >60 01/30/2019   CALCIUM 9.9 05/03/2022   PHOS 3.0 01/04/2010   PROT 6.5 05/03/2022   ALBUMIN 4.2 05/03/2022   BILITOT 0.6 05/03/2022   ALKPHOS 40 05/03/2022   AST 15 05/03/2022   ALT 16 05/03/2022   ANIONGAP 11 01/30/2019     DM2 Lab Results  Component Value Date   HGBA1C 5.1 05/03/2022   Semaglutide 0.5 mg weekly Wants to try going up again  Metformin 500 mg bid    Eating well  Avoiding sugar/added    Eye exam utd  On arb and statin  Good foot care   Hyperlipidemia Lab Results  Component Value Date   CHOL 165 05/03/2022   CHOL 152 10/25/2021   CHOL 178 05/05/2021   Lab Results  Component Value Date   HDL 67.20 05/03/2022   HDL 54.10 10/25/2021   HDL 59.20 05/05/2021   Lab Results  Component Value Date   LDLCALC 81 05/03/2022   LDLCALC  77 10/25/2021   LDLCALC 101 (H) 05/05/2021   Lab Results  Component Value Date   TRIG 81.0 05/03/2022   TRIG 103.0 10/25/2021   TRIG 92.0 05/05/2021   Lab Results  Component Value Date   CHOLHDL 2 05/03/2022   CHOLHDL 3 10/25/2021   CHOLHDL 3 05/05/2021   Lab Results  Component Value Date   LDLDIRECT 160.7 01/14/2007   LDLDIRECT 202.7 08/01/2006   Pravastatin 20 mg daily LDL of 81 HDL in 60s  Ratio has improved   GERD on ppi Lab Results  Component Value Date   VITAMINB12 >1500 (H) 05/03/2022   Improved with oral supplementation   Hypothyroidism  Pt has no clinical changes No change in energy level/ hair or skin/ edema and no tremor Lab Results  Component Value Date   TSH 0.42 05/03/2022    Taking levothy 137 mcg Refilled   . Patient Active Problem List   Diagnosis Date Noted   Current use of proton pump inhibitor 03/23/2020   Allergic dermatitis 01/07/2018   Hearing loss 02/14/2016   Estrogen deficiency 02/14/2016   GERD (  gastroesophageal reflux disease) 08/13/2015   Encounter for Medicare annual wellness exam 07/14/2014   Hypokalemia 12/15/2013   Encounter for screening mammogram for breast cancer 06/07/2011   SLEEP APNEA 09/13/2009   Class 1 obesity due to excess calories with serious comorbidity and body mass index (BMI) of 32.0 to 32.9 in adult 01/13/2009   SWEATING 07/15/2008   Diabetes type 2, controlled (HCC) 05/22/2007   TOBACCO USE, QUIT 05/22/2007   CEREBRAL ANEURYSM 08/09/2006   Hypothyroidism 07/25/2006   Hyperlipidemia associated with type 2 diabetes mellitus 07/25/2006   ANXIETY 07/25/2006   Depression with anxiety 07/25/2006   Essential hypertension 07/25/2006   IBS 07/25/2006   Polyphagia 07/25/2006   SKIN CANCER, HX OF 07/25/2006   Past Medical History:  Diagnosis Date   Anxiety    Cataract 2019   corrected with surgery   Depression    Diabetes mellitus without complication    GERD (gastroesophageal reflux disease)     History of anxiety    History of cerebral aneurysm repair    history of cerebral aneurysm    History of depression    History of hyperglycemia    History of hyperlipidemia    History of hypothyroidism    History of melanoma    History of motion sickness    History of skin cancer    hx of melanoma left lower arm, recurrance   Hypercholesterolemia    Hypertension    Hypothyroidism    IBS (irritable bowel syndrome)    Nocturnal oxygen desaturation    PONV (postoperative nausea and vomiting)    Thyroid disease    Past Surgical History:  Procedure Laterality Date   ABDOMINAL HYSTERECTOMY     APPENDECTOMY  1966   BREAST BIOPSY Left 07-29-14    core bx FIBROCYSTIC CHANGES WITH FEW MICROCALCIFICATIONS.   cardiolite  12/1997   normal EF 68%   CATARACT EXTRACTION W/ INTRAOCULAR LENS  IMPLANT, BILATERAL Bilateral 04/2017   [facility]   CEREBRAL ANEURYSM REPAIR  2008, 2009   coiling at [facility]   COLONOSCOPY WITH PROPOFOL N/A 01/22/2018   Procedure: COLONOSCOPY WITH PROPOFOL;  Surgeon: Toledo, Boykin Nearing, MD;  Location: [facility] ENDOSCOPY;  Service: Gastroenterology;  Laterality: N/A;   EYE SURGERY     RR   FOOT SURGERY  02/1998   bilateral   INTRAOPERATIVE ARTERIOGRAM  10/17/2015   MOHS SURGERY  1992   TOE SURGERY Left 2015   TONSILLECTOMY  1964   TOTAL ABDOMINAL HYSTERECTOMY W/ BILATERAL SALPINGOOPHORECTOMY     Social History   Tobacco Use   Smoking status: Former   Smokeless tobacco: Never  Vaping Use   Vaping Use: Never used  Substance Use Topics   Alcohol use: Yes    Alcohol/week: 1.0 standard drink of alcohol    Types: 1 Standard drinks or equivalent per week    Comment: occasional wine   Drug use: No   Family History  Problem Relation Age of Onset   Diabetes Father    Depression Father    CAD Father    Diabetes Mother    Diverticulosis Mother    Adrenal disorder Mother    Breast cancer Other        distant relatives (all on mother's side)   Cerebral aneurysm  Sister    Allergies  Allergen Reactions   Codeine Nausea And Vomiting    REACTION: nausea and vomiting   Lisinopril     Cough/throat symptoms    Latex Rash   Current  Outpatient Medications on File Prior to Visit  Medication Sig Dispense Refill   amLODipine (NORVASC) 10 MG tablet TAKE 1 TABLET DAILY 90 tablet 0   aspirin EC 81 MG tablet Take 81 mg by mouth daily.     buPROPion (WELLBUTRIN XL) 300 MG 24 hr tablet TAKE 1 TABLET DAILY 90 tablet 0   calcium carbonate (CALCIUM 600) 600 MG TABS tablet Take 1 tablet (600 mg total) by mouth 2 (two) times daily. 180 tablet 3   Cholecalciferol (VITAMIN D3) 50 MCG (2000 UT) capsule Take 1 capsule (2,000 Units total) by mouth daily. 90 capsule 3   FLUoxetine (PROZAC) 40 MG capsule TAKE 2 CAPSULES DAILY 180 capsule 0   glucose blood (ONETOUCH ULTRA) test strip USE 1 STRIP TO CHECK GLUCOSE ONCE DAILYH AS DIRECTED (daignosis code: E11.9) 100 each 1   Homeopathic Products (ZICAM COLD REMEDY PO) Take by mouth.     hydrochlorothiazide (HYDRODIURIL) 25 MG tablet TAKE 1 TABLET DAILY 90 tablet 0   hyoscyamine (LEVSIN SL) 0.125 MG SL tablet Place 0.125 mg under the tongue every 4 (four) hours as needed.     levothyroxine (SYNTHROID) 137 MCG tablet TAKE 1 TABLET DAILY BEFORE BREAKFAST 90 tablet 3   losartan (COZAAR) 100 MG tablet TAKE 1 TABLET DAILY 90 tablet 0   metFORMIN (GLUCOPHAGE) 500 MG tablet TAKE 1 TABLET TWICE A DAY WITH MEALS 180 tablet 0   omeprazole (PRILOSEC) 20 MG capsule TAKE 1 CAPSULE DAILY 90 capsule 0   OneTouch Delica Lancets 33G MISC Check blood sugar twice a day and as directed. Dx E11.9 200 each 1   potassium chloride (KLOR-CON) 10 MEQ tablet TAKE 1 TABLET DAILY 90 tablet 0   pravastatin (PRAVACHOL) 20 MG tablet TAKE 1 TABLET DAILY 90 tablet 0   propranolol ER (INDERAL LA) 120 MG 24 hr capsule TAKE 1 CAPSULE DAILY 90 capsule 0   No current facility-administered medications on file prior to visit.     Review of Systems   Constitutional:  Negative for activity change, appetite change, fatigue, fever and unexpected weight change.  HENT:  Negative for congestion, ear pain, rhinorrhea, sinus pressure and sore throat.   Eyes:  Negative for pain, redness and visual disturbance.  Respiratory:  Negative for cough, shortness of breath and wheezing.   Cardiovascular:  Negative for chest pain and palpitations.  Gastrointestinal:  Negative for abdominal pain, blood in stool, constipation and diarrhea.  Endocrine: Negative for polydipsia and polyuria.  Genitourinary:  Negative for dysuria, frequency and urgency.  Musculoskeletal:  Negative for arthralgias, back pain and myalgias.  Skin:  Negative for pallor and rash.  Allergic/Immunologic: Negative for environmental allergies.  Neurological:  Negative for dizziness, syncope and headaches.  Hematological:  Negative for adenopathy. Does not bruise/bleed easily.  Psychiatric/Behavioral:  Negative for decreased concentration and dysphoric mood. The patient is not nervous/anxious.        Objective:   Physical Exam Constitutional:      General: She is not in acute distress.    Appearance: Normal appearance. She is well-developed. She is obese. She is not ill-appearing or diaphoretic.  HENT:     Head: Normocephalic and atraumatic.     Mouth/Throat:     Mouth: Mucous membranes are moist.  Eyes:     General: No scleral icterus.    Conjunctiva/sclera: Conjunctivae normal.     Pupils: Pupils are equal, round, and reactive to light.  Neck:     Thyroid: No thyromegaly.     Vascular:  No carotid bruit or JVD.  Cardiovascular:     Rate and Rhythm: Normal rate and regular rhythm.     Heart sounds: Normal heart sounds.     No gallop.  Pulmonary:     Effort: Pulmonary effort is normal. No respiratory distress.     Breath sounds: Normal breath sounds. No wheezing or rales.  Abdominal:     General: There is no distension or abdominal bruit.     Palpations: Abdomen is soft.   Musculoskeletal:     Cervical back: Normal range of motion and neck supple.     Right lower leg: No edema.     Left lower leg: No edema.  Lymphadenopathy:     Cervical: No cervical adenopathy.  Skin:    General: Skin is warm and dry.     Coloration: Skin is not pale.     Findings: No rash.  Neurological:     Mental Status: She is alert.     Coordination: Coordination normal.     Deep Tendon Reflexes: Reflexes are normal and symmetric. Reflexes normal.  Psychiatric:        Mood and Affect: Mood normal.           Assessment & Plan:   Problem List Items Addressed This Visit       Cardiovascular and Mediastinum   Essential hypertension    bp in fair control at this time  BP Readings from Last 1 Encounters:  05/10/22 122/70  No changes needed Most recent labs reviewed  Disc lifstyle change with low sodium diet and exercise  Losartan 100 mg daily  Amlodipine 10 mg daily  Hctz 25 mg daily  Propranolol ER 120 mg daily       Relevant Medications   amLODipine (NORVASC) 10 MG tablet   hydrochlorothiazide (HYDRODIURIL) 25 MG tablet   losartan (COZAAR) 100 MG tablet   pravastatin (PRAVACHOL) 20 MG tablet   propranolol ER (INDERAL LA) 120 MG 24 hr capsule     Digestive   GERD (gastroesophageal reflux disease)    On ppi so watching B12 level      Relevant Medications   omeprazole (PRILOSEC) 20 MG capsule     Endocrine   Diabetes type 2, controlled (HCC) - Primary    Lab Results  Component Value Date   HGBA1C 5.1 05/03/2022  Very well controlled  Tolerates semaglutide 0.5 mg weekly -now wants to try going up to 1 again (if that works out can start doing with mail order) Metformin 500 mg bid  Eye exam is utd  On arb and statin  microalb ordered  Good diet and enc more exercise  F/u in 6 mo      Relevant Medications   Semaglutide, 1 MG/DOSE, 4 MG/3ML SOPN   losartan (COZAAR) 100 MG tablet   metFORMIN (GLUCOPHAGE) 500 MG tablet   pravastatin (PRAVACHOL) 20  MG tablet   Other Relevant Orders   Microalbumin / creatinine urine ratio   Hyperlipidemia associated with type 2 diabetes mellitus    Disc goals for lipids and reasons to control them Rev last labs with pt Rev low sat fat diet in detail Improved LDL of 81 with pravastatin 20 mg daily , HDL went up to 67 as well Tolerates this Diet is improved Commended       Relevant Medications   Semaglutide, 1 MG/DOSE, 4 MG/3ML SOPN   amLODipine (NORVASC) 10 MG tablet   hydrochlorothiazide (HYDRODIURIL) 25 MG tablet   losartan (COZAAR) 100  MG tablet   metFORMIN (GLUCOPHAGE) 500 MG tablet   pravastatin (PRAVACHOL) 20 MG tablet   propranolol ER (INDERAL LA) 120 MG 24 hr capsule   Hypothyroidism    Hypothyroidism  Pt has no clinical changes No change in energy level/ hair or skin/ edema and no tremor Lab Results  Component Value Date   TSH 0.42 05/03/2022    Plan to continue levothyroxine 137 mcg daily       Relevant Medications   levothyroxine (SYNTHROID) 137 MCG tablet   propranolol ER (INDERAL LA) 120 MG 24 hr capsule     Other   Encounter for screening mammogram for breast cancer    Mammogram ordered Pt will call to schedule      Relevant Orders   MM 3D SCREENING MAMMOGRAM BILATERAL BREAST

## 2022-05-10 NOTE — Assessment & Plan Note (Signed)
Lab Results  Component Value Date   HGBA1C 5.1 05/03/2022   Very well controlled  Tolerates semaglutide 0.5 mg weekly -now wants to try going up to 1 again (if that works out can start doing with mail order) Metformin 500 mg bid  Eye exam is utd  On arb and statin  microalb ordered  Good diet and enc more exercise  F/u in 6 mo

## 2022-05-10 NOTE — Assessment & Plan Note (Signed)
Disc goals for lipids and reasons to control them Rev last labs with pt Rev low sat fat diet in detail Improved LDL of 81 with pravastatin 20 mg daily , HDL went up to 67 as well Tolerates this Diet is improved Commended

## 2022-05-11 ENCOUNTER — Encounter: Payer: Self-pay | Admitting: *Deleted

## 2022-05-12 DIAGNOSIS — Z8679 Personal history of other diseases of the circulatory system: Secondary | ICD-10-CM | POA: Diagnosis not present

## 2022-05-12 DIAGNOSIS — Z9889 Other specified postprocedural states: Secondary | ICD-10-CM | POA: Diagnosis not present

## 2022-05-12 DIAGNOSIS — I671 Cerebral aneurysm, nonruptured: Secondary | ICD-10-CM | POA: Diagnosis not present

## 2022-06-05 ENCOUNTER — Ambulatory Visit
Admission: RE | Admit: 2022-06-05 | Discharge: 2022-06-05 | Disposition: A | Discharge status: Home / Self Care | Payer: Medicare Other | Source: Ambulatory Visit | Attending: Family Medicine | Admitting: Family Medicine

## 2022-06-05 DIAGNOSIS — Z1231 Encounter for screening mammogram for malignant neoplasm of breast: Secondary | ICD-10-CM | POA: Diagnosis not present

## 2022-08-08 ENCOUNTER — Other Ambulatory Visit: Payer: Self-pay | Admitting: Family Medicine

## 2022-08-08 NOTE — Telephone Encounter (Signed)
Not on med list because Rx expired but last filled on 05/10/22 #3 mL with 0 refills

## 2022-09-24 ENCOUNTER — Other Ambulatory Visit: Payer: Self-pay | Admitting: Family Medicine

## 2022-09-25 NOTE — Telephone Encounter (Signed)
Last filled on 08/08/22 #3 mL/ 0 refills, last OV was a 6 month f/u on 05/10/22

## 2022-09-25 NOTE — Telephone Encounter (Signed)
I noted last time if she tolerates this dose (sounds like she did) she may want to transfer to mail order  Please ask if she wants to do that  Thanks

## 2022-09-26 NOTE — Telephone Encounter (Signed)
Left VM requesting pt to call the office back 

## 2022-09-26 NOTE — Telephone Encounter (Signed)
Pt is tolerating med well, she isn't having any issues but she does want to keep it at [facility] her Mail order will only fill it for 3 months and it's $800 where the [facility] will fill it for a month which is more affordable for her

## 2022-09-27 DIAGNOSIS — Z23 Encounter for immunization: Secondary | ICD-10-CM | POA: Diagnosis not present

## 2022-09-28 DIAGNOSIS — Z961 Presence of intraocular lens: Secondary | ICD-10-CM | POA: Diagnosis not present

## 2022-09-28 DIAGNOSIS — H26492 Other secondary cataract, left eye: Secondary | ICD-10-CM | POA: Diagnosis not present

## 2022-09-28 DIAGNOSIS — E119 Type 2 diabetes mellitus without complications: Secondary | ICD-10-CM | POA: Diagnosis not present

## 2022-11-13 DIAGNOSIS — H26492 Other secondary cataract, left eye: Secondary | ICD-10-CM | POA: Diagnosis not present

## 2022-11-27 DIAGNOSIS — L570 Actinic keratosis: Secondary | ICD-10-CM | POA: Diagnosis not present

## 2022-11-30 LAB — HM DIABETES EYE EXAM

## 2023-01-04 ENCOUNTER — Encounter: Payer: Self-pay | Admitting: Family Medicine

## 2023-01-04 NOTE — Telephone Encounter (Signed)
 Care team updated and letter sent for eye exam notes.

## 2023-02-27 ENCOUNTER — Encounter: Payer: Self-pay | Admitting: Family Medicine

## 2023-02-27 DIAGNOSIS — F418 Other specified anxiety disorders: Secondary | ICD-10-CM

## 2023-02-27 NOTE — Telephone Encounter (Signed)
Heads up= I sent an external psychology referral  Thanks !

## 2023-02-27 NOTE — Telephone Encounter (Signed)
Duplicate message.

## 2023-03-16 DIAGNOSIS — D2261 Melanocytic nevi of right upper limb, including shoulder: Secondary | ICD-10-CM | POA: Diagnosis not present

## 2023-03-16 DIAGNOSIS — Z85820 Personal history of malignant melanoma of skin: Secondary | ICD-10-CM | POA: Diagnosis not present

## 2023-03-16 DIAGNOSIS — L820 Inflamed seborrheic keratosis: Secondary | ICD-10-CM | POA: Diagnosis not present

## 2023-03-16 DIAGNOSIS — D2262 Melanocytic nevi of left upper limb, including shoulder: Secondary | ICD-10-CM | POA: Diagnosis not present

## 2023-03-16 DIAGNOSIS — D225 Melanocytic nevi of trunk: Secondary | ICD-10-CM | POA: Diagnosis not present

## 2023-03-16 DIAGNOSIS — D2372 Other benign neoplasm of skin of left lower limb, including hip: Secondary | ICD-10-CM | POA: Diagnosis not present

## 2023-03-16 DIAGNOSIS — D2272 Melanocytic nevi of left lower limb, including hip: Secondary | ICD-10-CM | POA: Diagnosis not present

## 2023-03-16 DIAGNOSIS — Z85828 Personal history of other malignant neoplasm of skin: Secondary | ICD-10-CM | POA: Diagnosis not present

## 2023-03-16 DIAGNOSIS — D485 Neoplasm of uncertain behavior of skin: Secondary | ICD-10-CM | POA: Diagnosis not present

## 2023-03-20 ENCOUNTER — Other Ambulatory Visit: Payer: Self-pay | Admitting: Family Medicine

## 2023-03-20 NOTE — Telephone Encounter (Signed)
 Patient is overdue for a diabetes f/u. Please call and schedule patient then route back for refill request. Thanks!

## 2023-03-20 NOTE — Telephone Encounter (Signed)
 Patient has been scheduled

## 2023-03-20 NOTE — Telephone Encounter (Signed)
 Last filled on 09/26/22 #3 mL/ 3 refills, last OV was a 6 month f/u on 05/10/22 NOV 03/27/23

## 2023-03-27 ENCOUNTER — Encounter: Payer: Self-pay | Admitting: Family Medicine

## 2023-03-27 ENCOUNTER — Ambulatory Visit (INDEPENDENT_AMBULATORY_CARE_PROVIDER_SITE_OTHER): Admitting: Family Medicine

## 2023-03-27 VITALS — BP 118/70 | HR 81 | Temp 97.7°F | Ht 61.0 in | Wt 156.4 lb

## 2023-03-27 DIAGNOSIS — Z7985 Long-term (current) use of injectable non-insulin antidiabetic drugs: Secondary | ICD-10-CM

## 2023-03-27 DIAGNOSIS — Z7984 Long term (current) use of oral hypoglycemic drugs: Secondary | ICD-10-CM | POA: Diagnosis not present

## 2023-03-27 DIAGNOSIS — F418 Other specified anxiety disorders: Secondary | ICD-10-CM

## 2023-03-27 DIAGNOSIS — I10 Essential (primary) hypertension: Secondary | ICD-10-CM | POA: Diagnosis not present

## 2023-03-27 DIAGNOSIS — E119 Type 2 diabetes mellitus without complications: Secondary | ICD-10-CM

## 2023-03-27 DIAGNOSIS — E785 Hyperlipidemia, unspecified: Secondary | ICD-10-CM | POA: Diagnosis not present

## 2023-03-27 DIAGNOSIS — E1169 Type 2 diabetes mellitus with other specified complication: Secondary | ICD-10-CM | POA: Diagnosis not present

## 2023-03-27 LAB — POCT GLYCOSYLATED HEMOGLOBIN (HGB A1C): Hemoglobin A1C: 5.3 % (ref 4.0–5.6)

## 2023-03-27 NOTE — Patient Instructions (Addendum)
 Add some strength training to your routine, this is important for bone and brain health and can reduce your risk of falls and help your body use insulin properly and regulate weight  Light weights, exercise bands , and internet videos are a good way to start  Yoga (chair or regular), machines , floor exercises or a gym with machines are also good options   Make sure you eat enough protein The following are examples of protein in diet  Meat  Fish  Eggs  Dairy products  Soy products  Oat milk  Almond milk Nuts and nut butters  Dried beans   I think RSV is a good vaccine to get  Get it at the pharmacy   Also get your tetanus vaccine   I do recommend you get back in for counseling as well   Schedule your annual exam with me after 4/24

## 2023-03-27 NOTE — Assessment & Plan Note (Signed)
 More situational stress and bad weather worsened symptoms  Referral made to counseling last week  Doing a little better Plan to continue Fluoxetine 80 mg daily  Wellbutrin xl 300 mg daily   Reviewed stressors/ coping techniques/symptoms/ support sources/ tx options and side effects in detail today

## 2023-03-27 NOTE — Assessment & Plan Note (Signed)
 Disc goals for lipids and reasons to control them Rev last labs with pt Rev low sat fat diet in detail Improved LDL of 81 with pravastatin 20 mg daily , HDL went up to 67 as well Tolerates this Diet is improved Commended   Plan lab and follow up for annual exam

## 2023-03-27 NOTE — Progress Notes (Signed)
 Subjective:    Patient ID: Andrea Cooper, female    DOB: May 04, 1950, 73 y.o.   MRN: [id number]  HPI  Wt Readings from Last 3 Encounters:  03/27/23 156 lb 6 oz (70.9 kg)  05/10/22 161 lb (73 kg)  03/27/22 155 lb (70.3 kg)   29.55 kg/m  Vitals:   03/27/23 1111 03/27/23 1130  BP: (!) 146/78 118/70  Pulse: 81   Temp: 97.7 F (36.5 C)   SpO2: 94%     Pt presents for follow up of DM2 and chronic health problems incl HTN, lipids , hypothyroid  Also worsened anx lately Immunization questions about RSV and Td  HTN  (has not taken med today)  bp is stable today  No cp or palpitations or headaches or edema  No side effects to medicines  BP Readings from Last 3 Encounters:  03/27/23 118/70  05/10/22 122/70  11/08/21 116/68   Losartan 100 mg daily  Amlodipine 10 mg daily  Hctz 25 mg daily  Propranolol ER 120 mg daily    Lab Results  Component Value Date   NA 142 05/03/2022   K 4.2 05/03/2022   CO2 31 05/03/2022   GLUCOSE 108 (H) 05/03/2022   BUN 18 05/03/2022   CREATININE 0.76 05/03/2022   CALCIUM 9.9 05/03/2022   GFR 77.77 05/03/2022   GFRNONAA >60 01/30/2019     DM2 Lab Results  Component Value Date   HGBA1C 5.3 03/27/2023   HGBA1C 5.1 05/03/2022   HGBA1C 5.2 10/25/2021  5.3 today   Semaglutide 1 mg weekly  Is very expensive  Metformin 500 mg bid   Using exercise bands and doing some resistance    Eye exam 11/2022   Lab Results  Component Value Date   MICROALBUR 3.3 (H) 05/10/2022   MICROALBUR 6.4 (H) 05/05/2021  Last ratio was 25   Hyperlipidemia Lab Results  Component Value Date   CHOL 165 05/03/2022   HDL 67.20 05/03/2022   LDLCALC 81 05/03/2022   LDLDIRECT 160.7 01/14/2007   TRIG 81.0 05/03/2022   CHOLHDL 2 05/03/2022   Pravastatin 20 mg daily   Hypothyroidism  Pt has no clinical changes No change in energy level/ hair or skin/ edema and no tremor Lab Results  Component Value Date   TSH 0.42 05/03/2022    Levothyroxine 137  mcg daily   Is taking nutrafol for hair     Takes fluoxetine for mood 80 mg daily  Wellbutrin xl 300 mg daily       03/27/2022    8:31 AM 03/27/2022    8:30 AM 06/21/2021    2:09 PM 03/24/2021    8:24 AM 03/23/2020    8:20 AM  Depression screen PHQ 2/9  Decreased Interest 0 0 0 0 1  Down, Depressed, Hopeless 0 0 0 0 1  PHQ - 2 Score 0 0 0 0 2  Altered sleeping     0  Tired, decreased energy     0  Change in appetite     0  Feeling bad or failure about yourself      0  Trouble concentrating     0  Moving slowly or fidgety/restless     0  Suicidal thoughts     0  PHQ-9 Score     2  Difficult doing work/chores     Not difficult at all   We placed a referral to counseling Montey Hora who she saw before)  She filled out paperwork to  get appointment   Not in danger at all  Stress- reached out to her ex husband and that was a mistake      Patient Active Problem List   Diagnosis Date Noted   Current use of proton pump inhibitor 03/23/2020   Allergic dermatitis 01/07/2018   Hearing loss 02/14/2016   Estrogen deficiency 02/14/2016   GERD (gastroesophageal reflux disease) 08/13/2015   Encounter for Medicare annual wellness exam 07/14/2014   Hypokalemia 12/15/2013   Encounter for screening mammogram for breast cancer 06/07/2011   Sleep apnea 09/13/2009   Overweight 01/13/2009   SWEATING 07/15/2008   Diabetes type 2, controlled (HCC) 05/22/2007   TOBACCO USE, QUIT 05/22/2007   CEREBRAL ANEURYSM 08/09/2006   Hypothyroidism 07/25/2006   Hyperlipidemia associated with type 2 diabetes mellitus (HCC) 07/25/2006   Anxiety state 07/25/2006   Depression with anxiety 07/25/2006   Essential hypertension 07/25/2006   IBS 07/25/2006   Polyphagia 07/25/2006   SKIN CANCER, HX OF 07/25/2006   Past Medical History:  Diagnosis Date   Anxiety    Cataract 2019   corrected with surgery   Depression    Diabetes mellitus without complication (HCC)    GERD (gastroesophageal reflux disease)     History of anxiety    History of cerebral aneurysm repair    history of cerebral aneurysm    History of depression    History of hyperglycemia    History of hyperlipidemia    History of hypothyroidism    History of melanoma    History of motion sickness    History of skin cancer    hx of melanoma left lower arm, recurrance   Hypercholesterolemia    Hypertension    Hypothyroidism    IBS (irritable bowel syndrome)    Nocturnal oxygen desaturation    PONV (postoperative nausea and vomiting)    Thyroid disease    Past Surgical History:  Procedure Laterality Date   ABDOMINAL HYSTERECTOMY     APPENDECTOMY  1966   BREAST BIOPSY Left 07-29-14    core bx FIBROCYSTIC CHANGES WITH FEW MICROCALCIFICATIONS.   cardiolite  12/1997   normal EF 68%   CATARACT EXTRACTION W/ INTRAOCULAR LENS  IMPLANT, BILATERAL Bilateral 04/2017   [facility]   CEREBRAL ANEURYSM REPAIR  2008, 2009   coiling at [facility]   COLONOSCOPY WITH PROPOFOL N/A 01/22/2018   Procedure: COLONOSCOPY WITH PROPOFOL;  Surgeon: Toledo, Boykin Nearing, MD;  Location: [facility] ENDOSCOPY;  Service: Gastroenterology;  Laterality: N/A;   EYE SURGERY     RR   FOOT SURGERY  02/1998   bilateral   INTRAOPERATIVE ARTERIOGRAM  10/17/2015   MOHS SURGERY  1992   TOE SURGERY Left 2015   TONSILLECTOMY  1964   TOTAL ABDOMINAL HYSTERECTOMY W/ BILATERAL SALPINGOOPHORECTOMY     Social History   Tobacco Use   Smoking status: Former   Smokeless tobacco: Never  Vaping Use   Vaping status: Never Used  Substance Use Topics   Alcohol use: Yes    Alcohol/week: 1.0 standard drink of alcohol    Types: 1 Standard drinks or equivalent per week    Comment: occasional wine   Drug use: No   Family History  Problem Relation Age of Onset   Diabetes Father    Depression Father    CAD Father    Diabetes Mother    Diverticulosis Mother    Adrenal disorder Mother    Breast cancer Other        distant relatives (all  on mother's side)   Cerebral  aneurysm Sister    Allergies  Allergen Reactions   Codeine Nausea And Vomiting    REACTION: nausea and vomiting   Lisinopril     Cough/throat symptoms    Latex Rash   Current Outpatient Medications on File Prior to Visit  Medication Sig Dispense Refill   amLODipine (NORVASC) 10 MG tablet Take 1 tablet (10 mg total) by mouth daily. 90 tablet 3   aspirin EC 81 MG tablet Take 81 mg by mouth daily.     buPROPion (WELLBUTRIN XL) 300 MG 24 hr tablet Take 1 tablet (300 mg total) by mouth daily. 90 tablet 3   calcium carbonate (CALCIUM 600) 600 MG TABS tablet Take 1 tablet (600 mg total) by mouth 2 (two) times daily. 180 tablet 3   Cholecalciferol (VITAMIN D3) 50 MCG (2000 UT) capsule Take 1 capsule (2,000 Units total) by mouth daily. 90 capsule 3   FLUoxetine (PROZAC) 40 MG capsule Take 2 capsules (80 mg total) by mouth daily. 180 capsule 3   glucose blood (ONETOUCH ULTRA) test strip USE 1 STRIP TO CHECK GLUCOSE ONCE DAILYH AS DIRECTED (daignosis code: E11.9) 100 each 1   Homeopathic Products (ZICAM COLD REMEDY PO) Take by mouth.     hydrochlorothiazide (HYDRODIURIL) 25 MG tablet Take 1 tablet (25 mg total) by mouth daily. 90 tablet 3   hyoscyamine (LEVSIN SL) 0.125 MG SL tablet Place 0.125 mg under the tongue every 4 (four) hours as needed.     levothyroxine (SYNTHROID) 137 MCG tablet TAKE 1 TABLET DAILY BEFORE BREAKFAST 90 tablet 3   losartan (COZAAR) 100 MG tablet Take 1 tablet (100 mg total) by mouth daily. 90 tablet 3   metFORMIN (GLUCOPHAGE) 500 MG tablet Take 1 tablet (500 mg total) by mouth 2 (two) times daily with a meal. 180 tablet 3   omeprazole (PRILOSEC) 20 MG capsule Take 1 capsule (20 mg total) by mouth daily. 90 capsule 3   OneTouch Delica Lancets 33G MISC Check blood sugar twice a day and as directed. Dx E11.9 200 each 1   OZEMPIC, 1 MG/DOSE, 4 MG/3ML SOPN INJECT 1 MG UNDER THE SKIN ONE DAY A WEEK AS DIRECTED 3 mL 3   potassium chloride (KLOR-CON) 10 MEQ tablet Take 1 tablet (10  mEq total) by mouth daily. 90 tablet 3   pravastatin (PRAVACHOL) 20 MG tablet Take 1 tablet (20 mg total) by mouth daily. 90 tablet 3   propranolol ER (INDERAL LA) 120 MG 24 hr capsule Take 1 capsule (120 mg total) by mouth daily. 90 capsule 3   No current facility-administered medications on file prior to visit.    Review of Systems  Constitutional:  Negative for activity change, appetite change, fatigue, fever and unexpected weight change.  HENT:  Negative for congestion, ear pain, rhinorrhea, sinus pressure and sore throat.   Eyes:  Negative for pain, redness and visual disturbance.  Respiratory:  Negative for cough, shortness of breath and wheezing.   Cardiovascular:  Negative for chest pain and palpitations.  Gastrointestinal:  Negative for abdominal pain, blood in stool, constipation and diarrhea.  Endocrine: Negative for polydipsia and polyuria.  Genitourinary:  Negative for dysuria, frequency and urgency.  Musculoskeletal:  Negative for arthralgias, back pain and myalgias.  Skin:  Negative for pallor and rash.  Allergic/Immunologic: Negative for environmental allergies.  Neurological:  Negative for dizziness, syncope and headaches.  Hematological:  Negative for adenopathy. Does not bruise/bleed easily.  Psychiatric/Behavioral:  Positive for  dysphoric mood. Negative for decreased concentration. The patient is nervous/anxious.        Objective:   Physical Exam Constitutional:      General: She is not in acute distress.    Appearance: Normal appearance. She is well-developed. She is not ill-appearing or diaphoretic.     Comments: Overwt  Weight loss noted   HENT:     Head: Normocephalic and atraumatic.  Eyes:     Conjunctiva/sclera: Conjunctivae normal.     Pupils: Pupils are equal, round, and reactive to light.  Neck:     Thyroid: No thyromegaly.     Vascular: No carotid bruit or JVD.  Cardiovascular:     Rate and Rhythm: Normal rate and regular rhythm.     Heart  sounds: Normal heart sounds.     No gallop.  Pulmonary:     Effort: Pulmonary effort is normal. No respiratory distress.     Breath sounds: Normal breath sounds. No wheezing or rales.  Abdominal:     General: There is no distension or abdominal bruit.     Palpations: Abdomen is soft.  Musculoskeletal:     Cervical back: Normal range of motion and neck supple.     Right lower leg: No edema.     Left lower leg: No edema.  Lymphadenopathy:     Cervical: No cervical adenopathy.  Skin:    General: Skin is warm and dry.     Coloration: Skin is not pale.     Findings: No rash.  Neurological:     Mental Status: She is alert.     Motor: No weakness.     Coordination: Coordination normal.     Deep Tendon Reflexes: Reflexes are normal and symmetric. Reflexes normal.  Psychiatric:        Attention and Perception: Attention normal.        Mood and Affect: Mood normal. Affect is tearful.        Cognition and Memory: Cognition and memory normal.     Comments: Candidly discusses symptoms and stressors             Assessment & Plan:   Problem List Items Addressed This Visit       Cardiovascular and Mediastinum   Essential hypertension   bp in fair control at this time  BP Readings from Last 1 Encounters:  03/27/23 118/70   No changes needed Most recent labs reviewed  Disc lifstyle change with low sodium diet and exercise  Losartan 100 mg daily  Amlodipine 10 mg daily  Hctz 25 mg daily  Propranolol ER 120 mg daily         Endocrine   Hyperlipidemia associated with type 2 diabetes mellitus (HCC)   Disc goals for lipids and reasons to control them Rev last labs with pt Rev low sat fat diet in detail Improved LDL of 81 with pravastatin 20 mg daily , HDL went up to 67 as well Tolerates this Diet is improved Commended   Plan lab and follow up for annual exam       Diabetes type 2, controlled (HCC) - Primary   Lab Results  Component Value Date   HGBA1C 5.3 03/27/2023    HGBA1C 5.1 05/03/2022   HGBA1C 5.2 10/25/2021   Doing well with  Metformin 500 mg bid  Sematlutide 1 mg weekly /also weight loss Strongly encouraged to do strength building exercise to avoid muscle loss Eye exam utd  Microalb utd and ratio is normal  On arb On statin   Planning annual exam      Relevant Orders   POCT HgB A1C (Completed)     Other   Depression with anxiety   More situational stress and bad weather worsened symptoms  Referral made to counseling last week  Doing a little better Plan to continue Fluoxetine 80 mg daily  Wellbutrin xl 300 mg daily   Reviewed stressors/ coping techniques/symptoms/ support sources/ tx options and side effects in detail today

## 2023-03-27 NOTE — Assessment & Plan Note (Signed)
 bp in fair control at this time  BP Readings from Last 1 Encounters:  03/27/23 118/70   No changes needed Most recent labs reviewed  Disc lifstyle change with low sodium diet and exercise  Losartan 100 mg daily  Amlodipine 10 mg daily  Hctz 25 mg daily  Propranolol ER 120 mg daily

## 2023-03-27 NOTE — Assessment & Plan Note (Signed)
 Lab Results  Component Value Date   HGBA1C 5.3 03/27/2023   HGBA1C 5.1 05/03/2022   HGBA1C 5.2 10/25/2021   Doing well with  Metformin 500 mg bid  Sematlutide 1 mg weekly Orinda Kenner weight loss Strongly encouraged to do strength building exercise to avoid muscle loss Eye exam utd  Microalb utd and ratio is normal  On arb On statin   Planning annual exam

## 2023-03-29 ENCOUNTER — Ambulatory Visit: Payer: Medicare Other

## 2023-03-29 VITALS — Ht 61.0 in | Wt 156.0 lb

## 2023-03-29 DIAGNOSIS — Z0000 Encounter for general adult medical examination without abnormal findings: Secondary | ICD-10-CM | POA: Diagnosis not present

## 2023-03-29 DIAGNOSIS — Z1231 Encounter for screening mammogram for malignant neoplasm of breast: Secondary | ICD-10-CM | POA: Diagnosis not present

## 2023-03-29 DIAGNOSIS — H9190 Unspecified hearing loss, unspecified ear: Secondary | ICD-10-CM | POA: Diagnosis not present

## 2023-03-29 NOTE — Progress Notes (Signed)
 Subjective:   Andrea Cooper is a 73 y.o. who presents for a Medicare Wellness preventive visit.  Visit Complete: Virtual I connected with  Andrea Cooper on 03/29/23 by a audio enabled telemedicine application and verified that I am speaking with the correct person using two identifiers.  Patient Location: Home  Provider Location: Home Office  I discussed the limitations of evaluation and management by telemedicine. The patient expressed understanding and agreed to proceed.  Vital Signs: Because this visit was a virtual/telehealth visit, some criteria may be missing or patient reported. Any vitals not documented were not able to be obtained and vitals that have been documented are patient reported.  VideoDeclined- This patient declined Librarian, academic. Therefore the visit was completed with audio only.  AWV Questionnaire: Yes: Patient Medicare AWV questionnaire was completed by the patient on 03/22/2023; I have confirmed that all information answered by patient is correct and no changes since this date.  Cardiac Risk Factors include: advanced age (>41men, >82 women);diabetes mellitus;dyslipidemia;hypertension     Objective:    Today's Vitals   03/29/23 0933  Weight: 156 lb (70.8 kg)  Height: 5\' 1"  (1.549 m)   Body mass index is 29.48 kg/m.     03/29/2023    9:46 AM 03/27/2022    8:31 AM 03/24/2021    8:20 AM 03/23/2020    8:18 AM 01/31/2019   12:05 PM 01/25/2018    8:56 AM 01/22/2018    7:09 AM  Advanced Directives  Does Patient Have a Medical Advance Directive? Yes Yes No Yes Yes Yes Yes  Type of Estate agent of [place];Living will Healthcare Power of [place];Living will  Healthcare Power of [place];Living will Healthcare Power of [place];Living will Healthcare Power of [place];Living will   Copy of Healthcare Power of Attorney in Chart? No - copy requested No - copy requested  No - copy requested No - copy requested No -  copy requested   Would patient like information on creating a medical advance directive?   Yes (MAU/Ambulatory/Procedural Areas - Information given)        Current Medications (verified) Outpatient Encounter Medications as of 03/29/2023  Medication Sig   amLODipine (NORVASC) 10 MG tablet Take 1 tablet (10 mg total) by mouth daily.   aspirin EC 81 MG tablet Take 81 mg by mouth daily.   buPROPion (WELLBUTRIN XL) 300 MG 24 hr tablet Take 1 tablet (300 mg total) by mouth daily.   calcium carbonate (CALCIUM 600) 600 MG TABS tablet Take 1 tablet (600 mg total) by mouth 2 (two) times daily.   Cholecalciferol (VITAMIN D3) 50 MCG (2000 UT) capsule Take 1 capsule (2,000 Units total) by mouth daily.   FLUoxetine (PROZAC) 40 MG capsule Take 2 capsules (80 mg total) by mouth daily.   glucose blood (ONETOUCH ULTRA) test strip USE 1 STRIP TO CHECK GLUCOSE ONCE DAILYH AS DIRECTED (daignosis code: E11.9)   Homeopathic Products (ZICAM COLD REMEDY PO) Take by mouth.   hydrochlorothiazide (HYDRODIURIL) 25 MG tablet Take 1 tablet (25 mg total) by mouth daily.   hyoscyamine (LEVSIN SL) 0.125 MG SL tablet Place 0.125 mg under the tongue every 4 (four) hours as needed.   levothyroxine (SYNTHROID) 137 MCG tablet TAKE 1 TABLET DAILY BEFORE BREAKFAST   losartan (COZAAR) 100 MG tablet Take 1 tablet (100 mg total) by mouth daily.   metFORMIN (GLUCOPHAGE) 500 MG tablet Take 1 tablet (500 mg total) by mouth 2 (two) times daily with a  meal.   omeprazole (PRILOSEC) 20 MG capsule Take 1 capsule (20 mg total) by mouth daily.   OneTouch Delica Lancets 33G MISC Check blood sugar twice a day and as directed. Dx E11.9   OZEMPIC, 1 MG/DOSE, 4 MG/3ML SOPN INJECT 1 MG UNDER THE SKIN ONE DAY A WEEK AS DIRECTED   potassium chloride (KLOR-CON) 10 MEQ tablet Take 1 tablet (10 mEq total) by mouth daily.   pravastatin (PRAVACHOL) 20 MG tablet Take 1 tablet (20 mg total) by mouth daily.   propranolol ER (INDERAL LA) 120 MG 24 hr capsule  Take 1 capsule (120 mg total) by mouth daily.   No facility-administered encounter medications on file as of 03/29/2023.    Allergies (verified) Codeine, Lisinopril, and Latex   History: Past Medical History:  Diagnosis Date   Anxiety    Cataract 2019   corrected with surgery   Depression    Diabetes mellitus without complication (HCC)    GERD (gastroesophageal reflux disease)    History of anxiety    History of cerebral aneurysm repair    history of cerebral aneurysm    History of depression    History of hyperglycemia    History of hyperlipidemia    History of hypothyroidism    History of melanoma    History of motion sickness    History of skin cancer    hx of melanoma left lower arm, recurrance   Hypercholesterolemia    Hypertension    Hypothyroidism    IBS (irritable bowel syndrome)    Nocturnal oxygen desaturation    PONV (postoperative nausea and vomiting)    Thyroid disease    Past Surgical History:  Procedure Laterality Date   ABDOMINAL HYSTERECTOMY     APPENDECTOMY  1966   BREAST BIOPSY Left 07-29-14    core bx FIBROCYSTIC CHANGES WITH FEW MICROCALCIFICATIONS.   cardiolite  12/1997   normal EF 68%   CATARACT EXTRACTION W/ INTRAOCULAR LENS  IMPLANT, BILATERAL Bilateral 04/2017   [facility]   CEREBRAL ANEURYSM REPAIR  2008, 2009   coiling at [facility]   COLONOSCOPY WITH PROPOFOL N/A 01/22/2018   Procedure: COLONOSCOPY WITH PROPOFOL;  Surgeon: Toledo, Boykin Nearing, MD;  Location: [facility] ENDOSCOPY;  Service: Gastroenterology;  Laterality: N/A;   EYE SURGERY     RR   FOOT SURGERY  02/1998   bilateral   INTRAOPERATIVE ARTERIOGRAM  10/17/2015   MOHS SURGERY  1992   TOE SURGERY Left 2015   TONSILLECTOMY  1964   TOTAL ABDOMINAL HYSTERECTOMY W/ BILATERAL SALPINGOOPHORECTOMY     Family History  Problem Relation Age of Onset   Diabetes Father    Depression Father    CAD Father    Diabetes Mother    Diverticulosis Mother    Adrenal disorder Mother    Breast  cancer Other        distant relatives (all on mother's side)   Cerebral aneurysm Sister    Social History   Socioeconomic History   Marital status: Divorced    Spouse name: Not on file   Number of children: Not on file   Years of education: Not on file   Highest education level: Not on file  Occupational History   Not on file  Tobacco Use   Smoking status: Former   Smokeless tobacco: Never  Vaping Use   Vaping status: Never Used  Substance and Sexual Activity   Alcohol use: Yes    Alcohol/week: 1.0 standard drink of alcohol  Types: 1 Standard drinks or equivalent per week    Comment: occasional wine   Drug use: No   Sexual activity: Never  Other Topics Concern   Not on file  Social History Narrative   Not on file   Social Drivers of Health   Financial Resource Strain: Low Risk  (03/29/2023)   Overall Financial Resource Strain (CARDIA)    Difficulty of Paying Living Expenses: Not hard at all  Food Insecurity: No Food Insecurity (03/29/2023)   Hunger Vital Sign    Worried About Running Out of Food in the Last Year: Never true    Ran Out of Food in the Last Year: Never true  Transportation Needs: No Transportation Needs (03/29/2023)   PRAPARE - Administrator, Civil Service (Medical): No    Lack of Transportation (Non-Medical): No  Physical Activity: Inactive (03/29/2023)   Exercise Vital Sign    Days of Exercise per Week: 0 days    Minutes of Exercise per Session: 0 min  Stress: No Stress Concern Present (03/29/2023)   [employer] of Occupational Health - Occupational Stress Questionnaire    Feeling of Stress : Not at all  Social Connections: Moderately Integrated (03/29/2023)   Social Connection and Isolation Panel [NHANES]    Frequency of Communication with Friends and Family: More than three times a week    Frequency of Social Gatherings with Friends and Family: More than three times a week    Attends Religious Services: More than 4 times per year     Active Member of [employer] or Organizations: Yes    Attends Engineer, structural: More than 4 times per year    Marital Status: Divorced    Tobacco Counseling Counseling given: Not Answered  Clinical Intake:  Pre-visit preparation completed: Yes  Pain : No/denies pain    BMI - recorded: 29.48 Nutritional Status: BMI 25 -29 Overweight Nutritional Risks: None Diabetes: Yes CBG done?: No Did pt. bring in CBG monitor from home?: No  How often do you need to have someone help you when you read instructions, pamphlets, or other written materials from your doctor or pharmacy?: 1 - Never  Interpreter Needed?: No  Comments: lives alone Information entered by :: B.Trip Cavanagh,LPN   Activities of Daily Living     03/22/2023   11:10 AM  In your present state of health, do you have any difficulty performing the following activities:  Hearing? 0  Vision? 1  Difficulty concentrating or making decisions? 0  Walking or climbing stairs? 0  Dressing or bathing? 0  Doing errands, shopping? 0  Preparing Food and eating ? N  Using the Toilet? N  In the past six months, have you accidently leaked urine? N  Do you have problems with loss of bowel control? Y  Managing your Medications? N  Managing your Finances? N  Housekeeping or managing your Housekeeping? N    Patient Care Team: Tower, Audrie Gallus, MD as PCP - General Debbe Odea, MD as PCP - Cardiology (Cardiology) Lemar Livings Merrily Pew, MD (General Surgery) Milagros Evener, MD as Consulting Physician (Psychiatry) Kenn File, MD as Referring Physician (Neurosurgery) Venancio Poisson, MD as Consulting Physician (Dermatology) [facility]  Indicate any recent Medical Services you may have received from other than [facility] providers in the past year (date may be approximate).     Assessment:   This is a routine wellness examination for Amrit.  Hearing/Vision screen Hearing Screening - Comments:: Pt says  she  is having some difficulty hearing Audiology referral Vision Screening - Comments:: Pt says her vision is good [facility]   Goals Addressed             This Visit's Progress    Increase physical activity   Not on track    03/29/23-,I will walk for 20 minutes 3 days per week.      Patient Stated   On track    03/29/2023, I will maintain and continue medications as prescribed.      Patient Stated   On track    03/29/23-Would like to continue eating healthier and drink more water     Patient Stated       03/29/23-Be more active and lose 20lbs this year for wedding       Depression Screen     03/29/2023    9:41 AM 03/27/2022    8:31 AM 03/27/2022    8:30 AM 06/21/2021    2:09 PM 03/24/2021    8:24 AM 03/23/2020    8:20 AM 01/31/2019   12:06 PM  PHQ 2/9 Scores  PHQ - 2 Score 0 0 0 0 0 2 0  PHQ- 9 Score      2 0    Fall Risk     03/22/2023   11:10 AM 03/27/2022    8:32 AM 06/21/2021    2:09 PM 03/24/2021    8:23 AM 03/23/2020    8:20 AM  Fall Risk   Falls in the past year? 0 0  0 0  Number falls in past yr:  0  0 0  Injury with Fall? 0 0  0 0  Risk for fall due to : No Fall Risks No Fall Risks   Medication side effect  Follow up Education provided;Falls prevention discussed Falls prevention discussed;Falls evaluation completed Falls evaluation completed Falls prevention discussed Falls evaluation completed;Falls prevention discussed    MEDICARE RISK AT HOME:  Medicare Risk at Home Any stairs in or around the home?: (Patient-Rptd) Yes If so, are there any without handrails?: (Patient-Rptd) No Home free of loose throw rugs in walkways, pet beds, electrical cords, etc?: (Patient-Rptd) Yes Adequate lighting in your home to reduce risk of falls?: (Patient-Rptd) Yes Life alert?: (Patient-Rptd) No Use of a cane, walker or w/c?: (Patient-Rptd) No Grab bars in the bathroom?: (Patient-Rptd) No Shower chair or bench in shower?: (Patient-Rptd) No Elevated toilet seat or a handicapped  toilet?: (Patient-Rptd) Yes  TIMED UP AND GO:  Was the test performed?  No  Cognitive Function: 6CIT completed    03/23/2020    8:24 AM 01/31/2019   12:08 PM 01/25/2018    9:49 AM 01/19/2017    8:54 AM 01/19/2016    9:57 AM  MMSE - Mini Mental State Exam  Not completed: Refused      Orientation to time  5 5 5 5   Orientation to Place  5 5 5 5   Registration  3 3 3 3   Attention/ Calculation  5 0 0 0  Recall  3 3 3 3   Language- name 2 objects   0 0 0  Language- repeat  1 1 1 1   Language- follow 3 step command   3 3 3   Language- read & follow direction   0 0 0  Write a sentence   0 0 0  Copy design   0 0 0  Total score   20 20 20         03/29/2023  9:48 AM 03/27/2022    8:33 AM  6CIT Screen  What Year? 0 points 0 points  What month? 0 points 0 points  What time? 0 points 0 points  Count back from 20 0 points 0 points  Months in reverse 0 points 0 points  Repeat phrase 0 points 0 points  Total Score 0 points 0 points    Immunizations Immunization History  Administered Date(s) Administered   Fluad Quad(high Dose 65+) 09/28/2018, 11/09/2020   Fluad Trivalent(High Dose 65+) 09/27/2022   Influenza Whole 01/13/2009   Influenza, High Dose Seasonal PF 11/06/2016, 10/24/2017, 09/09/2019   Influenza,inj,Quad PF,6+ Mos 10/07/2013, 12/21/2015   Moderna Covid-19 Vaccine Bivalent Booster 34yrs & up 12/21/2020   PFIZER(Purple Top)SARS-COV-2 Vaccination 03/04/2019, 03/25/2019, 10/30/2019   Pneumococcal Conjugate-13 07/14/2014, 06/20/2017   Pneumococcal Polysaccharide-23 10/01/2008, 02/14/2016, 09/09/2019   Td 01/08/1997, 04/16/2005   Zoster Recombinant(Shingrix) 10/06/2018, 12/06/2018   Zoster, Live 12/15/2013    Screening Tests Health Maintenance  Topic Date Due   DTaP/Tdap/Td (3 - Tdap) 03/26/2024 (Originally 04/17/2015)   COVID-19 Vaccine (5 - 2024-25 season) 04/11/2024 (Originally 09/17/2022)   Diabetic kidney evaluation - eGFR measurement  05/03/2023   Diabetic kidney  evaluation - Urine ACR  05/10/2023   FOOT EXAM  05/10/2023   MAMMOGRAM  06/05/2023   HEMOGLOBIN A1C  09/27/2023   OPHTHALMOLOGY EXAM  11/30/2023   Medicare Annual Wellness (AWV)  03/28/2024   Colonoscopy  01/23/2028   Pneumonia Vaccine 75+ Years old  Completed   INFLUENZA VACCINE  Completed   DEXA SCAN  Completed   Hepatitis C Screening  Completed   Zoster Vaccines- Shingrix  Completed   HPV VACCINES  Aged Out    Health Maintenance  There are no preventive care reminders to display for this patient.  Health Maintenance Items Addressed:none needed   Additional Screening:  Vision Screening: Recommended annual ophthalmology exams for early detection of glaucoma and other disorders of the eye.  Dental Screening: Recommended annual dental exams for proper oral hygiene  Community Resource Referral / Chronic Care Management: CRR required this visit?  No   CCM required this visit?  No    Plan:     I have personally reviewed and noted the following in the patient's chart:   Medical and social history Use of alcohol, tobacco or illicit drugs  Current medications and supplements including opioid prescriptions. Patient is not currently taking opioid prescriptions. Functional ability and status Nutritional status Physical activity Advanced directives List of other physicians Hospitalizations, surgeries, and ER visits in previous 12 months Vitals Screenings to include cognitive, depression, and falls Referrals and appointments  In addition, I have reviewed and discussed with patient certain preventive protocols, quality metrics, and best practice recommendations. A written personalized care plan for preventive services as well as general preventive health recommendations were provided to patient.    Sue Lush, LPN   [id number]   After Visit Summary: (MyChart) Due to this being a telephonic visit, the after visit summary with patients personalized plan was offered to  patient via MyChart   Notes: Nothing significant to report at this time.

## 2023-03-29 NOTE — Patient Instructions (Signed)
 Andrea Cooper , Thank you for taking time to come for your Medicare Wellness Visit. I appreciate your ongoing commitment to your health goals. Please review the following plan we discussed and let me know if I can assist you in the future.   Referrals/Orders/Follow-Ups/Clinician Recommendations:   You have an order for:  []   2D Mammogram  [x]   3D Mammogram  []   Bone Density    Please call for appointment:  [facility]  [address] [place] [address] [phone]   Make sure to wear two-piece clothing.  No lotions, powders, or deodorants the day of the appointment. Make sure to bring picture ID and insurance card.  Bring list of medications you are currently taking including any supplements.    Patient complains of difficulty with hearing. ENT referral placed. Patient is in agreement with treatment plan. Aware that the office will call with an appointment.    This is a list of the screening recommended for you and due dates:  Health Maintenance  Topic Date Due   DTaP/Tdap/Td vaccine (3 - Tdap) 03/26/2024*   COVID-19 Vaccine (5 - 2024-25 season) 04/11/2024*   Yearly kidney function blood test for diabetes  05/03/2023   Yearly kidney health urinalysis for diabetes  05/10/2023   Complete foot exam   05/10/2023   Mammogram  06/05/2023   Hemoglobin A1C  09/27/2023   Eye exam for diabetics  11/30/2023   Medicare Annual Wellness Visit  03/28/2024   Colon Cancer Screening  01/23/2028   Pneumonia Vaccine  Completed   Flu Shot  Completed   DEXA scan (bone density measurement)  Completed   Hepatitis C Screening  Completed   Zoster (Shingles) Vaccine  Completed   HPV Vaccine  Aged Out  *Topic was postponed. The date shown is not the original due date.    Advanced directives: (Copy Requested) Please bring a copy of your health care power of attorney and living will to the office to be added to your chart at your convenience. You  can mail to [facility] [address] [place], [place] [address] or email to [email or web address]  Next Medicare Annual Wellness Visit scheduled for next year: Yes 04/01/2023 @ 9:30am televisit

## 2023-05-06 ENCOUNTER — Telehealth: Payer: Self-pay | Admitting: Family Medicine

## 2023-05-06 DIAGNOSIS — E785 Hyperlipidemia, unspecified: Secondary | ICD-10-CM

## 2023-05-06 DIAGNOSIS — E119 Type 2 diabetes mellitus without complications: Secondary | ICD-10-CM

## 2023-05-06 DIAGNOSIS — Z79899 Other long term (current) drug therapy: Secondary | ICD-10-CM

## 2023-05-06 DIAGNOSIS — E1169 Type 2 diabetes mellitus with other specified complication: Secondary | ICD-10-CM

## 2023-05-06 DIAGNOSIS — I10 Essential (primary) hypertension: Secondary | ICD-10-CM

## 2023-05-06 DIAGNOSIS — E039 Hypothyroidism, unspecified: Secondary | ICD-10-CM

## 2023-05-06 NOTE — Telephone Encounter (Signed)
-----   Message from Gerry Krone sent at 04/23/2023  2:48 PM EDT ----- Regarding: Lab orders for Tue, 4.22.25 Patient is scheduled for CPX labs, please order future labs, Thanks , Anselmo Kings

## 2023-05-08 ENCOUNTER — Telehealth: Payer: Self-pay | Admitting: Family Medicine

## 2023-05-08 ENCOUNTER — Other Ambulatory Visit (INDEPENDENT_AMBULATORY_CARE_PROVIDER_SITE_OTHER)

## 2023-05-08 DIAGNOSIS — I10 Essential (primary) hypertension: Secondary | ICD-10-CM

## 2023-05-08 DIAGNOSIS — Z1231 Encounter for screening mammogram for malignant neoplasm of breast: Secondary | ICD-10-CM

## 2023-05-08 DIAGNOSIS — Z79899 Other long term (current) drug therapy: Secondary | ICD-10-CM

## 2023-05-08 DIAGNOSIS — E039 Hypothyroidism, unspecified: Secondary | ICD-10-CM

## 2023-05-08 DIAGNOSIS — E1169 Type 2 diabetes mellitus with other specified complication: Secondary | ICD-10-CM | POA: Diagnosis not present

## 2023-05-08 DIAGNOSIS — E119 Type 2 diabetes mellitus without complications: Secondary | ICD-10-CM | POA: Diagnosis not present

## 2023-05-08 DIAGNOSIS — E785 Hyperlipidemia, unspecified: Secondary | ICD-10-CM

## 2023-05-08 LAB — CBC WITH DIFFERENTIAL/PLATELET
Basophils Absolute: 0.1 10*3/uL (ref 0.0–0.1)
Basophils Relative: 0.7 % (ref 0.0–3.0)
Eosinophils Absolute: 0.2 10*3/uL (ref 0.0–0.7)
Eosinophils Relative: 2.6 % (ref 0.0–5.0)
HCT: 42.2 % (ref 36.0–46.0)
Hemoglobin: 14.1 g/dL (ref 12.0–15.0)
Lymphocytes Relative: 33.5 % (ref 12.0–46.0)
Lymphs Abs: 2.3 10*3/uL (ref 0.7–4.0)
MCHC: 33.4 g/dL (ref 30.0–36.0)
MCV: 93 fl (ref 78.0–100.0)
Monocytes Absolute: 0.5 10*3/uL (ref 0.1–1.0)
Monocytes Relative: 7.1 % (ref 3.0–12.0)
Neutro Abs: 3.9 10*3/uL (ref 1.4–7.7)
Neutrophils Relative %: 56.1 % (ref 43.0–77.0)
Platelets: 314 10*3/uL (ref 150.0–400.0)
RBC: 4.53 Mil/uL (ref 3.87–5.11)
RDW: 13.5 % (ref 11.5–15.5)
WBC: 6.9 10*3/uL (ref 4.0–10.5)

## 2023-05-08 LAB — COMPREHENSIVE METABOLIC PANEL WITH GFR
ALT: 16 U/L (ref 0–35)
AST: 15 U/L (ref 0–37)
Albumin: 4.2 g/dL (ref 3.5–5.2)
Alkaline Phosphatase: 37 U/L — ABNORMAL LOW (ref 39–117)
BUN: 20 mg/dL (ref 6–23)
CO2: 31 meq/L (ref 19–32)
Calcium: 10.5 mg/dL (ref 8.4–10.5)
Chloride: 101 meq/L (ref 96–112)
Creatinine, Ser: 0.78 mg/dL (ref 0.40–1.20)
GFR: 74.85 mL/min (ref >60.00)
Glucose, Bld: 126 mg/dL — ABNORMAL HIGH (ref 70–99)
Potassium: 5.3 meq/L — ABNORMAL HIGH (ref 3.5–5.1)
Sodium: 141 meq/L (ref 135–145)
Total Bilirubin: 0.6 mg/dL (ref 0.2–1.2)
Total Protein: 6.7 g/dL (ref 6.0–8.3)

## 2023-05-08 LAB — MICROALBUMIN / CREATININE URINE RATIO
Creatinine,U: 147.9 mg/dL
Microalb Creat Ratio: 21.4 mg/g (ref 0.0–30.0)
Microalb, Ur: 3.2 mg/dL — ABNORMAL HIGH (ref 0.0–1.9)

## 2023-05-08 LAB — LIPID PANEL
Cholesterol: 178 mg/dL (ref 0–200)
HDL: 61.3 mg/dL (ref >39.00)
LDL Cholesterol: 98 mg/dL (ref 0–99)
NonHDL: 116.23
Total CHOL/HDL Ratio: 3
Triglycerides: 90 mg/dL (ref 0.0–149.0)
VLDL: 18 mg/dL (ref 0.0–40.0)

## 2023-05-08 LAB — TSH: TSH: 0.53 u[IU]/mL (ref 0.35–5.50)

## 2023-05-08 LAB — VITAMIN B12: Vitamin B-12: >1537 pg/mL — ABNORMAL HIGH (ref 211–911)

## 2023-05-08 NOTE — Telephone Encounter (Signed)
-----   Message from Gerry Krone sent at 05/08/2023  8:40 AM EDT ----- Regarding: released mammo in error I released her mammo in error, can you please re order? Thanks

## 2023-05-15 ENCOUNTER — Encounter: Payer: Self-pay | Admitting: Family Medicine

## 2023-05-15 ENCOUNTER — Ambulatory Visit (INDEPENDENT_AMBULATORY_CARE_PROVIDER_SITE_OTHER): Admitting: Family Medicine

## 2023-05-15 VITALS — BP 128/78 | HR 74 | Temp 97.8°F | Ht 60.75 in | Wt 157.2 lb

## 2023-05-15 DIAGNOSIS — Z79899 Other long term (current) drug therapy: Secondary | ICD-10-CM | POA: Diagnosis not present

## 2023-05-15 DIAGNOSIS — R0789 Other chest pain: Secondary | ICD-10-CM | POA: Diagnosis not present

## 2023-05-15 DIAGNOSIS — E1169 Type 2 diabetes mellitus with other specified complication: Secondary | ICD-10-CM | POA: Diagnosis not present

## 2023-05-15 DIAGNOSIS — E876 Hypokalemia: Secondary | ICD-10-CM | POA: Diagnosis not present

## 2023-05-15 DIAGNOSIS — I10 Essential (primary) hypertension: Secondary | ICD-10-CM | POA: Diagnosis not present

## 2023-05-15 DIAGNOSIS — K219 Gastro-esophageal reflux disease without esophagitis: Secondary | ICD-10-CM | POA: Diagnosis not present

## 2023-05-15 DIAGNOSIS — E039 Hypothyroidism, unspecified: Secondary | ICD-10-CM | POA: Diagnosis not present

## 2023-05-15 DIAGNOSIS — R2242 Localized swelling, mass and lump, left lower limb: Secondary | ICD-10-CM

## 2023-05-15 DIAGNOSIS — E119 Type 2 diabetes mellitus without complications: Secondary | ICD-10-CM

## 2023-05-15 DIAGNOSIS — Z1231 Encounter for screening mammogram for malignant neoplasm of breast: Secondary | ICD-10-CM

## 2023-05-15 DIAGNOSIS — Z7984 Long term (current) use of oral hypoglycemic drugs: Secondary | ICD-10-CM | POA: Diagnosis not present

## 2023-05-15 DIAGNOSIS — R2240 Localized swelling, mass and lump, unspecified lower limb: Secondary | ICD-10-CM | POA: Insufficient documentation

## 2023-05-15 DIAGNOSIS — F418 Other specified anxiety disorders: Secondary | ICD-10-CM | POA: Diagnosis not present

## 2023-05-15 DIAGNOSIS — E785 Hyperlipidemia, unspecified: Secondary | ICD-10-CM

## 2023-05-15 MED ORDER — ROSUVASTATIN CALCIUM 10 MG PO TABS: 10.0000 mg | ORAL_TABLET | Freq: Every day | ORAL | 1 refills | Status: DC

## 2023-05-15 NOTE — Assessment & Plan Note (Signed)
 Disc goals for lipids and reasons to control them Rev last labs with pt Rev low sat fat diet in detail  LDL of 98 Plan to change her pravastatin  20 mg to crestor 10  Re check 4-6 wk

## 2023-05-15 NOTE — Patient Instructions (Addendum)
 Get set up with your counselor   Your potassium is slightly high  Go ahead and stop your potassium   Cut B12 to every other day   Cholesterol is up  I would like to change pravastatin  20 to crestor (rosuvastatin 10 mg) daily  Take it in the evening with a low fat snack  If you have any side effects like muscle pain -call and stop it and let us  know  Schedule fasting labs in 4-6 weeks   Avoid red meat/ fried foods/ egg yolks/ fatty breakfast meats/ butter, cheese and high fat dairy/ and shellfish     I will give some thought to the chest soreness and to the lump on right leg      You have an order for:  []   2D Mammogram  [x]   3D Mammogram  []   Bone Density     Please call for appointment:   [x]   [facility]  [address] [place] [address]  [phone]  []   [facility])   [address]  [place], [place] [address]  [phone]  []   [facility]      [address] [place], [place]        [phone]         []   [facility]  [address] [place], [place]  [phone]  []  [facility]   [address]   [place], [place] [address]  [phone]  []  [facility]  [address] [place], [place] [address] [phone]    Make sure to wear two piece clothing  No lotions powders or deodorants the day of the appointment Make sure to bring picture ID and insurance card.  Bring list of medications you are currently taking including any supplements.   Schedule your screening mammogram through MyChart!   Select [place] imaging sites can now be scheduled through MyChart.  Log into your MyChart account.  Go to 'Visit' (or 'Appointments' if  on mobile App) --> Schedule an  Appointment  Under 'Select a  Reason for Visit' choose the Mammogram  Screening option.  Complete the pre-visit questions  and select the time and place that  best fits your schedule

## 2023-05-15 NOTE — Progress Notes (Signed)
 Subjective:    Patient ID: Andrea Cooper, female    DOB: 1950-05-21, 73 y.o.   MRN: [id number]  HPI Here for annual follow up of chronic medical problems   Wt Readings from Last 3 Encounters:  05/15/23 157 lb 4 oz (71.3 kg)  03/29/23 156 lb (70.8 kg)  03/27/23 156 lb 6 oz (70.9 kg)   29.96 kg/m  Vitals:   05/15/23 1023  BP: 128/78  Pulse: 74  Temp: 97.8 F (36.6 C)  SpO2: 97%    Immunization History  Administered Date(s) Administered   Fluad Quad(high Dose 65+) 09/28/2018, 11/09/2020   Fluad Trivalent(High Dose 65+) 09/27/2022   Influenza Whole 01/13/2009   Influenza, High Dose Seasonal PF 11/06/2016, 10/24/2017, 09/09/2019   Influenza,inj,Quad PF,6+ Mos 10/07/2013, 12/21/2015   Moderna Covid-19 Vaccine  Bivalent Booster 38yrs & up 12/21/2020   PFIZER(Purple Top)SARS-COV-2 Vaccination 03/04/2019, 03/25/2019, 10/30/2019   Pneumococcal Conjugate-13 07/14/2014, 06/20/2017   Pneumococcal Polysaccharide-23 10/01/2008, 02/14/2016, 09/09/2019   Td 01/08/1997, 04/16/2005   Zoster Recombinant(Shingrix) 10/06/2018, 12/06/2018   Zoster, Live 12/15/2013    Health Maintenance Due  Topic Date Due   FOOT EXAM  05/10/2023     Mammogram 05/2022 Self breast exam-no lumps    Colon cancer screening  colonoscopy 01/2018  Bone health  Dexa 07/2021 -normal [place]-had a fall , sat in a chair on plastic mat/chair went out under the chair / she fell on chest (2 weeks)  Starting to improve but still sore  Fractures-none  Supplements ca and D    Exercise  8 lb weights and some band   Mood    03/29/2023    9:41 AM 03/27/2022    8:31 AM 03/27/2022    8:30 AM 06/21/2021    2:09 PM 03/24/2021    8:24 AM  Depression screen PHQ 2/9  Decreased Interest 0 0 0 0 0  Down, Depressed, Hopeless 0 0 0 0 0  PHQ - 2 Score 0 0 0 0 0   Fluoxetine  80 mg daily  Wellbutrin  xl 300 mg daily   We referred for counseling but she has not gone     HTN bp is stable today  No cp or palpitations or  headaches or edema  No side effects to medicines  BP Readings from Last 3 Encounters:  05/15/23 128/78  03/27/23 118/70  05/10/22 122/70    Losartan  100 mg daily  Amlodipine  10 mg daily  Hctz 25 mg daily  Propranolol  ER 120 mg daily   Pulse Readings from Last 3 Encounters:  05/15/23 74  03/27/23 81  05/10/22 73     Lab Results  Component Value Date   NA 141 05/08/2023   K 5.3 No hemolysis seen (H) 05/08/2023   CO2 31 05/08/2023   GLUCOSE 126 (H) 05/08/2023   BUN 20 05/08/2023   CREATININE 0.78 05/08/2023   CALCIUM  10.5 05/08/2023   GFR 74.85 05/08/2023   GFRNONAA >60 01/30/2019  Klor con 10 meq daily  GERD Omeprazole  20 mg daily  Lab Results  Component Value Date   VITAMINB12 >1537 (H) 05/08/2023  Forgot to reduce the B12 to every other day   Hypothyroidism  Pt has no clinical changes No change in energy level/ hair or skin/ edema and no tremor Lab Results  Component Value Date   TSH 0.53 05/08/2023     Levothyroxine  137 mcg daily   Is taking nutrafol for hair Has biotin     DM2 Lab Results  Component Value Date  HGBA1C 5.3 03/27/2023   HGBA1C 5.1 05/03/2022   HGBA1C 5.2 10/25/2021   Metformin  500 mg bid  Semaglutide  1 mg weekly  Tolerating well   Lab Results  Component Value Date   MICROALBUR 3.2 (H) 05/08/2023   MICROALBUR 3.3 (H) 05/10/2022     On arb and statin   Lab Results  Component Value Date   ALT 16 05/08/2023   AST 15 05/08/2023   ALKPHOS 37 (L) 05/08/2023   BILITOT 0.6 05/08/2023   Lab Results  Component Value Date   WBC 6.9 05/08/2023   HGB 14.1 05/08/2023   HCT 42.2 05/08/2023   MCV 93.0 05/08/2023   PLT 314.0 05/08/2023    Cholesterol Lab Results  Component Value Date   CHOL 178 05/08/2023   CHOL 165 05/03/2022   CHOL 152 10/25/2021   Lab Results  Component Value Date   HDL 61.30 05/08/2023   HDL 67.20 05/03/2022   HDL 54.10 10/25/2021   Lab Results  Component Value Date   LDLCALC 98 05/08/2023    LDLCALC 81 05/03/2022   LDLCALC 77 10/25/2021   Lab Results  Component Value Date   TRIG 90.0 05/08/2023   TRIG 81.0 05/03/2022   TRIG 103.0 10/25/2021   Lab Results  Component Value Date   CHOLHDL 3 05/08/2023   CHOLHDL 2 05/03/2022   CHOLHDL 3 10/25/2021   Lab Results  Component Value Date   LDLDIRECT 160.7 01/14/2007   LDLDIRECT 202.7 08/01/2006   Pravastatin  20   Chest/ back still sore  Quit smoking 2008 Smoked for 40 years   Lump bothers her in left lower leg  Had us  2022 and no clots or other findings Thinks it occational makes foot swell    Patient Active Problem List   Diagnosis Date Noted   Chest wall pain 05/15/2023   Current use of proton pump inhibitor 03/23/2020   Allergic dermatitis 01/07/2018   Hearing loss 02/14/2016   Estrogen deficiency 02/14/2016   GERD (gastroesophageal reflux disease) 08/13/2015   Hypokalemia 12/15/2013   Encounter for screening mammogram for breast cancer 06/07/2011   Sleep apnea 09/13/2009   Overweight 01/13/2009   SWEATING 07/15/2008   Diabetes type 2, controlled (HCC) 05/22/2007   TOBACCO USE, QUIT 05/22/2007   CEREBRAL ANEURYSM 08/09/2006   Hypothyroidism 07/25/2006   Hyperlipidemia associated with type 2 diabetes mellitus (HCC) 07/25/2006   Anxiety state 07/25/2006   Depression with anxiety 07/25/2006   Essential hypertension 07/25/2006   IBS 07/25/2006   Polyphagia 07/25/2006   SKIN CANCER, HX OF 07/25/2006   Past Medical History:  Diagnosis Date   Anxiety    Cataract 2019   corrected with surgery   Depression    Diabetes mellitus without complication (HCC)    GERD (gastroesophageal reflux disease)    History of anxiety    History of cerebral aneurysm repair    history of cerebral aneurysm    History of depression    History of hyperglycemia    History of hyperlipidemia    History of hypothyroidism    History of melanoma    History of motion sickness    History of skin cancer    hx of melanoma left  lower arm, recurrance   Hypercholesterolemia    Hypertension    Hypothyroidism    IBS (irritable bowel syndrome)    Nocturnal oxygen desaturation    PONV (postoperative nausea and vomiting)    Thyroid  disease    Past Surgical History:  Procedure Laterality Date  ABDOMINAL HYSTERECTOMY     APPENDECTOMY  1966   BREAST BIOPSY Left 07-29-14    core bx FIBROCYSTIC CHANGES WITH FEW MICROCALCIFICATIONS.   cardiolite  12/1997   normal EF 68%   CATARACT EXTRACTION W/ INTRAOCULAR LENS  IMPLANT, BILATERAL Bilateral 04/2017   [facility]   CEREBRAL ANEURYSM REPAIR  2008, 2009   coiling at [facility]   COLONOSCOPY WITH PROPOFOL  N/A 01/22/2018   Procedure: COLONOSCOPY WITH PROPOFOL ;  Surgeon: Toledo, Alphonsus Jeans, MD;  Location: [facility] ENDOSCOPY;  Service: Gastroenterology;  Laterality: N/A;   EYE SURGERY     RR   FOOT SURGERY  02/1998   bilateral   INTRAOPERATIVE ARTERIOGRAM  10/17/2015   MOHS SURGERY  1992   TOE SURGERY Left 2015   TONSILLECTOMY  1964   TOTAL ABDOMINAL HYSTERECTOMY W/ BILATERAL SALPINGOOPHORECTOMY     Social History   Tobacco Use   Smoking status: Former   Smokeless tobacco: Never  Vaping Use   Vaping status: Never Used  Substance Use Topics   Alcohol use: Yes    Alcohol/week: 1.0 standard drink of alcohol    Types: 1 Standard drinks or equivalent per week    Comment: occasional wine   Drug use: No   Family History  Problem Relation Age of Onset   Diabetes Father    Depression Father    CAD Father    Diabetes Mother    Diverticulosis Mother    Adrenal disorder Mother    Breast cancer Other        distant relatives (all on mother's side)   Cerebral aneurysm Sister    Allergies  Allergen Reactions   Codeine Nausea And Vomiting    REACTION: nausea and vomiting   Lisinopril      Cough/throat symptoms    Latex Rash   Current Outpatient Medications on File Prior to Visit  Medication Sig Dispense Refill   amLODipine  (NORVASC ) 10 MG tablet Take 1 tablet (10 mg  total) by mouth daily. 90 tablet 3   aspirin EC 81 MG tablet Take 81 mg by mouth daily.     buPROPion  (WELLBUTRIN  XL) 300 MG 24 hr tablet Take 1 tablet (300 mg total) by mouth daily. 90 tablet 3   calcium  carbonate (CALCIUM  600) 600 MG TABS tablet Take 1 tablet (600 mg total) by mouth 2 (two) times daily. 180 tablet 3   Cholecalciferol (VITAMIN D3) 50 MCG (2000 UT) capsule Take 1 capsule (2,000 Units total) by mouth daily. 90 capsule 3   FLUoxetine  (PROZAC ) 40 MG capsule Take 2 capsules (80 mg total) by mouth daily. 180 capsule 3   glucose blood (ONETOUCH ULTRA) test strip USE 1 STRIP TO CHECK GLUCOSE ONCE DAILYH AS DIRECTED (daignosis code: E11.9) 100 each 1   Homeopathic Products (ZICAM COLD REMEDY PO) Take by mouth.     hydrochlorothiazide  (HYDRODIURIL ) 25 MG tablet Take 1 tablet (25 mg total) by mouth daily. 90 tablet 3   levothyroxine  (SYNTHROID ) 137 MCG tablet TAKE 1 TABLET DAILY BEFORE BREAKFAST 90 tablet 3   losartan  (COZAAR ) 100 MG tablet Take 1 tablet (100 mg total) by mouth daily. 90 tablet 3   metFORMIN  (GLUCOPHAGE ) 500 MG tablet Take 1 tablet (500 mg total) by mouth 2 (two) times daily with a meal. 180 tablet 3   omeprazole  (PRILOSEC) 20 MG capsule Take 1 capsule (20 mg total) by mouth daily. 90 capsule 3   OneTouch Delica Lancets 33G MISC Check blood sugar twice a day and as directed. Dx  E11.9 200 each 1   OZEMPIC , 1 MG/DOSE, 4 MG/3ML SOPN INJECT 1 MG UNDER THE SKIN ONE DAY A WEEK AS DIRECTED 3 mL 3   propranolol  ER (INDERAL  LA) 120 MG 24 hr capsule Take 1 capsule (120 mg total) by mouth daily. 90 capsule 3   No current facility-administered medications on file prior to visit.    Review of Systems  Constitutional:  Negative for activity change, appetite change, fatigue, fever and unexpected weight change.  HENT:  Negative for congestion, ear pain, rhinorrhea, sinus pressure and sore throat.   Eyes:  Negative for pain, redness and visual disturbance.  Respiratory:  Negative for  cough, shortness of breath and wheezing.        Chest wall pain   Cardiovascular:  Negative for chest pain and palpitations.  Gastrointestinal:  Negative for abdominal pain, blood in stool, constipation and diarrhea.  Endocrine: Negative for polydipsia and polyuria.  Genitourinary:  Negative for dysuria, frequency and urgency.  Musculoskeletal:  Negative for arthralgias, back pain and myalgias.       Lump in left leg   Skin:  Negative for pallor and rash.  Allergic/Immunologic: Negative for environmental allergies.  Neurological:  Negative for dizziness, syncope and headaches.  Hematological:  Negative for adenopathy. Does not bruise/bleed easily.  Psychiatric/Behavioral:  Negative for decreased concentration and dysphoric mood. The patient is not nervous/anxious.        Objective:   Physical Exam Constitutional:      General: She is not in acute distress.    Appearance: Normal appearance. She is well-developed. She is obese. She is not ill-appearing or diaphoretic.  HENT:     Head: Normocephalic and atraumatic.     Right Ear: Tympanic membrane, ear canal and external ear normal.     Left Ear: Tympanic membrane, ear canal and external ear normal.     Nose: Nose normal. No congestion.     Mouth/Throat:     Mouth: Mucous membranes are moist.     Pharynx: Oropharynx is clear. No posterior oropharyngeal erythema.  Eyes:     General: No scleral icterus.    Extraocular Movements: Extraocular movements intact.     Conjunctiva/sclera: Conjunctivae normal.     Pupils: Pupils are equal, round, and reactive to light.  Neck:     Thyroid : No thyromegaly.     Vascular: No carotid bruit or JVD.  Cardiovascular:     Rate and Rhythm: Normal rate and regular rhythm.     Pulses: Normal pulses.     Heart sounds: Normal heart sounds.     No gallop.  Pulmonary:     Effort: Pulmonary effort is normal. No respiratory distress.     Breath sounds: Normal breath sounds. No wheezing.     Comments:  Good air exch Chest:     Chest wall: No tenderness.  Abdominal:     General: Bowel sounds are normal. There is no distension or abdominal bruit.     Palpations: Abdomen is soft. There is no mass.     Tenderness: There is no abdominal tenderness.     Hernia: No hernia is present.  Genitourinary:    Comments: Breast exam: No mass, nodules, thickening, tenderness, bulging, retraction, inflamation, nipple discharge or skin changes noted.  No axillary or clavicular LA.     Musculoskeletal:        General: No tenderness. Normal range of motion.     Cervical back: Normal range of motion and neck supple. No rigidity.  No muscular tenderness.     Right lower leg: No edema.     Left lower leg: No edema.     Comments: No kyphosis   Soft lump in left medial LE /near calf  Not very mobile  No LE edema    Lymphadenopathy:     Cervical: No cervical adenopathy.  Skin:    General: Skin is warm and dry.     Coloration: Skin is not pale.     Findings: No erythema or rash.     Comments: Solar lentigines diffusely  Some sks  Neurological:     Mental Status: She is alert. Mental status is at baseline.     Cranial Nerves: No cranial nerve deficit.     Motor: No abnormal muscle tone.     Coordination: Coordination normal.     Gait: Gait normal.     Deep Tendon Reflexes: Reflexes are normal and symmetric. Reflexes normal.  Psychiatric:        Mood and Affect: Mood normal.        Cognition and Memory: Cognition and memory normal.           Assessment & Plan:   Problem List Items Addressed This Visit       Cardiovascular and Mediastinum   Essential hypertension - Primary   bp in fair control at this time  BP Readings from Last 1 Encounters:  05/15/23 128/78   No changes needed Most recent labs reviewed  Disc lifstyle change with low sodium diet and exercise  Losartan  100 mg daily  Amlodipine  10 mg daily  Hctz 25 mg daily  Propranolol  ER 120 mg daily       Relevant Medications    rosuvastatin (CRESTOR) 10 MG tablet     Digestive   GERD (gastroesophageal reflux disease)   Continues omeprazole  Watching B12 level        Endocrine   Hypothyroidism   Hypothyroidism  Pt has no clinical changes No change in energy level/ hair or skin/ edema and no tremor Lab Results  Component Value Date   TSH 0.53 05/08/2023    Plan to continue levothyroxine  137 mcg daily       Hyperlipidemia associated with type 2 diabetes mellitus (HCC)   Disc goals for lipids and reasons to control them Rev last labs with pt Rev low sat fat diet in detail  LDL of 98 Plan to change her pravastatin  20 mg to crestor 10  Re check 4-6 wk      Relevant Medications   rosuvastatin (CRESTOR) 10 MG tablet   Other Relevant Orders   ALT   AST   Lipid panel   Diabetes type 2, controlled (HCC)   Lab Results  Component Value Date   HGBA1C 5.3 03/27/2023   HGBA1C 5.1 05/03/2022   HGBA1C 5.2 10/25/2021   Doing well with  Metformin  500 mg bid Semaglutide  1 mg weekly  Good diet  Microalb utd On arb and statin       Relevant Medications   rosuvastatin (CRESTOR) 10 MG tablet     Other   Hypokalemia   K is actually high  Lab Results  Component Value Date   K 5.3 No hemolysis seen (H) 05/08/2023   Will stop klor con      Encounter for screening mammogram for breast cancer   Mammogram ordered Pt will call to schedule       Relevant Orders   MM 3D SCREENING MAMMOGRAM BILATERAL BREAST  Depression with anxiety   Strongly encouraged to get back to counseling  Continue   Fluoxetine  80 mg daily  Wellbutrin  xl 300 mg daily       Current use of proton pump inhibitor   Lab Results  Component Value Date   VITAMINB12 >1537 (H) 05/08/2023    Will cut B12 to every other day      Chest wall pain   After fall several weeks ago from chair  Recommend warm compress if needed  Update if no further improvement-would consider cxr

## 2023-05-15 NOTE — Assessment & Plan Note (Signed)
 Left  Medial   ? Per pt bigger  US  2022 normal   ? Consider further imaging or surg ref  Lipoma may be in differential  Not tender

## 2023-05-15 NOTE — Assessment & Plan Note (Signed)
 After fall several weeks ago from chair  Recommend warm compress if needed  Update if no further improvement-would consider cxr

## 2023-05-15 NOTE — Assessment & Plan Note (Signed)
Mammogram ordered °Pt will call to schedule  °

## 2023-05-15 NOTE — Assessment & Plan Note (Signed)
 Lab Results  Component Value Date   VITAMINB12 >1537 (H) 05/08/2023    Will cut B12 to every other day

## 2023-05-15 NOTE — Assessment & Plan Note (Signed)
 Continues omeprazole  Watching B12 level

## 2023-05-15 NOTE — Assessment & Plan Note (Signed)
 K is actually high  Lab Results  Component Value Date   K 5.3 No hemolysis seen (H) 05/08/2023   Will stop klor con

## 2023-05-15 NOTE — Assessment & Plan Note (Signed)
 bp in fair control at this time  BP Readings from Last 1 Encounters:  05/15/23 128/78   No changes needed Most recent labs reviewed  Disc lifstyle change with low sodium diet and exercise  Losartan  100 mg daily  Amlodipine  10 mg daily  Hctz 25 mg daily  Propranolol  ER 120 mg daily

## 2023-05-15 NOTE — Assessment & Plan Note (Signed)
 Strongly encouraged to get back to counseling  Continue   Fluoxetine  80 mg daily  Wellbutrin  xl 300 mg daily

## 2023-05-15 NOTE — Assessment & Plan Note (Signed)
 Lab Results  Component Value Date   HGBA1C 5.3 03/27/2023   HGBA1C 5.1 05/03/2022   HGBA1C 5.2 10/25/2021   Doing well with  Metformin  500 mg bid Semaglutide  1 mg weekly  Good diet  Microalb utd On arb and statin

## 2023-05-15 NOTE — Assessment & Plan Note (Signed)
 Hypothyroidism  Pt has no clinical changes No change in energy level/ hair or skin/ edema and no tremor Lab Results  Component Value Date   TSH 0.53 05/08/2023    Plan to continue levothyroxine  137 mcg daily

## 2023-05-17 ENCOUNTER — Ambulatory Visit: Admitting: Audiologist

## 2023-05-18 ENCOUNTER — Telehealth: Payer: Self-pay | Admitting: *Deleted

## 2023-05-18 DIAGNOSIS — R2242 Localized swelling, mass and lump, left lower limb: Secondary | ICD-10-CM

## 2023-05-18 DIAGNOSIS — H9193 Unspecified hearing loss, bilateral: Secondary | ICD-10-CM

## 2023-05-18 DIAGNOSIS — R0789 Other chest pain: Secondary | ICD-10-CM

## 2023-05-18 NOTE — Telephone Encounter (Signed)
-----   Message from [place] sent at 05/15/2023  1:45 PM EDT ----- Please ask pt if she wants to come in for cxr and xr of left lower leg for the complaints we discussed today but ran out of time for

## 2023-05-18 NOTE — Telephone Encounter (Signed)
 Orders and referral done

## 2023-05-18 NOTE — Telephone Encounter (Signed)
 Pt said she would like to come in on Monday for the xrays, I gave her the xray info on when to come.  ** pt did have another issue for PCP**  At her appt with the [facility] nurse, Cornelius Dill placed a referral for hearing testing. Pt had to cancel her appt because her daughter was sick recently but realized that [facility] ordered referral to be done in [place]. Pt is requesting if PCP can place a new referral for hearing eval in [place].

## 2023-05-21 ENCOUNTER — Ambulatory Visit
Admission: RE | Admit: 2023-05-21 | Discharge: 2023-05-21 | Disposition: A | Discharge status: Home / Self Care | Source: Ambulatory Visit | Attending: Family Medicine | Admitting: Family Medicine

## 2023-05-21 ENCOUNTER — Encounter: Payer: Self-pay | Admitting: Family Medicine

## 2023-05-21 DIAGNOSIS — R2242 Localized swelling, mass and lump, left lower limb: Secondary | ICD-10-CM | POA: Diagnosis not present

## 2023-05-21 DIAGNOSIS — R0789 Other chest pain: Secondary | ICD-10-CM | POA: Diagnosis not present

## 2023-05-23 NOTE — Addendum Note (Signed)
 Addended by: Deri Fleet A on: 05/23/2023 08:02 PM   Modules accepted: Orders

## 2023-06-07 ENCOUNTER — Ambulatory Visit (INDEPENDENT_AMBULATORY_CARE_PROVIDER_SITE_OTHER): Admitting: General Surgery

## 2023-06-07 ENCOUNTER — Encounter: Payer: Self-pay | Admitting: General Surgery

## 2023-06-07 VITALS — BP 98/66 | HR 78 | Ht 61.0 in | Wt 153.0 lb

## 2023-06-07 DIAGNOSIS — L905 Scar conditions and fibrosis of skin: Secondary | ICD-10-CM | POA: Diagnosis not present

## 2023-06-07 NOTE — Patient Instructions (Signed)
   Follow-up with our office as needed.  Please call and ask to speak with a nurse if you develop questions or concerns.

## 2023-06-07 NOTE — Progress Notes (Unsigned)
 Patient ID: Andrea Cooper, female   DOB: January 03, 1951, 73 y.o.   MRN: [id number] CC: Left Leg Lesion History of Present Illness Andrea Cooper is a 73 y.o. female with .PMH as below who presenst in consultation for left leg lesion.  The patient reports that she has noticed a small area of swelling in her left leg.  She says intermittently it will cause her pain but mostly does not bother her.  She has noticed a little bit of overlying skin changes at the area.  She also reports that she has had a number of dermatologic procedures on her skin and this is the site of a resection of a skin lesion.  She denies any drainage from the wound.  She denies any radiating pain.  She denies any fevers or chills or any redness to the area.  Past Medical History Past Medical History:  Diagnosis Date   Anxiety    Cataract 2019   corrected with surgery   Depression    Diabetes mellitus without complication (HCC)    GERD (gastroesophageal reflux disease)    History of anxiety    History of cerebral aneurysm repair    history of cerebral aneurysm    History of depression    History of hyperglycemia    History of hyperlipidemia    History of hypothyroidism    History of melanoma    History of motion sickness    History of skin cancer    hx of melanoma left lower arm, recurrance   Hypercholesterolemia    Hypertension    Hypothyroidism    IBS (irritable bowel syndrome)    Nocturnal oxygen desaturation    PONV (postoperative nausea and vomiting)    Thyroid  disease        Past Surgical History:  Procedure Laterality Date   ABDOMINAL HYSTERECTOMY     APPENDECTOMY  1966   BREAST BIOPSY Left 07-29-14    core bx FIBROCYSTIC CHANGES WITH FEW MICROCALCIFICATIONS.   cardiolite  12/1997   normal EF 68%   CATARACT EXTRACTION W/ INTRAOCULAR LENS  IMPLANT, BILATERAL Bilateral 04/2017   [facility]   CEREBRAL ANEURYSM REPAIR  2008, 2009   coiling at [facility]   COLONOSCOPY WITH PROPOFOL  N/A 01/22/2018    Procedure: COLONOSCOPY WITH PROPOFOL ;  Surgeon: Toledo, Alphonsus Jeans, MD;  Location: [facility] ENDOSCOPY;  Service: Gastroenterology;  Laterality: N/A;   EYE SURGERY     RR   FOOT SURGERY  02/1998   bilateral   INTRAOPERATIVE ARTERIOGRAM  10/17/2015   MOHS SURGERY  1992   TOE SURGERY Left 2015   TONSILLECTOMY  1964   TOTAL ABDOMINAL HYSTERECTOMY W/ BILATERAL SALPINGOOPHORECTOMY      Allergies  Allergen Reactions   Codeine Nausea And Vomiting    REACTION: nausea and vomiting   Lisinopril      Cough/throat symptoms    Latex Rash    Current Outpatient Medications  Medication Sig Dispense Refill   amLODipine  (NORVASC ) 10 MG tablet Take 1 tablet (10 mg total) by mouth daily. 90 tablet 3   aspirin EC 81 MG tablet Take 81 mg by mouth daily.     buPROPion  (WELLBUTRIN  XL) 300 MG 24 hr tablet Take 1 tablet (300 mg total) by mouth daily. 90 tablet 3   calcium  carbonate (CALCIUM  600) 600 MG TABS tablet Take 1 tablet (600 mg total) by mouth 2 (two) times daily. 180 tablet 3   Cholecalciferol (VITAMIN D3) 50 MCG (2000 UT) capsule Take 1 capsule (2,000  Units total) by mouth daily. 90 capsule 3   FLUoxetine  (PROZAC ) 40 MG capsule Take 2 capsules (80 mg total) by mouth daily. 180 capsule 3   glucose blood (ONETOUCH ULTRA) test strip USE 1 STRIP TO CHECK GLUCOSE ONCE DAILYH AS DIRECTED (daignosis code: E11.9) 100 each 1   Homeopathic Products (ZICAM COLD REMEDY PO) Take by mouth.     hydrochlorothiazide  (HYDRODIURIL ) 25 MG tablet Take 1 tablet (25 mg total) by mouth daily. 90 tablet 3   levothyroxine  (SYNTHROID ) 137 MCG tablet TAKE 1 TABLET DAILY BEFORE BREAKFAST 90 tablet 3   losartan  (COZAAR ) 100 MG tablet Take 1 tablet (100 mg total) by mouth daily. 90 tablet 3   metFORMIN  (GLUCOPHAGE ) 500 MG tablet Take 1 tablet (500 mg total) by mouth 2 (two) times daily with a meal. 180 tablet 3   omeprazole  (PRILOSEC) 20 MG capsule Take 1 capsule (20 mg total) by mouth daily. 90 capsule 3   OneTouch Delica Lancets  33G MISC Check blood sugar twice a day and as directed. Dx E11.9 200 each 1   OZEMPIC , 1 MG/DOSE, 4 MG/3ML SOPN INJECT 1 MG UNDER THE SKIN ONE DAY A WEEK AS DIRECTED 3 mL 3   propranolol  ER (INDERAL  LA) 120 MG 24 hr capsule Take 1 capsule (120 mg total) by mouth daily. 90 capsule 3   rosuvastatin  (CRESTOR ) 10 MG tablet Take 1 tablet (10 mg total) by mouth daily. 30 tablet 1   No current facility-administered medications for this visit.    Family History Family History  Problem Relation Age of Onset   Diabetes Father    Depression Father    CAD Father    Diabetes Mother    Diverticulosis Mother    Adrenal disorder Mother    Breast cancer Other        distant relatives (all on mother's side)   Cerebral aneurysm Sister        Social History Social History   Tobacco Use   Smoking status: Former    Passive exposure: Past   Smokeless tobacco: Never  Vaping Use   Vaping status: Never Used  Substance Use Topics   Alcohol use: Yes    Alcohol/week: 1.0 standard drink of alcohol    Types: 1 Standard drinks or equivalent per week    Comment: occasional wine   Drug use: No        ROS Full ROS of systems performed and is otherwise negative there than what is stated in the HPI  Physical Exam Blood pressure 98/66, pulse 78, height 5\' 1"  (1.549 m), weight 153 lb (69.4 kg), SpO2 97%.  Patient is alert and oriented x 3, normal work of breathing room air, regular rate and rhythm, abdomen is soft, nontender nondistended.  On her left leg there is an area on the medial side of her shin that has an old resection scar on it.  This is nontender to touch and there is a small amount of flat, mobile tenderness under this  Data Reviewed I have independently reviewed her labs.  Her last A1c is from March of this year and is 5.3.  I have personally reviewed the patient's imaging and medical records.    Assessment    Patient is a 73 year old with concern for a left lower extremity mass.   This does not feel rounded mobile like a lipoma.  I discussed with the patient that given that this is the site of a previous lesion it could be some scar  tissue.  I also discussed with her that there are couple of options that we could do with this.  I recommended that we do not do anything about it as it is likely just normal tissue and benign.  I discussed with her that if it really bothers her I would be happy to get an ultrasound to see if there is any lesion that may be amenable to resection.  She says that it does not bother her and that she is okay with doing nothing about it.  She will follow-up with our office.    Barrett Lick 06/07/2023, 1:17 PM

## 2023-06-12 ENCOUNTER — Other Ambulatory Visit: Payer: Self-pay | Admitting: *Deleted

## 2023-06-12 MED ORDER — ROSUVASTATIN CALCIUM 10 MG PO TABS: 10.0000 mg | ORAL_TABLET | Freq: Every day | ORAL | 1 refills | Status: DC

## 2023-06-14 ENCOUNTER — Ambulatory Visit
Admission: RE | Admit: 2023-06-14 | Discharge: 2023-06-14 | Disposition: A | Discharge status: Home / Self Care | Source: Ambulatory Visit | Attending: Family Medicine | Admitting: Family Medicine

## 2023-06-14 DIAGNOSIS — Z1231 Encounter for screening mammogram for malignant neoplasm of breast: Secondary | ICD-10-CM | POA: Diagnosis not present

## 2023-06-18 ENCOUNTER — Ambulatory Visit: Payer: Self-pay | Admitting: Family Medicine

## 2023-06-19 ENCOUNTER — Other Ambulatory Visit (INDEPENDENT_AMBULATORY_CARE_PROVIDER_SITE_OTHER)

## 2023-06-19 DIAGNOSIS — E785 Hyperlipidemia, unspecified: Secondary | ICD-10-CM

## 2023-06-19 DIAGNOSIS — E1169 Type 2 diabetes mellitus with other specified complication: Secondary | ICD-10-CM

## 2023-06-19 LAB — LIPID PANEL
Cholesterol: 162 mg/dL (ref 0–200)
HDL: 62.5 mg/dL (ref >39.00)
LDL Cholesterol: 85 mg/dL (ref 0–99)
NonHDL: 99.01
Total CHOL/HDL Ratio: 3
Triglycerides: 69 mg/dL (ref 0.0–149.0)
VLDL: 13.8 mg/dL (ref 0.0–40.0)

## 2023-06-19 LAB — ALT: ALT: 22 U/L (ref 0–35)

## 2023-06-19 LAB — AST: AST: 18 U/L (ref 0–37)

## 2023-06-25 ENCOUNTER — Other Ambulatory Visit: Payer: Self-pay | Admitting: Family Medicine

## 2023-08-30 ENCOUNTER — Telehealth: Payer: Self-pay | Admitting: Family Medicine

## 2023-08-30 NOTE — Telephone Encounter (Signed)
 Ppwk for joining gym that needs provider ok brought in by patient placed in provider box at front desk.

## 2023-08-30 NOTE — Telephone Encounter (Signed)
 Done and in IN box

## 2023-08-30 NOTE — Telephone Encounter (Signed)
 Form in your inbox

## 2023-08-31 NOTE — Telephone Encounter (Signed)
 Pt came in and picked up form.

## 2023-08-31 NOTE — Telephone Encounter (Signed)
 Pt notified form ready for pick up, copy sent to scanning

## 2023-10-11 ENCOUNTER — Ambulatory Visit: Admitting: General Surgery

## 2023-10-13 ENCOUNTER — Other Ambulatory Visit: Payer: Self-pay | Admitting: Family Medicine

## 2023-10-15 NOTE — Telephone Encounter (Signed)
 Last filled on 03/20/23 # 53mL/ 3 refills   F/u scheduled on 10/22/23

## 2023-10-17 DIAGNOSIS — Z23 Encounter for immunization: Secondary | ICD-10-CM | POA: Diagnosis not present

## 2023-10-22 ENCOUNTER — Ambulatory Visit: Payer: Self-pay | Admitting: Family Medicine

## 2023-10-22 ENCOUNTER — Encounter: Payer: Self-pay | Admitting: Family Medicine

## 2023-10-22 ENCOUNTER — Ambulatory Visit: Admitting: Family Medicine

## 2023-10-22 VITALS — BP 125/79 | HR 74 | Temp 98.2°F | Ht 61.0 in | Wt 148.5 lb

## 2023-10-22 DIAGNOSIS — E119 Type 2 diabetes mellitus without complications: Secondary | ICD-10-CM | POA: Diagnosis not present

## 2023-10-22 DIAGNOSIS — I10 Essential (primary) hypertension: Secondary | ICD-10-CM | POA: Diagnosis not present

## 2023-10-22 DIAGNOSIS — Z7985 Long-term (current) use of injectable non-insulin antidiabetic drugs: Secondary | ICD-10-CM | POA: Insufficient documentation

## 2023-10-22 DIAGNOSIS — E785 Hyperlipidemia, unspecified: Secondary | ICD-10-CM

## 2023-10-22 DIAGNOSIS — E1169 Type 2 diabetes mellitus with other specified complication: Secondary | ICD-10-CM

## 2023-10-22 LAB — LIPID PANEL
Cholesterol: 142 mg/dL (ref 0–200)
HDL: 58.5 mg/dL (ref >39.00)
LDL Cholesterol: 65 mg/dL (ref 0–99)
NonHDL: 83.95
Total CHOL/HDL Ratio: 2
Triglycerides: 93 mg/dL (ref 0.0–149.0)
VLDL: 18.6 mg/dL (ref 0.0–40.0)

## 2023-10-22 LAB — COMPREHENSIVE METABOLIC PANEL WITH GFR
ALT: 37 U/L — ABNORMAL HIGH (ref 0–35)
AST: 24 U/L (ref 0–37)
Albumin: 4.4 g/dL (ref 3.5–5.2)
Alkaline Phosphatase: 36 U/L — ABNORMAL LOW (ref 39–117)
BUN: 17 mg/dL (ref 6–23)
CO2: 30 meq/L (ref 19–32)
Calcium: 10.6 mg/dL — ABNORMAL HIGH (ref 8.4–10.5)
Chloride: 100 meq/L (ref 96–112)
Creatinine, Ser: 0.67 mg/dL (ref 0.40–1.20)
GFR: 85.85 mL/min (ref >60.00)
Glucose, Bld: 97 mg/dL (ref 70–99)
Potassium: 4.4 meq/L (ref 3.5–5.1)
Sodium: 144 meq/L (ref 135–145)
Total Bilirubin: 0.4 mg/dL (ref 0.2–1.2)
Total Protein: 6.9 g/dL (ref 6.0–8.3)

## 2023-10-22 LAB — POCT GLYCOSYLATED HEMOGLOBIN (HGB A1C): Hemoglobin A1C: 5.2 % (ref 4.0–5.6)

## 2023-10-22 NOTE — Assessment & Plan Note (Signed)
 Disc goals for lipids and reasons to control them Rev last labs with pt Rev low sat fat diet in detail  LDL of 85  Crestor  10 mg daily   Re check today

## 2023-10-22 NOTE — Patient Instructions (Addendum)
 Continue the 1 mg ozempic   Continue the strength training and protein intake   Lab today   Avoid added sugars in your diet when you can  Try to get most of your carbohydrates from produce (with the exception of white potatoes) and whole grains Eat less bread/pasta/rice/snack foods/cereals/sweets and other items from the middle of the grocery store (processed carbs)

## 2023-10-22 NOTE — Progress Notes (Signed)
 Subjective:    Patient ID: Andrea Cooper, female    DOB: 09-Feb-1950, 73 y.o.   MRN: [id number]  HPI  Wt Readings from Last 3 Encounters:  10/22/23 148 lb 8 oz (67.4 kg)  06/07/23 153 lb (69.4 kg)  05/15/23 157 lb 4 oz (71.3 kg)   28.06 kg/m  Vitals:   10/22/23 0833 10/22/23 0852  BP: 126/82 125/79  Pulse: 74   Temp: 98.2 F (36.8 C)   SpO2: 96%    Pt presents for follow up of chronic medical problems incl  DM2 HTN Hyperlipidemia  Doing very well  Joined the [facility] gym Working on [employer], [employer]/film/video /some socializing    HTN bp is stable today  No cp or palpitations or headaches or edema  No side effects to medicines  BP Readings from Last 3 Encounters:  10/22/23 125/79  06/07/23 98/66  05/15/23 128/78   Losartan  100 mg daily  Amlodipine  10 mg daily  Hctz 25 mg daily  Propranolol  ER 120 mg daily   Lab Results  Component Value Date   NA 141 05/08/2023   K 5.3 No hemolysis seen (H) 05/08/2023   CO2 31 05/08/2023   GLUCOSE 126 (H) 05/08/2023   BUN 20 05/08/2023   CREATININE 0.78 05/08/2023   CALCIUM  10.5 05/08/2023   GFR 74.85 05/08/2023   GFRNONAA >60 01/30/2019    DM2 Diabetes Home sugar results  No lows   DM diet  Eating less overall  Tries to avoid sweets  Lots of vegetables Also mindful of protein    Exercise - strength training at senior center   Metformin  500 mg bid Semaglutide  1 mg weekly   A1c 5.2 today  Lab Results  Component Value Date   HGBA1C 5.2 10/22/2023   HGBA1C 5.3 03/27/2023   HGBA1C 5.1 05/03/2022   Lab Results  Component Value Date   MICROALBUR 3.2 (H) 05/08/2023   MICROALBUR 1.8 07/09/2008    Renal protection-arb On statin  Last eye exam -utd 11/2022   Hyperlipidemia Lab Results  Component Value Date   CHOL 162 06/19/2023   HDL 62.50 06/19/2023   LDLCALC 85 06/19/2023   LDLDIRECT 160.7 01/14/2007   TRIG 69.0 06/19/2023   CHOLHDL 3 06/19/2023   Crestor  10 mg daily   Patient Active Problem  List   Diagnosis Date Noted   Long-term (current) use of injectable non-insulin antidiabetic drugs 10/22/2023   Chest wall pain 05/15/2023   Localized swelling, mass, or lump of lower extremity 05/15/2023   Current use of proton pump inhibitor 03/23/2020   Allergic dermatitis 01/07/2018   Hearing loss 02/14/2016   Estrogen deficiency 02/14/2016   GERD (gastroesophageal reflux disease) 08/13/2015   Hypokalemia 12/15/2013   Encounter for screening mammogram for breast cancer 06/07/2011   Sleep apnea 09/13/2009   Overweight 01/13/2009   SWEATING 07/15/2008   Diabetes type 2, controlled (HCC) 05/22/2007   TOBACCO USE, QUIT 05/22/2007   CEREBRAL ANEURYSM 08/09/2006   Hypothyroidism 07/25/2006   Hyperlipidemia associated with type 2 diabetes mellitus (HCC) 07/25/2006   Anxiety state 07/25/2006   Depression with anxiety 07/25/2006   Essential hypertension 07/25/2006   IBS 07/25/2006   Polyphagia 07/25/2006   SKIN CANCER, HX OF 07/25/2006   Past Medical History:  Diagnosis Date   Anxiety    Cataract 2019   corrected with surgery   Depression    Diabetes mellitus without complication (HCC)    GERD (gastroesophageal reflux disease)    History of anxiety  History of cerebral aneurysm repair    history of cerebral aneurysm    History of depression    History of hyperglycemia    History of hyperlipidemia    History of hypothyroidism    History of melanoma    History of motion sickness    History of skin cancer    hx of melanoma left lower arm, recurrance   Hypercholesterolemia    Hypertension    Hypothyroidism    IBS (irritable bowel syndrome)    Nocturnal oxygen desaturation    PONV (postoperative nausea and vomiting)    Thyroid  disease    Past Surgical History:  Procedure Laterality Date   ABDOMINAL HYSTERECTOMY     APPENDECTOMY  1966   BREAST BIOPSY Left 07-29-14    core bx FIBROCYSTIC CHANGES WITH FEW MICROCALCIFICATIONS.   cardiolite  12/1997   normal EF 68%    CATARACT EXTRACTION W/ INTRAOCULAR LENS  IMPLANT, BILATERAL Bilateral 04/2017   [facility]   CEREBRAL ANEURYSM REPAIR  2008, 2009   coiling at [facility]   COLONOSCOPY WITH PROPOFOL  N/A 01/22/2018   Procedure: COLONOSCOPY WITH PROPOFOL ;  Surgeon: Toledo, Ladell POUR, MD;  Location: [facility] ENDOSCOPY;  Service: Gastroenterology;  Laterality: N/A;   EYE SURGERY     RR   FOOT SURGERY  02/1998   bilateral   INTRAOPERATIVE ARTERIOGRAM  10/17/2015   MOHS SURGERY  1992   TOE SURGERY Left 2015   TONSILLECTOMY  1964   TOTAL ABDOMINAL HYSTERECTOMY W/ BILATERAL SALPINGOOPHORECTOMY     Social History   Tobacco Use   Smoking status: Former    Passive exposure: Past   Smokeless tobacco: Never  Vaping Use   Vaping status: Never Used  Substance Use Topics   Alcohol use: Yes    Alcohol/week: 1.0 standard drink of alcohol    Types: 1 Standard drinks or equivalent per week    Comment: occasional wine   Drug use: No   Family History  Problem Relation Age of Onset   Diabetes Father    Depression Father    CAD Father    Diabetes Mother    Diverticulosis Mother    Adrenal disorder Mother    Breast cancer Other        distant relatives (all on mother's side)   Cerebral aneurysm Sister    Allergies  Allergen Reactions   Codeine Nausea And Vomiting    REACTION: nausea and vomiting   Lisinopril      Cough/throat symptoms    Latex Rash   Current Outpatient Medications on File Prior to Visit  Medication Sig Dispense Refill   amLODipine  (NORVASC ) 10 MG tablet TAKE 1 TABLET DAILY 90 tablet 1   aspirin EC 81 MG tablet Take 81 mg by mouth daily.     buPROPion  (WELLBUTRIN  XL) 300 MG 24 hr tablet TAKE 1 TABLET DAILY 90 tablet 1   calcium  carbonate (CALCIUM  600) 600 MG TABS tablet Take 1 tablet (600 mg total) by mouth 2 (two) times daily. 180 tablet 3   Cholecalciferol (VITAMIN D3) 50 MCG (2000 UT) capsule Take 1 capsule (2,000 Units total) by mouth daily. 90 capsule 3   FLUoxetine  (PROZAC ) 40 MG  capsule TAKE 2 CAPSULES DAILY 180 capsule 1   glucose blood (ONETOUCH ULTRA) test strip USE 1 STRIP TO CHECK GLUCOSE ONCE DAILYH AS DIRECTED (daignosis code: E11.9) 100 each 1   Homeopathic Products (ZICAM COLD REMEDY PO) Take by mouth.     hydrochlorothiazide  (HYDRODIURIL ) 25 MG tablet TAKE  1 TABLET DAILY 90 tablet 1   levothyroxine  (SYNTHROID ) 137 MCG tablet TAKE 1 TABLET DAILY BEFORE BREAKFAST 90 tablet 1   losartan  (COZAAR ) 100 MG tablet TAKE 1 TABLET DAILY 90 tablet 1   metFORMIN  (GLUCOPHAGE ) 500 MG tablet TAKE 1 TABLET TWICE A DAY WITH MEALS 180 tablet 1   omeprazole  (PRILOSEC) 20 MG capsule TAKE 1 CAPSULE DAILY 90 capsule 1   OneTouch Delica Lancets 33G MISC Check blood sugar twice a day and as directed. Dx E11.9 200 each 1   OZEMPIC , 1 MG/DOSE, 4 MG/3ML SOPN INJECT 1 MG UNDER THE SKIN ONE DAY A WEEK AS DIRECTED 3 mL 3   propranolol  ER (INDERAL  LA) 120 MG 24 hr capsule TAKE 1 CAPSULE DAILY 90 capsule 1   rosuvastatin  (CRESTOR ) 10 MG tablet Take 1 tablet (10 mg total) by mouth daily. 90 tablet 1   No current facility-administered medications on file prior to visit.    Review of Systems  Constitutional:  Negative for activity change, appetite change, fatigue, fever and unexpected weight change.  HENT:  Negative for congestion, ear pain, rhinorrhea, sinus pressure and sore throat.   Eyes:  Negative for pain, redness and visual disturbance.  Respiratory:  Negative for cough, shortness of breath and wheezing.   Cardiovascular:  Negative for chest pain and palpitations.  Gastrointestinal:  Negative for abdominal pain, blood in stool, constipation and diarrhea.  Endocrine: Negative for polydipsia and polyuria.  Genitourinary:  Negative for dysuria, frequency and urgency.  Musculoskeletal:  Negative for arthralgias, back pain and myalgias.  Skin:  Negative for pallor and rash.  Allergic/Immunologic: Negative for environmental allergies.  Neurological:  Negative for dizziness, syncope and  headaches.  Hematological:  Negative for adenopathy. Does not bruise/bleed easily.  Psychiatric/Behavioral:  Negative for decreased concentration and dysphoric mood. The patient is not nervous/anxious.        Objective:   Physical Exam Constitutional:      General: She is not in acute distress.    Appearance: Normal appearance. She is well-developed and normal weight. She is not ill-appearing or diaphoretic.  HENT:     Head: Normocephalic and atraumatic.  Eyes:     Conjunctiva/sclera: Conjunctivae normal.     Pupils: Pupils are equal, round, and reactive to light.  Neck:     Thyroid : No thyromegaly.     Vascular: No carotid bruit or JVD.  Cardiovascular:     Rate and Rhythm: Normal rate and regular rhythm.     Heart sounds: Normal heart sounds.     No gallop.  Pulmonary:     Effort: Pulmonary effort is normal. No respiratory distress.     Breath sounds: Normal breath sounds. No wheezing or rales.  Abdominal:     General: There is no distension or abdominal bruit.     Palpations: Abdomen is soft.  Musculoskeletal:     Cervical back: Normal range of motion and neck supple.     Right lower leg: No edema.     Left lower leg: No edema.  Lymphadenopathy:     Cervical: No cervical adenopathy.  Skin:    General: Skin is warm and dry.     Coloration: Skin is not pale.     Findings: No rash.  Neurological:     Mental Status: She is alert.     Coordination: Coordination normal.     Deep Tendon Reflexes: Reflexes are normal and symmetric. Reflexes normal.  Psychiatric:        Mood and  Affect: Mood normal.           Assessment & Plan:   Problem List Items Addressed This Visit       Cardiovascular and Mediastinum   Essential hypertension   bp in fair control at this time  BP Readings from Last 1 Encounters:  10/22/23 125/79   No changes needed Most recent labs reviewed  Disc lifstyle change with low sodium diet and exercise  Losartan  100 mg daily  Amlodipine  10 mg  daily  Hctz 25 mg daily  Propranolol  ER 120 mg daily       Relevant Orders   Comprehensive metabolic panel with GFR     Endocrine   Hyperlipidemia associated with type 2 diabetes mellitus (HCC)   Disc goals for lipids and reasons to control them Rev last labs with pt Rev low sat fat diet in detail  LDL of 85  Crestor  10 mg daily   Re check today        Relevant Orders   Comprehensive metabolic panel with GFR   Lipid panel   Diabetes type 2, controlled (HCC) - Primary   Lab Results  Component Value Date   HGBA1C 5.2 10/22/2023   HGBA1C 5.3 03/27/2023   HGBA1C 5.1 05/03/2022   Semaglutide  1 mg weekly -continues to loose weight and doing well / good exercise (will stay on this dose) Metformin  500 mg bid No hypoglycemia  Normal foot exam On arb and statin   Follow up annual exam in April         Other   Long-term (current) use of injectable non-insulin antidiabetic drugs   Continues semaglutide  Doing well      Other Visit Diagnoses       Controlled type 2 diabetes mellitus without complication, without long-term current use of insulin (HCC)       Relevant Orders   POCT HgB A1C (Completed)

## 2023-10-22 NOTE — Assessment & Plan Note (Signed)
 Lab Results  Component Value Date   HGBA1C 5.2 10/22/2023   HGBA1C 5.3 03/27/2023   HGBA1C 5.1 05/03/2022   Semaglutide  1 mg weekly -continues to loose weight and doing well / good exercise (will stay on this dose) Metformin  500 mg bid No hypoglycemia  Normal foot exam On arb and statin   Follow up annual exam in April

## 2023-10-22 NOTE — Assessment & Plan Note (Signed)
 bp in fair control at this time  BP Readings from Last 1 Encounters:  10/22/23 125/79   No changes needed Most recent labs reviewed  Disc lifstyle change with low sodium diet and exercise  Losartan  100 mg daily  Amlodipine  10 mg daily  Hctz 25 mg daily  Propranolol  ER 120 mg daily

## 2023-10-22 NOTE — Assessment & Plan Note (Signed)
 Continues semaglutide  Doing well

## 2023-11-21 ENCOUNTER — Encounter: Payer: Self-pay | Admitting: Emergency Medicine

## 2023-11-21 ENCOUNTER — Telehealth: Payer: Self-pay

## 2023-11-21 ENCOUNTER — Other Ambulatory Visit: Payer: Self-pay

## 2023-11-21 ENCOUNTER — Emergency Department
Admission: EM | Admit: 2023-11-21 | Discharge: 2023-11-21 | Disposition: A | Discharge status: Home / Self Care | Source: Ambulatory Visit | Attending: Emergency Medicine | Admitting: Emergency Medicine

## 2023-11-21 DIAGNOSIS — H1131 Conjunctival hemorrhage, right eye: Secondary | ICD-10-CM | POA: Insufficient documentation

## 2023-11-21 LAB — CBC
HCT: 38.5 % (ref 36.0–46.0)
Hemoglobin: 13 g/dL (ref 12.0–15.0)
MCH: 30.8 pg (ref 26.0–34.0)
MCHC: 33.8 g/dL (ref 30.0–36.0)
MCV: 91.2 fL (ref 80.0–100.0)
Platelets: 254 K/uL (ref 150–400)
RBC: 4.22 MIL/uL (ref 3.87–5.11)
RDW: 12.7 % (ref 11.5–15.5)
WBC: 7.6 K/uL (ref 4.0–10.5)
nRBC: 0 % (ref 0.0–0.2)

## 2023-11-21 LAB — COMPREHENSIVE METABOLIC PANEL WITH GFR
ALT: 18 U/L (ref 0–44)
AST: 22 U/L (ref 15–41)
Albumin: 3.7 g/dL (ref 3.5–5.0)
Alkaline Phosphatase: 35 U/L — ABNORMAL LOW (ref 38–126)
Anion gap: 12 (ref 5–15)
BUN: 22 mg/dL (ref 8–23)
CO2: 28 mmol/L (ref 22–32)
Calcium: 9.5 mg/dL (ref 8.9–10.3)
Chloride: 101 mmol/L (ref 98–111)
Creatinine, Ser: 0.77 mg/dL (ref 0.44–1.00)
GFR, Estimated: >60 mL/min (ref >60)
Glucose, Bld: 133 mg/dL — ABNORMAL HIGH (ref 70–99)
Potassium: 4.3 mmol/L (ref 3.5–5.1)
Sodium: 141 mmol/L (ref 135–145)
Total Bilirubin: 0.9 mg/dL (ref 0.0–1.2)
Total Protein: 7.1 g/dL (ref 6.5–8.1)

## 2023-11-21 NOTE — Telephone Encounter (Signed)
 Pt already has appt with Dr Randeen on 11/22/23 at 12:30. I spoke with pt; pt said last night on 11/20/23 pt blacked out for short time. Pt said she felt Weird pt has not blacked out before. Pt said her right eye is blood shot but has no pain in eyes and no blurred vision. [facility] neurology is following pt for aneurisms but pt has not been seen at [facility] since sometime in 2024., pt has no H/A but pt said with her hx she is concerned about blacking out last night and then the blood shot eye. I spoke with Joellen, team lead and she agreed with me pt should be evaluated today. No available appts at [facility] and pt is going to [facility] ED now. Pt wants to keep appt with Dr Randeen for tomorrow in case she needs FU otherwise pt will cb and cancel appt. Sending note to Dr tower and  [employer].

## 2023-11-21 NOTE — ED Provider Notes (Signed)
 [facility] Provider Note    Event Date/Time   First MD Initiated Contact with Patient 11/21/23 1514     (approximate)   History   Eye problem   HPI  Andrea Cooper is a 73 y.o. female who reports a history of multiple aneurysms to which have been coiled, never had an aneurysm rupture who presents with redness in her right eye when she woke up this morning.  She is concerned it could indicate something to do with an aneurysm.  No neurodeficits, feels quite well today, no headache.  Also questionable brief less than 1 second syncopal like episode yesterday, no palpitations or chest pain, no dizziness.  She reports this occurred while she was lying in bed     Physical Exam   Triage Vital Signs: ED Triage Vitals [11/21/23 1404]  Encounter Vitals Group     BP (!) 151/81     Girls Systolic BP Percentile      Girls Diastolic BP Percentile      Boys Systolic BP Percentile      Boys Diastolic BP Percentile      Pulse Rate 76     Resp 17     Temp 97.9 F (36.6 C)     Temp Source Oral     SpO2 95 %     Weight 65.8 kg (145 lb)     Height 1.549 m (5' 1)     Head Circumference      Peak Flow      Pain Score 0     Pain Loc      Pain Education      Exclude from Growth Chart     Most recent vital signs: Vitals:   11/21/23 1404 11/21/23 1515  BP: (!) 151/81 130/81  Pulse: 76 79  Resp: 17 18  Temp: 97.9 F (36.6 C)   SpO2: 95% 96%     General: Awake, no distress.  Kind and interactive CV:  Good peripheral perfusion.  Resp:  Normal effort.  Abd:  No distention.  Other:  Right eye: Medial subconjunctival hemorrhage Normal neurologic exam   ED Results / Procedures / Treatments   Labs (all labs ordered are listed, but only abnormal results are displayed) Labs Reviewed  COMPREHENSIVE METABOLIC PANEL WITH GFR - Abnormal; Notable for the following components:      Result Value   Glucose, Bld 133 (*)    Alkaline Phosphatase 35 (*)    All  other components within normal limits  CBC  URINALYSIS, ROUTINE W REFLEX MICROSCOPIC  CBG MONITORING, ED     EKG  ED ECG REPORT I, Lamar Price, the attending physician, personally viewed and interpreted this ECG.  Date: 11/21/2023  Rhythm: normal sinus rhythm QRS Axis: normal Intervals: Abnormal ST/T Wave abnormalities: normal Narrative Interpretation: no evidence of acute ischemia    RADIOLOGY     PROCEDURES:  Critical Care performed:   Procedures   MEDICATIONS ORDERED IN ED: Medications - No data to display   IMPRESSION / MDM / ASSESSMENT AND PLAN / ED COURSE  I reviewed the triage vital signs and the nursing notes. Patient's presentation is most consistent with acute illness / injury with system symptoms.  Patient presents with symptoms as above, overall well-appearing today and in no acute distress, EKG labs unremarkable.  Exam demonstrates subconjunctival hemorrhage, reassurance provided this is nothing to do with intracranial aneurysms, she is quite reassured by this.  We discussed questionable syncopal episode that  may have occurred yesterday, she will monitor, follow-up as an outpatient or return if it occurs again.        FINAL CLINICAL IMPRESSION(S) / ED DIAGNOSES   Final diagnoses:  Subconjunctival hemorrhage of right eye     Rx / DC Orders   ED Discharge Orders     None        Note:  This document was prepared using Dragon voice recognition software and may include unintentional dictation errors.   Arlander Charleston, MD 11/21/23 [phone]

## 2023-11-21 NOTE — Telephone Encounter (Signed)
 SABRA

## 2023-11-21 NOTE — ED Triage Notes (Signed)
 Patient to ED via POV for right eye problem. PT reports she woke up with blood in that eye. Concerned due to hx of brain aneurysm. States she also had a syncopal episode while laying down last night. Denies head injury or headache.

## 2023-11-21 NOTE — Telephone Encounter (Signed)
 Noted

## 2023-11-21 NOTE — Telephone Encounter (Signed)
 Copied from CRM [id number]. Topic: General - Other >> Nov 21, 2023  3:36 PM Frederich PARAS wrote: Reason for CRM: pt called to cancel apt on 11/6 , she says she went to the er instead and everything checked out good, I tried to schedule her for a hospital follow up. Pt declined.

## 2023-11-21 NOTE — Telephone Encounter (Signed)
 Pt is in ER now  Agree with disposition given symptoms , thanks  Will see tomorrow if she is released Pending correspondence

## 2023-11-22 ENCOUNTER — Ambulatory Visit: Admitting: Family Medicine

## 2023-12-02 ENCOUNTER — Other Ambulatory Visit: Payer: Self-pay | Admitting: Medical Genetics

## 2023-12-04 ENCOUNTER — Other Ambulatory Visit
Admission: RE | Admit: 2023-12-04 | Discharge: 2023-12-04 | Disposition: A | Discharge status: Home / Self Care | Payer: Self-pay | Source: Ambulatory Visit | Attending: Medical Genetics | Admitting: Medical Genetics

## 2023-12-10 ENCOUNTER — Other Ambulatory Visit: Payer: Self-pay | Admitting: Family Medicine

## 2023-12-17 LAB — GENECONNECT MOLECULAR SCREEN: Genetic Analysis Overall Interpretation: NEGATIVE

## 2024-01-29 ENCOUNTER — Ambulatory Visit: Admitting: Family Medicine

## 2024-01-29 ENCOUNTER — Ambulatory Visit: Payer: Self-pay | Admitting: Family Medicine

## 2024-01-29 ENCOUNTER — Encounter: Payer: Self-pay | Admitting: Family Medicine

## 2024-01-29 VITALS — BP 124/68 | HR 69 | Temp 97.6°F | Ht 61.0 in | Wt 163.0 lb

## 2024-01-29 DIAGNOSIS — E039 Hypothyroidism, unspecified: Secondary | ICD-10-CM | POA: Diagnosis not present

## 2024-01-29 DIAGNOSIS — Z7985 Long-term (current) use of injectable non-insulin antidiabetic drugs: Secondary | ICD-10-CM | POA: Diagnosis not present

## 2024-01-29 DIAGNOSIS — F418 Other specified anxiety disorders: Secondary | ICD-10-CM | POA: Diagnosis not present

## 2024-01-29 DIAGNOSIS — L659 Nonscarring hair loss, unspecified: Secondary | ICD-10-CM | POA: Diagnosis not present

## 2024-01-29 DIAGNOSIS — E1169 Type 2 diabetes mellitus with other specified complication: Secondary | ICD-10-CM

## 2024-01-29 DIAGNOSIS — N3000 Acute cystitis without hematuria: Secondary | ICD-10-CM | POA: Insufficient documentation

## 2024-01-29 DIAGNOSIS — I10 Essential (primary) hypertension: Secondary | ICD-10-CM

## 2024-01-29 DIAGNOSIS — R350 Frequency of micturition: Secondary | ICD-10-CM | POA: Insufficient documentation

## 2024-01-29 DIAGNOSIS — E785 Hyperlipidemia, unspecified: Secondary | ICD-10-CM | POA: Diagnosis not present

## 2024-01-29 DIAGNOSIS — E119 Type 2 diabetes mellitus without complications: Secondary | ICD-10-CM

## 2024-01-29 LAB — POC URINALSYSI DIPSTICK (AUTOMATED)
Bilirubin, UA: 1 — AB
Blood, UA: NEGATIVE
Glucose, UA: NEGATIVE
Ketones, UA: NEGATIVE
Nitrite, UA: NEGATIVE
Protein, UA: NEGATIVE
Spec Grav, UA: 1.02
Urobilinogen, UA: 0.2 U/dL
pH, UA: 6

## 2024-01-29 LAB — CBC WITH DIFFERENTIAL/PLATELET
Basophils Absolute: 0.1 K/uL (ref 0.0–0.1)
Basophils Relative: 0.9 % (ref 0.0–3.0)
Eosinophils Absolute: 0.3 K/uL (ref 0.0–0.7)
Eosinophils Relative: 3.5 % (ref 0.0–5.0)
HCT: 39.7 % (ref 36.0–46.0)
Hemoglobin: 13.2 g/dL (ref 12.0–15.0)
Lymphocytes Relative: 34.6 % (ref 12.0–46.0)
Lymphs Abs: 2.9 K/uL (ref 0.7–4.0)
MCHC: 33.3 g/dL (ref 30.0–36.0)
MCV: 91.3 fl (ref 78.0–100.0)
Monocytes Absolute: 0.6 K/uL (ref 0.1–1.0)
Monocytes Relative: 7.3 % (ref 3.0–12.0)
Neutro Abs: 4.5 K/uL (ref 1.4–7.7)
Neutrophils Relative %: 53.7 % (ref 43.0–77.0)
Platelets: 269 K/uL (ref 150.0–400.0)
RBC: 4.35 Mil/uL (ref 3.87–5.11)
RDW: 13.7 % (ref 11.5–15.5)
WBC: 8.3 K/uL (ref 4.0–10.5)

## 2024-01-29 LAB — COMPREHENSIVE METABOLIC PANEL WITH GFR
ALT: 18 U/L (ref 3–35)
AST: 18 U/L (ref 5–37)
Albumin: 3.9 g/dL (ref 3.5–5.2)
Alkaline Phosphatase: 41 U/L (ref 39–117)
BUN: 26 mg/dL — ABNORMAL HIGH (ref 6–23)
CO2: 31 meq/L (ref 19–32)
Calcium: 9.4 mg/dL (ref 8.4–10.5)
Chloride: 102 meq/L (ref 96–112)
Creatinine, Ser: 0.69 mg/dL (ref 0.40–1.20)
GFR: 85.08 mL/min
Glucose, Bld: 94 mg/dL (ref 70–99)
Potassium: 4.2 meq/L (ref 3.5–5.1)
Sodium: 141 meq/L (ref 135–145)
Total Bilirubin: 0.5 mg/dL (ref 0.2–1.2)
Total Protein: 6.7 g/dL (ref 6.0–8.3)

## 2024-01-29 LAB — HEMOGLOBIN A1C: Hgb A1c MFr Bld: 5.5 % (ref 4.6–6.5)

## 2024-01-29 LAB — TSH: TSH: 1.24 u[IU]/mL (ref 0.35–5.50)

## 2024-01-29 MED ORDER — CEPHALEXIN 500 MG PO CAPS: 500.0000 mg | ORAL_CAPSULE | Freq: Two times a day (BID) | ORAL | 0 refills | Status: AC

## 2024-01-29 MED ORDER — OZEMPIC (0.25 OR 0.5 MG/DOSE) 2 MG/3ML ~~LOC~~ SOPN: 0.5000 mg | PEN_INJECTOR | SUBCUTANEOUS | 0 refills | Status: AC

## 2024-01-29 NOTE — Assessment & Plan Note (Signed)
 Disc goals for lipids and reasons to control them Rev last labs with pt Rev low sat fat diet in detail LDL of 65 Continues crestor  10 mg daily

## 2024-01-29 NOTE — Patient Instructions (Addendum)
 Take the generic keflex  as directed  Drink lots of water  We will reach out with culture result when it returns   If symptoms worsen in the meantime please let us  know   Get your annual eye exam- have them sent us  a copy    Make sure to eat lean protein and keep exercising   Lab today   Start back on ozempic  0.5 mg weekly  Update us  in a month-if doing well will go to 1 mg

## 2024-01-29 NOTE — Assessment & Plan Note (Signed)
 Urinary frequency with dysuria  Positive urinalysis for wbc Culture pending Keflex  500 mg bid -pending culture result Instructed to call if symptoms worsen in meantime  Handout given

## 2024-01-29 NOTE — Assessment & Plan Note (Signed)
 TSH today  Levothyroxine  137 mcg daily   Some hair loss at crown  Otherwise no clinical changes

## 2024-01-29 NOTE — Progress Notes (Signed)
 "  Subjective:    Patient ID: Andrea Cooper, female    DOB: 14-Apr-1950, 73 y.o.   MRN: [id number]  HPI  Wt Readings from Last 3 Encounters:  01/29/24 163 lb (73.9 kg)  11/21/23 145 lb (65.8 kg)  10/22/23 148 lb 8 oz (67.4 kg)   30.80 kg/m  Vitals:   01/29/24 1051  BP: 124/68  Pulse: 69  Temp: 97.6 F (36.4 C)  SpO2: 96%     Pt presents for follow up of  HTN DM2 Hypothyroid  Urinary frequency / uti  Hair loss  Mood (dep/anx)   Highest weight was 204 lb in 2023   Urinary symptoms  Recent urination  Then some stinging with urination  Took over the counter azo like medicine - did help  Drinking lots of water  No blood in urine  No flank pain No nausea or fever   Results for orders placed or performed in visit on 01/29/24  POCT Urinalysis Dipstick (Automated)   Collection Time: 01/29/24 11:06 AM  Result Value Ref Range   Color, UA Yellow    Clarity, UA Clear    Glucose, UA Negative Negative   Bilirubin, UA 1 mg/dL (A)    Ketones, UA Negative    Spec Grav, UA 1.020 1.010 - 1.025   Blood, UA Negative    pH, UA 6.0 5.0 - 8.0   Protein, UA Negative Negative   Urobilinogen, UA 0.2 0.2 or 1.0 E.U./dL   Nitrite, UA Negative    Leukocytes, UA Large (3+) (A) Negative     HTN bp is stable today  No cp or palpitations or headaches or edema  No side effects to medicines  BP Readings from Last 3 Encounters:  01/29/24 124/68  11/21/23 130/81  10/22/23 125/79    Losartan  100 mg daily  Amlodipine  10 mg daily  Hydrochlorothiazide  25 mg daily  Propranolol  ER 120 mg daily   Lab Results  Component Value Date   NA 141 11/21/2023   K 4.3 11/21/2023   CO2 28 11/21/2023   GLUCOSE 133 (H) 11/21/2023   BUN 22 11/21/2023   CREATININE 0.77 11/21/2023   CALCIUM  9.5 11/21/2023   GFR 85.85 10/22/2023   GFRNONAA >60 11/21/2023      DM2 Lab Results  Component Value Date   HGBA1C 5.2 10/22/2023   HGBA1C 5.3 03/27/2023   HGBA1C 5.1 05/03/2022   Lab Results   Component Value Date   MICROALBUR 3.2 (H) 05/08/2023   MICROALBUR 1.8 07/09/2008   Semaglutide  1 mg weekly -stopped it for 4-6 weeks  Worried it was causing hair loss   Lost to 147 lb  Then back up to 161 off of it   No control over eating /appetite without it  Exercise regimen - 3 d per week at the gym - different equipment /strength training  Helps balance also    Metfomin  500 mg bid   Takes arb and statin    Hyperlipidemia Lab Results  Component Value Date   CHOL 142 10/22/2023   HDL 58.50 10/22/2023   LDLCALC 65 10/22/2023   LDLDIRECT 160.7 01/14/2007   TRIG 93.0 10/22/2023   CHOLHDL 2 10/22/2023   Crestor  10 mg daily    Hypothyroid Lab Results  Component Value Date   TSH 0.53 05/08/2023   Levothyroxine  137 mcg daily   No results found for: IRON, TIBC, FERRITIN Lab Results  Component Value Date   WBC 7.6 11/21/2023   HGB 13.0 11/21/2023  HCT 38.5 11/21/2023   MCV 91.2 11/21/2023   PLT 254 11/21/2023    Hair loss Started all over  Then more in the top/front  Got a hair piece and it helped  No scalp symptoms    Dep/anx  Mood     01/29/2024   11:00 AM 10/22/2023    8:41 AM 03/29/2023    9:41 AM 03/27/2022    8:31 AM 03/27/2022    8:30 AM  Depression screen PHQ 2/9  Decreased Interest 1 0 0 0 0  Down, Depressed, Hopeless 1 1 0 0 0  PHQ - 2 Score 2 1 0 0 0  Altered sleeping 1 2     Tired, decreased energy 1 1     Change in appetite 1 0     Feeling bad or failure about yourself  1 1     Trouble concentrating 1 1     Moving slowly or fidgety/restless 0 0     Suicidal thoughts 0 0     PHQ-9 Score 7 6      Difficult doing work/chores Not difficult at all Not difficult at all        Data saved with a previous flowsheet row definition      01/29/2024   11:00 AM 10/22/2023    8:42 AM  GAD 7 : Generalized Anxiety Score  Nervous, Anxious, on Edge 1 0  Control/stop worrying 1 1  Worry too much - different things 1 1  Trouble relaxing 1  1  Restless 1 0  Easily annoyed or irritable 1 0  Afraid - awful might happen 1 0  Total GAD 7 Score 7 3  Anxiety Difficulty Not difficult at all Not difficult at all   Fluoxetine  80 mg daily  Wellbutrin  xl 300 mg daily   Struggled a bit with a family wedding and then holidays Is getting back to normal now   No counseling right now -does not think needed Continues volunteer work at hospice      Patient Active Problem List   Diagnosis Date Noted   Acute cystitis 01/29/2024   Hair loss 01/29/2024   Urinary frequency 01/29/2024   Long-term (current) use of injectable non-insulin antidiabetic drugs 10/22/2023   Chest wall pain 05/15/2023   Localized swelling, mass, or lump of lower extremity 05/15/2023   Current use of proton pump inhibitor 03/23/2020   Allergic dermatitis 01/07/2018   Hearing loss 02/14/2016   Estrogen deficiency 02/14/2016   GERD (gastroesophageal reflux disease) 08/13/2015   Hypokalemia 12/15/2013   Encounter for screening mammogram for breast cancer 06/07/2011   Sleep apnea 09/13/2009   Overweight 01/13/2009   SWEATING 07/15/2008   Diabetes type 2, controlled (HCC) 05/22/2007   TOBACCO USE, QUIT 05/22/2007   CEREBRAL ANEURYSM 08/09/2006   Hypothyroidism 07/25/2006   Hyperlipidemia associated with type 2 diabetes mellitus (HCC) 07/25/2006   Anxiety state 07/25/2006   Depression with anxiety 07/25/2006   Essential hypertension 07/25/2006   IBS 07/25/2006   Polyphagia 07/25/2006   SKIN CANCER, HX OF 07/25/2006   Past Medical History:  Diagnosis Date   Anxiety    Cataract 2019   corrected with surgery   Depression    Diabetes mellitus without complication (HCC)    GERD (gastroesophageal reflux disease)    History of anxiety    History of cerebral aneurysm repair    history of cerebral aneurysm    History of depression    History of hyperglycemia    History of  hyperlipidemia    History of hypothyroidism    History of melanoma    History of  motion sickness    History of skin cancer    hx of melanoma left lower arm, recurrance   Hypercholesterolemia    Hypertension    Hypothyroidism    IBS (irritable bowel syndrome)    Nocturnal oxygen desaturation    PONV (postoperative nausea and vomiting)    Thyroid  disease    Past Surgical History:  Procedure Laterality Date   ABDOMINAL HYSTERECTOMY     APPENDECTOMY  1966   BREAST BIOPSY Left 07-29-14    core bx FIBROCYSTIC CHANGES WITH FEW MICROCALCIFICATIONS.   cardiolite  12/1997   normal EF 68%   CATARACT EXTRACTION W/ INTRAOCULAR LENS  IMPLANT, BILATERAL Bilateral 04/2017   [facility]   CEREBRAL ANEURYSM REPAIR  2008, 2009   coiling at [facility]   COLONOSCOPY WITH PROPOFOL  N/A 01/22/2018   Procedure: COLONOSCOPY WITH PROPOFOL ;  Surgeon: Toledo, Ladell POUR, MD;  Location: [facility] ENDOSCOPY;  Service: Gastroenterology;  Laterality: N/A;   EYE SURGERY     RR   FOOT SURGERY  02/1998   bilateral   INTRAOPERATIVE ARTERIOGRAM  10/17/2015   MOHS SURGERY  1992   TOE SURGERY Left 2015   TONSILLECTOMY  1964   TOTAL ABDOMINAL HYSTERECTOMY W/ BILATERAL SALPINGOOPHORECTOMY     Social History[1] Family History  Problem Relation Age of Onset   Diabetes Father    Depression Father    CAD Father    Diabetes Mother    Diverticulosis Mother    Adrenal disorder Mother    Breast cancer Other        distant relatives (all on mother's side)   Cerebral aneurysm Sister    Allergies[2] Medications Ordered Prior to Encounter[3]  Review of Systems  Constitutional:  Negative for activity change, appetite change, fatigue, fever and unexpected weight change.  HENT:  Negative for congestion, ear pain, rhinorrhea, sinus pressure and sore throat.   Eyes:  Negative for pain, redness and visual disturbance.  Respiratory:  Negative for cough, shortness of breath and wheezing.   Cardiovascular:  Negative for chest pain and palpitations.  Gastrointestinal:  Negative for abdominal pain, blood in stool,  constipation and diarrhea.  Endocrine: Negative for polydipsia and polyuria.  Genitourinary:  Negative for dysuria, frequency and urgency.  Musculoskeletal:  Negative for arthralgias, back pain and myalgias.  Skin:  Negative for pallor and rash.       Hair loss  Allergic/Immunologic: Negative for environmental allergies.  Neurological:  Negative for dizziness, syncope and headaches.  Hematological:  Negative for adenopathy. Does not bruise/bleed easily.  Psychiatric/Behavioral:  Positive for dysphoric mood. Negative for decreased concentration. The patient is nervous/anxious.        Objective:   Physical Exam Constitutional:      General: She is not in acute distress.    Appearance: Normal appearance. She is well-developed. She is obese. She is not ill-appearing or diaphoretic.  HENT:     Head: Normocephalic and atraumatic.  Eyes:     Conjunctiva/sclera: Conjunctivae normal.     Pupils: Pupils are equal, round, and reactive to light.  Neck:     Thyroid : No thyromegaly.     Vascular: No carotid bruit or JVD.  Cardiovascular:     Rate and Rhythm: Normal rate and regular rhythm.     Heart sounds: Normal heart sounds.     No gallop.  Pulmonary:     Effort: Pulmonary effort  is normal. No respiratory distress.     Breath sounds: Normal breath sounds. No wheezing or rales.  Abdominal:     General: There is no distension or abdominal bruit.     Palpations: Abdomen is soft.  Musculoskeletal:     Cervical back: Normal range of motion and neck supple.     Right lower leg: No edema.     Left lower leg: No edema.  Lymphadenopathy:     Cervical: No cervical adenopathy.  Skin:    General: Skin is warm and dry.     Coloration: Skin is not pale.     Findings: No rash.     Comments: Diffuse hair loss at crown  Receeding hair line  No scalp abnormalities   Neurological:     Mental Status: She is alert.     Coordination: Coordination normal.     Deep Tendon Reflexes: Reflexes are  normal and symmetric. Reflexes normal.  Psychiatric:        Mood and Affect: Mood normal.           Assessment & Plan:   Problem List Items Addressed This Visit       Cardiovascular and Mediastinum   Essential hypertension   bp in fair control at this time  BP Readings from Last 1 Encounters:  01/29/24 124/68   No changes needed Most recent labs reviewed  Disc lifstyle change with low sodium diet and exercise  Losartan  100 mg daily  Amlodipine  10 mg daily  Hctz 25 mg daily  Propranolol  ER 120 mg daily       Relevant Orders   Comprehensive metabolic panel with GFR   TSH   CBC with Differential/Platelet     Endocrine   Hypothyroidism   TSH today  Levothyroxine  137 mcg daily   Some hair loss at crown  Otherwise no clinical changes       Relevant Orders   TSH   Hyperlipidemia associated with type 2 diabetes mellitus (HCC)   Disc goals for lipids and reasons to control them Rev last labs with pt Rev low sat fat diet in detail LDL of 65 Continues crestor  10 mg daily      Relevant Medications   Semaglutide ,0.25 or 0.5MG /DOS, (OZEMPIC , 0.25 OR 0.5 MG/DOSE,) 2 MG/3ML SOPN   Diabetes type 2, controlled (HCC)   Was off glp-1 for 4-6 weeks Starting semaglutide  0.5 now-if well tolerated increase to 1 mg in a month   Suspect weight loss did cause some hair loss  Continues metfomrin 500 mg bid  A1c today      Relevant Medications   Semaglutide ,0.25 or 0.5MG /DOS, (OZEMPIC , 0.25 OR 0.5 MG/DOSE,) 2 MG/3ML SOPN   Other Relevant Orders   Hemoglobin A1c     Genitourinary   Acute cystitis - Primary   Urinary frequency with dysuria  Positive urinalysis for wbc Culture pending Keflex  500 mg bid -pending culture result Instructed to call if symptoms worsen in meantime  Handout given      Relevant Orders   Urine Culture     Other   Urinary frequency   Relevant Orders   POCT Urinalysis Dipstick (Automated) (Completed)   Long-term (current) use of  injectable non-insulin antidiabetic drugs   Starting back on ozempic  0.5 mg weekly  If well tolerated will go up to 1 mg in a month      Hair loss   Primarily in crown  Regressing hair line No focal alopecia  No scalp complaints  Likely  causes include large weight loss with glp-1, hypothyroid, change in stress and mood, age and hormone changes  Lab today  Healthy app scalp  Pt is wearing hair piece and not bothered by it   Will see her dermatologist in spring       Depression with anxiety   Scores are up for PHQ  7 Gad7    7   After some life changes (good and bad) Feeling back to normal and getting back to self care and exercise  Continues Fluoxetine  80 mg daily  Wellburin xl 300 mg daily            [1]  Social History Tobacco Use   Smoking status: Former    Passive exposure: Past   Smokeless tobacco: Never  Vaping Use   Vaping status: Never Used  Substance Use Topics   Alcohol use: Yes    Alcohol/week: 1.0 standard drink of alcohol    Types: 1 Standard drinks or equivalent per week    Comment: occasional wine   Drug use: No  [2]  Allergies Allergen Reactions   Codeine Nausea And Vomiting    REACTION: nausea and vomiting   Lisinopril      Cough/throat symptoms    Latex Rash  [3]  Current Outpatient Medications on File Prior to Visit  Medication Sig Dispense Refill   amLODipine  (NORVASC ) 10 MG tablet TAKE 1 TABLET DAILY 90 tablet 1   aspirin EC 81 MG tablet Take 81 mg by mouth daily.     buPROPion  (WELLBUTRIN  XL) 300 MG 24 hr tablet TAKE 1 TABLET DAILY 90 tablet 1   calcium  carbonate (CALCIUM  600) 600 MG TABS tablet Take 1 tablet (600 mg total) by mouth 2 (two) times daily. 180 tablet 3   Cholecalciferol (VITAMIN D3) 50 MCG (2000 UT) capsule Take 1 capsule (2,000 Units total) by mouth daily. 90 capsule 3   FLUoxetine  (PROZAC ) 40 MG capsule TAKE 2 CAPSULES DAILY 180 capsule 1   glucose blood (ONETOUCH ULTRA) test strip USE 1 STRIP TO CHECK GLUCOSE ONCE  DAILYH AS DIRECTED (daignosis code: E11.9) 100 each 1   Homeopathic Products (ZICAM COLD REMEDY PO) Take by mouth.     hydrochlorothiazide  (HYDRODIURIL ) 25 MG tablet TAKE 1 TABLET DAILY 90 tablet 1   levothyroxine  (SYNTHROID ) 137 MCG tablet TAKE 1 TABLET DAILY BEFORE BREAKFAST 90 tablet 1   losartan  (COZAAR ) 100 MG tablet TAKE 1 TABLET DAILY 90 tablet 1   metFORMIN  (GLUCOPHAGE ) 500 MG tablet TAKE 1 TABLET TWICE A DAY WITH MEALS 180 tablet 1   omeprazole  (PRILOSEC) 20 MG capsule TAKE 1 CAPSULE DAILY 90 capsule 1   OneTouch Delica Lancets 33G MISC Check blood sugar twice a day and as directed. Dx E11.9 200 each 1   propranolol  ER (INDERAL  LA) 120 MG 24 hr capsule TAKE 1 CAPSULE DAILY 90 capsule 1   rosuvastatin  (CRESTOR ) 10 MG tablet TAKE 1 TABLET DAILY 90 tablet 2   No current facility-administered medications on file prior to visit.   "

## 2024-01-29 NOTE — Assessment & Plan Note (Signed)
 Was off glp-1 for 4-6 weeks Starting semaglutide  0.5 now-if well tolerated increase to 1 mg in a month   Suspect weight loss did cause some hair loss  Continues metfomrin 500 mg bid  A1c today

## 2024-01-29 NOTE — Assessment & Plan Note (Signed)
 bp in fair control at this time  BP Readings from Last 1 Encounters:  01/29/24 124/68   No changes needed Most recent labs reviewed  Disc lifstyle change with low sodium diet and exercise  Losartan  100 mg daily  Amlodipine  10 mg daily  Hctz 25 mg daily  Propranolol  ER 120 mg daily

## 2024-01-29 NOTE — Assessment & Plan Note (Signed)
 Starting back on ozempic  0.5 mg weekly  If well tolerated will go up to 1 mg in a month

## 2024-01-29 NOTE — Assessment & Plan Note (Signed)
 Scores are up for PHQ  7 Gad7    7   After some life changes (good and bad) Feeling back to normal and getting back to self care and exercise  Continues Fluoxetine  80 mg daily  Wellburin xl 300 mg daily

## 2024-01-29 NOTE — Assessment & Plan Note (Signed)
 Primarily in crown  Regressing hair line No focal alopecia  No scalp complaints  Likely causes include large weight loss with glp-1, hypothyroid, change in stress and mood, age and hormone changes  Lab today  Healthy app scalp  Pt is wearing hair piece and not bothered by it   Will see her dermatologist in spring

## 2024-01-30 ENCOUNTER — Other Ambulatory Visit: Payer: Self-pay | Admitting: Family Medicine

## 2024-01-30 LAB — URINE CULTURE
MICRO NUMBER:: 17462187
Result:: NO GROWTH
SPECIMEN QUALITY:: ADEQUATE

## 2024-02-06 ENCOUNTER — Telehealth: Payer: Self-pay

## 2024-02-06 NOTE — Telephone Encounter (Signed)
 Called Patient left message to let patient know that message has been sent to follow up we will reach out as soon as we get answer.

## 2024-02-06 NOTE — Telephone Encounter (Signed)
 Copied from CRM #[id number]. Topic: Clinical - Medication Question >> Feb 06, 2024  1:10 PM Gustabo D wrote: Pt is needing a call back from Idell Czar about her tier reduction

## 2024-02-13 ENCOUNTER — Telehealth: Payer: Self-pay | Admitting: Family Medicine

## 2024-02-13 NOTE — Telephone Encounter (Signed)
 Copied from CRM #[id number]. Topic: General - Other >> Feb 13, 2024  4:25 PM Roselie BROCKS wrote: Reason for CRM: Patient calling for Idell Czar at clinic .Please reach out to patient once available.

## 2024-02-13 NOTE — Telephone Encounter (Unsigned)
 Copied from CRM #[id number]. Topic: Clinical - Medication Question >> Feb 06, 2024  1:10 PM Gustabo D wrote: Pt is needing a call back from Idell Czar about her tier reduction >> Feb 12, 2024  3:30 PM Rea ORN wrote: Pt called to follow up on this request. Please call back to advise, [phone]

## 2024-02-19 ENCOUNTER — Telehealth: Payer: Self-pay | Admitting: Family Medicine

## 2024-02-19 NOTE — Telephone Encounter (Signed)
 Copied from CRM #[id number]. Topic: General - Other >> Feb 13, 2024  4:25 PM Roselie BROCKS wrote: Reason for CRM: Patient calling for Andrea Cooper at clinic .Please reach out to patient once available.

## 2024-02-21 NOTE — Telephone Encounter (Signed)
 Andrea Cooper, this patient has been in contact with our office multiple times regarding her medication which I understand is waiting on a PA, is there any assistance you can give to help get this processed??  Thanks

## 2024-02-21 NOTE — Telephone Encounter (Signed)
 Pt is asking for Andrea Cooper because she sent a message to the PA dpt regarding trying to get a tier reduction on pt's Ozempic , will route to the PA dpt to see the status of this since I don't see any notes in pt's chart.

## 2024-02-22 NOTE — Telephone Encounter (Signed)
 Received fax saying Ozempic  PA was denied, faxed placed in PCP's inbox for review

## 2024-03-31 ENCOUNTER — Ambulatory Visit

## 2024-05-08 ENCOUNTER — Other Ambulatory Visit

## 2024-05-15 ENCOUNTER — Encounter: Admitting: Family Medicine
